# Patient Record
Sex: Male | Born: 1945
Health system: Southern US, Community
[De-identification: ages and names within clinical notes are randomized; demographics above are authoritative.]

## PROBLEM LIST (undated history)

## (undated) DIAGNOSIS — I8500 Esophageal varices without bleeding: Secondary | ICD-10-CM

## (undated) DIAGNOSIS — N3281 Overactive bladder: Secondary | ICD-10-CM

## (undated) DIAGNOSIS — I10 Essential (primary) hypertension: Secondary | ICD-10-CM

## (undated) DIAGNOSIS — I517 Cardiomegaly: Secondary | ICD-10-CM

## (undated) DIAGNOSIS — N62 Hypertrophy of breast: Secondary | ICD-10-CM

## (undated) DIAGNOSIS — R161 Splenomegaly, not elsewhere classified: Secondary | ICD-10-CM

## (undated) DIAGNOSIS — E039 Hypothyroidism, unspecified: Secondary | ICD-10-CM

## (undated) DIAGNOSIS — E785 Hyperlipidemia, unspecified: Secondary | ICD-10-CM

## (undated) DIAGNOSIS — K7460 Unspecified cirrhosis of liver: Secondary | ICD-10-CM

## (undated) DIAGNOSIS — E119 Type 2 diabetes mellitus without complications: Secondary | ICD-10-CM

## (undated) DIAGNOSIS — N529 Male erectile dysfunction, unspecified: Secondary | ICD-10-CM

## (undated) DIAGNOSIS — D696 Thrombocytopenia, unspecified: Secondary | ICD-10-CM

## (undated) DIAGNOSIS — D649 Anemia, unspecified: Secondary | ICD-10-CM

## (undated) DIAGNOSIS — I8290 Acute embolism and thrombosis of unspecified vein: Secondary | ICD-10-CM

## (undated) DIAGNOSIS — Z9289 Personal history of other medical treatment: Secondary | ICD-10-CM

## (undated) DIAGNOSIS — N400 Enlarged prostate without lower urinary tract symptoms: Secondary | ICD-10-CM

## (undated) DIAGNOSIS — K219 Gastro-esophageal reflux disease without esophagitis: Secondary | ICD-10-CM

## (undated) DIAGNOSIS — I2510 Atherosclerotic heart disease of native coronary artery without angina pectoris: Secondary | ICD-10-CM

## (undated) HISTORY — DX: Unspecified cirrhosis of liver: K74.60

## (undated) HISTORY — DX: Thrombocytopenia, unspecified: D69.6

## (undated) HISTORY — DX: Male erectile dysfunction, unspecified: N52.9

## (undated) HISTORY — PX: ROTATOR CUFF REPAIR: SHX139

## (undated) HISTORY — DX: Anemia, unspecified: D64.9

## (undated) HISTORY — DX: Overactive bladder: N32.81

## (undated) HISTORY — DX: Gastro-esophageal reflux disease without esophagitis: K21.9

## (undated) HISTORY — DX: Atherosclerotic heart disease of native coronary artery without angina pectoris: I25.10

## (undated) HISTORY — DX: Personal history of other medical treatment: Z92.89

## (undated) HISTORY — DX: Esophageal varices without bleeding: I85.00

## (undated) HISTORY — DX: Splenomegaly, not elsewhere classified: R16.1

## (undated) HISTORY — DX: Hypothyroidism, unspecified: E03.9

## (undated) HISTORY — DX: Hyperlipidemia, unspecified: E78.5

## (undated) HISTORY — DX: Type 2 diabetes mellitus without complications: E11.9

## (undated) HISTORY — DX: Hypertrophy of breast: N62

## (undated) HISTORY — DX: Acute embolism and thrombosis of unspecified vein: I82.90

---

## 2002-01-16 ENCOUNTER — Encounter: Admission: RE | Admit: 2002-01-16 | Discharge: 2002-04-16 | Payer: Self-pay | Admitting: Family Medicine

## 2002-01-23 ENCOUNTER — Encounter (HOSPITAL_COMMUNITY): Admission: RE | Admit: 2002-01-23 | Discharge: 2002-02-22 | Payer: Self-pay | Admitting: Oncology

## 2002-01-23 ENCOUNTER — Encounter (INDEPENDENT_AMBULATORY_CARE_PROVIDER_SITE_OTHER): Payer: Self-pay | Admitting: Internal Medicine

## 2002-01-23 ENCOUNTER — Encounter: Admission: RE | Admit: 2002-01-23 | Discharge: 2002-01-23 | Payer: Self-pay | Admitting: Oncology

## 2002-01-28 ENCOUNTER — Encounter (HOSPITAL_COMMUNITY): Payer: Self-pay | Admitting: Oncology

## 2002-03-07 DIAGNOSIS — I2510 Atherosclerotic heart disease of native coronary artery without angina pectoris: Secondary | ICD-10-CM

## 2002-03-07 HISTORY — PX: CORONARY ARTERY BYPASS GRAFT: SHX141

## 2002-03-07 HISTORY — DX: Atherosclerotic heart disease of native coronary artery without angina pectoris: I25.10

## 2002-03-18 ENCOUNTER — Encounter (HOSPITAL_COMMUNITY): Admission: RE | Admit: 2002-03-18 | Discharge: 2002-04-17 | Payer: Self-pay | Admitting: Oncology

## 2002-03-18 ENCOUNTER — Encounter: Admission: RE | Admit: 2002-03-18 | Discharge: 2002-03-18 | Payer: Self-pay | Admitting: Oncology

## 2002-04-26 ENCOUNTER — Ambulatory Visit (HOSPITAL_COMMUNITY): Admission: RE | Admit: 2002-04-26 | Discharge: 2002-04-26 | Payer: Self-pay | Admitting: Internal Medicine

## 2002-05-13 ENCOUNTER — Ambulatory Visit (HOSPITAL_COMMUNITY): Admission: RE | Admit: 2002-05-13 | Discharge: 2002-05-13 | Payer: Self-pay | Admitting: Internal Medicine

## 2002-05-13 ENCOUNTER — Encounter: Payer: Self-pay | Admitting: Internal Medicine

## 2002-06-11 ENCOUNTER — Encounter: Admission: RE | Admit: 2002-06-11 | Discharge: 2002-09-09 | Payer: Self-pay | Admitting: Family Medicine

## 2002-10-01 ENCOUNTER — Encounter (INDEPENDENT_AMBULATORY_CARE_PROVIDER_SITE_OTHER): Payer: Self-pay | Admitting: Internal Medicine

## 2002-10-03 ENCOUNTER — Inpatient Hospital Stay (HOSPITAL_COMMUNITY): Admission: AD | Admit: 2002-10-03 | Discharge: 2002-10-05 | Payer: Self-pay | Admitting: *Deleted

## 2002-10-15 ENCOUNTER — Inpatient Hospital Stay (HOSPITAL_COMMUNITY)
Admission: RE | Admit: 2002-10-15 | Discharge: 2002-10-19 | Payer: Self-pay | Admitting: Thoracic Surgery (Cardiothoracic Vascular Surgery)

## 2002-10-15 ENCOUNTER — Encounter: Payer: Self-pay | Admitting: Thoracic Surgery (Cardiothoracic Vascular Surgery)

## 2002-10-16 ENCOUNTER — Encounter: Payer: Self-pay | Admitting: Thoracic Surgery (Cardiothoracic Vascular Surgery)

## 2002-10-17 ENCOUNTER — Encounter: Payer: Self-pay | Admitting: Thoracic Surgery (Cardiothoracic Vascular Surgery)

## 2004-02-16 ENCOUNTER — Ambulatory Visit (HOSPITAL_COMMUNITY): Admission: RE | Admit: 2004-02-16 | Discharge: 2004-02-16 | Payer: Self-pay | Admitting: Family Medicine

## 2005-03-25 ENCOUNTER — Ambulatory Visit: Payer: Self-pay | Admitting: *Deleted

## 2005-05-12 ENCOUNTER — Inpatient Hospital Stay (HOSPITAL_COMMUNITY): Admission: EM | Admit: 2005-05-12 | Discharge: 2005-05-17 | Payer: Self-pay | Admitting: Emergency Medicine

## 2005-05-16 ENCOUNTER — Ambulatory Visit: Payer: Self-pay | Admitting: Internal Medicine

## 2005-05-24 ENCOUNTER — Ambulatory Visit: Payer: Self-pay | Admitting: Internal Medicine

## 2005-05-24 ENCOUNTER — Ambulatory Visit (HOSPITAL_COMMUNITY): Admission: RE | Admit: 2005-05-24 | Discharge: 2005-05-24 | Payer: Self-pay | Admitting: Internal Medicine

## 2005-05-27 ENCOUNTER — Ambulatory Visit: Payer: Self-pay | Admitting: Internal Medicine

## 2005-05-30 ENCOUNTER — Ambulatory Visit: Payer: Self-pay | Admitting: Internal Medicine

## 2005-05-30 ENCOUNTER — Ambulatory Visit: Payer: Self-pay | Admitting: *Deleted

## 2005-05-30 ENCOUNTER — Ambulatory Visit (HOSPITAL_COMMUNITY): Admission: RE | Admit: 2005-05-30 | Discharge: 2005-05-30 | Payer: Self-pay | Admitting: Internal Medicine

## 2005-05-31 ENCOUNTER — Ambulatory Visit: Payer: Self-pay | Admitting: Internal Medicine

## 2005-06-03 ENCOUNTER — Ambulatory Visit: Payer: Self-pay | Admitting: Internal Medicine

## 2005-06-08 ENCOUNTER — Encounter (INDEPENDENT_AMBULATORY_CARE_PROVIDER_SITE_OTHER): Payer: Self-pay | Admitting: Internal Medicine

## 2005-06-08 ENCOUNTER — Ambulatory Visit: Payer: Self-pay | Admitting: *Deleted

## 2005-06-21 ENCOUNTER — Ambulatory Visit (HOSPITAL_COMMUNITY): Admission: RE | Admit: 2005-06-21 | Discharge: 2005-06-21 | Payer: Self-pay | Admitting: *Deleted

## 2005-06-21 ENCOUNTER — Ambulatory Visit: Payer: Self-pay | Admitting: *Deleted

## 2005-06-22 ENCOUNTER — Ambulatory Visit (HOSPITAL_COMMUNITY): Admission: RE | Admit: 2005-06-22 | Discharge: 2005-06-22 | Payer: Self-pay | Admitting: Internal Medicine

## 2005-06-22 ENCOUNTER — Encounter (INDEPENDENT_AMBULATORY_CARE_PROVIDER_SITE_OTHER): Payer: Self-pay | Admitting: Internal Medicine

## 2005-06-23 ENCOUNTER — Ambulatory Visit: Payer: Self-pay | Admitting: *Deleted

## 2005-06-23 ENCOUNTER — Encounter (INDEPENDENT_AMBULATORY_CARE_PROVIDER_SITE_OTHER): Payer: Self-pay | Admitting: Internal Medicine

## 2005-06-24 ENCOUNTER — Ambulatory Visit: Payer: Self-pay | Admitting: Internal Medicine

## 2005-07-14 ENCOUNTER — Ambulatory Visit: Payer: Self-pay | Admitting: Internal Medicine

## 2005-08-11 ENCOUNTER — Ambulatory Visit: Payer: Self-pay | Admitting: Internal Medicine

## 2005-09-15 ENCOUNTER — Ambulatory Visit: Payer: Self-pay | Admitting: Internal Medicine

## 2005-09-15 LAB — CONVERTED CEMR LAB
Microalb Creat Ratio: 20.6 mg/g
Microalb, Ur: 2.42 mg/dL

## 2005-09-28 ENCOUNTER — Ambulatory Visit: Payer: Self-pay | Admitting: *Deleted

## 2005-10-27 ENCOUNTER — Ambulatory Visit: Payer: Self-pay | Admitting: Internal Medicine

## 2005-11-15 ENCOUNTER — Encounter (INDEPENDENT_AMBULATORY_CARE_PROVIDER_SITE_OTHER): Payer: Self-pay | Admitting: Internal Medicine

## 2005-11-23 ENCOUNTER — Ambulatory Visit: Payer: Self-pay | Admitting: Internal Medicine

## 2005-11-24 ENCOUNTER — Ambulatory Visit (HOSPITAL_COMMUNITY): Admission: RE | Admit: 2005-11-24 | Discharge: 2005-11-24 | Payer: Self-pay | Admitting: Internal Medicine

## 2005-11-24 ENCOUNTER — Ambulatory Visit: Payer: Self-pay | Admitting: Internal Medicine

## 2005-12-01 ENCOUNTER — Encounter (INDEPENDENT_AMBULATORY_CARE_PROVIDER_SITE_OTHER): Payer: Self-pay | Admitting: Internal Medicine

## 2005-12-14 ENCOUNTER — Encounter (INDEPENDENT_AMBULATORY_CARE_PROVIDER_SITE_OTHER): Payer: Self-pay | Admitting: Internal Medicine

## 2005-12-14 ENCOUNTER — Ambulatory Visit: Payer: Self-pay | Admitting: Cardiology

## 2005-12-22 ENCOUNTER — Ambulatory Visit: Payer: Self-pay | Admitting: Internal Medicine

## 2005-12-22 ENCOUNTER — Ambulatory Visit (HOSPITAL_COMMUNITY): Admission: RE | Admit: 2005-12-22 | Discharge: 2005-12-22 | Payer: Self-pay | Admitting: Internal Medicine

## 2005-12-22 ENCOUNTER — Encounter (INDEPENDENT_AMBULATORY_CARE_PROVIDER_SITE_OTHER): Payer: Self-pay | Admitting: Internal Medicine

## 2006-01-19 ENCOUNTER — Ambulatory Visit: Payer: Self-pay | Admitting: Internal Medicine

## 2006-01-19 LAB — CONVERTED CEMR LAB: Hgb A1c MFr Bld: 6.9 %

## 2006-02-01 DIAGNOSIS — I8500 Esophageal varices without bleeding: Secondary | ICD-10-CM | POA: Insufficient documentation

## 2006-02-01 DIAGNOSIS — E785 Hyperlipidemia, unspecified: Secondary | ICD-10-CM | POA: Insufficient documentation

## 2006-02-01 DIAGNOSIS — D649 Anemia, unspecified: Secondary | ICD-10-CM

## 2006-02-01 DIAGNOSIS — K7460 Unspecified cirrhosis of liver: Secondary | ICD-10-CM | POA: Insufficient documentation

## 2006-02-01 DIAGNOSIS — R161 Splenomegaly, not elsewhere classified: Secondary | ICD-10-CM

## 2006-02-01 DIAGNOSIS — K7200 Acute and subacute hepatic failure without coma: Secondary | ICD-10-CM

## 2006-02-01 DIAGNOSIS — E039 Hypothyroidism, unspecified: Secondary | ICD-10-CM | POA: Insufficient documentation

## 2006-02-01 DIAGNOSIS — I2510 Atherosclerotic heart disease of native coronary artery without angina pectoris: Secondary | ICD-10-CM | POA: Insufficient documentation

## 2006-03-27 ENCOUNTER — Ambulatory Visit: Payer: Self-pay | Admitting: Internal Medicine

## 2006-03-28 ENCOUNTER — Encounter (INDEPENDENT_AMBULATORY_CARE_PROVIDER_SITE_OTHER): Payer: Self-pay | Admitting: Internal Medicine

## 2006-03-28 LAB — CONVERTED CEMR LAB
AST: 30 units/L (ref 0–37)
Albumin: 3.5 g/dL (ref 3.5–5.2)
Alkaline Phosphatase: 76 units/L (ref 39–117)
BUN: 17 mg/dL (ref 6–23)
Basophils Relative: 0 % (ref 0–1)
CO2: 23 meq/L (ref 19–32)
Calcium: 8.8 mg/dL (ref 8.4–10.5)
Chloride: 103 meq/L (ref 96–112)
Cholesterol: 113 mg/dL (ref 0–200)
Eosinophils Relative: 2 % (ref 0–5)
Hemoglobin: 12.4 g/dL — ABNORMAL LOW (ref 13.0–17.0)
Lymphocytes Relative: 18 % (ref 12–46)
MCHC: 33.4 g/dL (ref 30.0–36.0)
MCV: 92.8 fL (ref 78.0–100.0)
Monocytes Absolute: 0.5 10*3/uL (ref 0.2–0.7)
Monocytes Relative: 9 % (ref 3–11)
RBC: 4 M/uL — ABNORMAL LOW (ref 4.22–5.81)
RDW: 15.3 % — ABNORMAL HIGH (ref 11.5–14.0)
Triglycerides: 55 mg/dL (ref <150)
VLDL: 11 mg/dL (ref 0–40)

## 2006-04-14 ENCOUNTER — Ambulatory Visit (HOSPITAL_COMMUNITY): Admission: RE | Admit: 2006-04-14 | Discharge: 2006-04-14 | Payer: Self-pay | Admitting: Internal Medicine

## 2006-04-14 ENCOUNTER — Ambulatory Visit: Payer: Self-pay | Admitting: Internal Medicine

## 2006-04-24 ENCOUNTER — Ambulatory Visit: Payer: Self-pay | Admitting: Internal Medicine

## 2006-04-24 DIAGNOSIS — F528 Other sexual dysfunction not due to a substance or known physiological condition: Secondary | ICD-10-CM

## 2006-04-24 LAB — CONVERTED CEMR LAB: Hgb A1c MFr Bld: 7.3 %

## 2006-05-11 ENCOUNTER — Encounter (INDEPENDENT_AMBULATORY_CARE_PROVIDER_SITE_OTHER): Payer: Self-pay | Admitting: Internal Medicine

## 2006-05-12 LAB — CONVERTED CEMR LAB
Albumin: 3.5 g/dL (ref 3.5–5.2)
Total Bilirubin: 1.1 mg/dL (ref 0.3–1.2)
Total Protein: 7.2 g/dL (ref 6.0–8.3)

## 2006-05-24 ENCOUNTER — Encounter (INDEPENDENT_AMBULATORY_CARE_PROVIDER_SITE_OTHER): Payer: Self-pay | Admitting: *Deleted

## 2006-07-10 ENCOUNTER — Ambulatory Visit: Payer: Self-pay | Admitting: Internal Medicine

## 2006-07-10 LAB — CONVERTED CEMR LAB: Hgb A1c MFr Bld: 7.3 %

## 2006-07-11 ENCOUNTER — Encounter (INDEPENDENT_AMBULATORY_CARE_PROVIDER_SITE_OTHER): Payer: Self-pay | Admitting: Internal Medicine

## 2006-07-11 LAB — CONVERTED CEMR LAB
AST: 36 units/L (ref 0–37)
Alkaline Phosphatase: 101 units/L (ref 39–117)
Basophils Absolute: 0 10*3/uL (ref 0.0–0.1)
Basophils Relative: 0 % (ref 0–1)
Glucose, Bld: 172 mg/dL — ABNORMAL HIGH (ref 70–99)
HCT: 36.7 % — ABNORMAL LOW (ref 39.0–52.0)
Lymphocytes Relative: 21 % (ref 12–46)
Monocytes Absolute: 0.6 10*3/uL (ref 0.2–0.7)
Neutro Abs: 3.1 10*3/uL (ref 1.7–7.7)
Neutrophils Relative %: 65 % (ref 43–77)
Platelets: 65 10*3/uL — ABNORMAL LOW (ref 150–400)
RBC: 4.06 M/uL — ABNORMAL LOW (ref 4.22–5.81)
RDW: 15.4 % — ABNORMAL HIGH (ref 11.5–14.0)
TSH: 10.221 microintl units/mL — ABNORMAL HIGH (ref 0.350–5.50)
Total Bilirubin: 1.2 mg/dL (ref 0.3–1.2)
Total Protein: 7 g/dL (ref 6.0–8.3)

## 2006-07-14 ENCOUNTER — Ambulatory Visit: Payer: Self-pay | Admitting: Cardiology

## 2006-07-14 ENCOUNTER — Encounter (INDEPENDENT_AMBULATORY_CARE_PROVIDER_SITE_OTHER): Payer: Self-pay | Admitting: Internal Medicine

## 2006-07-17 ENCOUNTER — Encounter (INDEPENDENT_AMBULATORY_CARE_PROVIDER_SITE_OTHER): Payer: Self-pay | Admitting: Internal Medicine

## 2006-07-17 ENCOUNTER — Ambulatory Visit: Payer: Self-pay | Admitting: Internal Medicine

## 2006-07-20 ENCOUNTER — Ambulatory Visit (HOSPITAL_COMMUNITY): Admission: RE | Admit: 2006-07-20 | Discharge: 2006-07-20 | Payer: Self-pay | Admitting: Cardiology

## 2006-07-20 ENCOUNTER — Ambulatory Visit (HOSPITAL_COMMUNITY): Admission: RE | Admit: 2006-07-20 | Discharge: 2006-07-20 | Payer: Self-pay | Admitting: Internal Medicine

## 2006-08-23 ENCOUNTER — Ambulatory Visit (HOSPITAL_COMMUNITY): Admission: RE | Admit: 2006-08-23 | Discharge: 2006-08-23 | Payer: Self-pay | Admitting: Internal Medicine

## 2006-10-09 HISTORY — PX: CARDIAC CATHETERIZATION: SHX172

## 2006-10-10 ENCOUNTER — Encounter (INDEPENDENT_AMBULATORY_CARE_PROVIDER_SITE_OTHER): Payer: Self-pay | Admitting: Internal Medicine

## 2006-10-11 ENCOUNTER — Ambulatory Visit: Payer: Self-pay | Admitting: Internal Medicine

## 2006-10-11 LAB — CONVERTED CEMR LAB: Hgb A1c MFr Bld: 7.4 %

## 2006-10-12 ENCOUNTER — Ambulatory Visit: Payer: Self-pay | Admitting: Internal Medicine

## 2006-11-01 ENCOUNTER — Telehealth (INDEPENDENT_AMBULATORY_CARE_PROVIDER_SITE_OTHER): Payer: Self-pay | Admitting: Internal Medicine

## 2006-11-07 ENCOUNTER — Encounter (INDEPENDENT_AMBULATORY_CARE_PROVIDER_SITE_OTHER): Payer: Self-pay | Admitting: Internal Medicine

## 2006-12-04 ENCOUNTER — Ambulatory Visit: Payer: Self-pay | Admitting: Internal Medicine

## 2006-12-04 DIAGNOSIS — L570 Actinic keratosis: Secondary | ICD-10-CM | POA: Insufficient documentation

## 2006-12-04 LAB — CONVERTED CEMR LAB: Creatinine, Urine: 151.4 mg/dL

## 2007-01-01 ENCOUNTER — Encounter (INDEPENDENT_AMBULATORY_CARE_PROVIDER_SITE_OTHER): Payer: Self-pay | Admitting: Internal Medicine

## 2007-01-08 ENCOUNTER — Ambulatory Visit (HOSPITAL_COMMUNITY): Admission: RE | Admit: 2007-01-08 | Discharge: 2007-01-08 | Payer: Self-pay | Admitting: Internal Medicine

## 2007-01-08 ENCOUNTER — Ambulatory Visit: Payer: Self-pay | Admitting: Internal Medicine

## 2007-01-08 DIAGNOSIS — R17 Unspecified jaundice: Secondary | ICD-10-CM | POA: Insufficient documentation

## 2007-01-08 DIAGNOSIS — R109 Unspecified abdominal pain: Secondary | ICD-10-CM

## 2007-01-08 LAB — CONVERTED CEMR LAB
Albumin: 3.3 g/dL — ABNORMAL LOW (ref 3.5–5.2)
BUN: 15 mg/dL (ref 6–23)
Basophils Absolute: 0 10*3/uL (ref 0.0–0.1)
Basophils Relative: 0 % (ref 0–1)
CO2: 27 meq/L (ref 19–32)
Eosinophils Absolute: 0.2 10*3/uL (ref 0.0–0.7)
Glucose, Bld: 190 mg/dL — ABNORMAL HIGH (ref 70–99)
HCT: 36.3 % — ABNORMAL LOW (ref 39.0–52.0)
INR: 1.3 (ref 0.0–1.5)
MCHC: 35.4 g/dL (ref 30.0–36.0)
MCV: 95 fL (ref 78.0–100.0)
Monocytes Absolute: 0.8 10*3/uL — ABNORMAL HIGH (ref 0.2–0.7)
Neutrophils Relative %: 74 % (ref 43–77)
Platelets: 63 10*3/uL — ABNORMAL LOW (ref 150–400)
Potassium: 4.1 meq/L (ref 3.5–5.3)
Prothrombin Time: 17 s — ABNORMAL HIGH (ref 11.6–15.2)
RBC: 3.82 M/uL — ABNORMAL LOW (ref 4.22–5.81)
RDW: 13.9 % (ref 11.5–14.0)
Total Bilirubin: 2.9 mg/dL — ABNORMAL HIGH (ref 0.3–1.2)
Vitamin B-12: 340 pg/mL (ref 211–911)
WBC: 7.4 10*3/uL (ref 4.0–10.5)

## 2007-01-10 ENCOUNTER — Encounter (INDEPENDENT_AMBULATORY_CARE_PROVIDER_SITE_OTHER): Payer: Self-pay | Admitting: Internal Medicine

## 2007-01-11 ENCOUNTER — Ambulatory Visit: Payer: Self-pay | Admitting: Internal Medicine

## 2007-01-11 LAB — CONVERTED CEMR LAB: Blood Glucose, Fingerstick: 173

## 2007-01-12 ENCOUNTER — Telehealth (INDEPENDENT_AMBULATORY_CARE_PROVIDER_SITE_OTHER): Payer: Self-pay | Admitting: *Deleted

## 2007-01-12 LAB — CONVERTED CEMR LAB
AST: 31 units/L (ref 0–37)
Albumin: 3.5 g/dL (ref 3.5–5.2)
Bilirubin, Direct: 0.4 mg/dL — ABNORMAL HIGH (ref 0.0–0.3)
Indirect Bilirubin: 1 mg/dL — ABNORMAL HIGH (ref 0.0–0.9)
Total Protein: 6.8 g/dL (ref 6.0–8.3)

## 2007-01-15 ENCOUNTER — Encounter (INDEPENDENT_AMBULATORY_CARE_PROVIDER_SITE_OTHER): Payer: Self-pay | Admitting: Internal Medicine

## 2007-03-13 ENCOUNTER — Ambulatory Visit: Payer: Self-pay | Admitting: Internal Medicine

## 2007-03-13 LAB — CONVERTED CEMR LAB
Blood Glucose, Fingerstick: 143
INR: 1.4 (ref 0.0–1.5)
Prothrombin Time: 17.8 s — ABNORMAL HIGH (ref 11.6–15.2)

## 2007-03-14 ENCOUNTER — Telehealth (INDEPENDENT_AMBULATORY_CARE_PROVIDER_SITE_OTHER): Payer: Self-pay | Admitting: *Deleted

## 2007-03-14 LAB — CONVERTED CEMR LAB
Albumin: 3.7 g/dL (ref 3.5–5.2)
Alkaline Phosphatase: 62 units/L (ref 39–117)
BUN: 23 mg/dL (ref 6–23)
Basophils Absolute: 0 10*3/uL (ref 0.0–0.1)
Basophils Relative: 0 % (ref 0–1)
CO2: 20 meq/L (ref 19–32)
Calcium: 9.5 mg/dL (ref 8.4–10.5)
Cholesterol: 130 mg/dL (ref 0–200)
Creatinine, Ser: 0.92 mg/dL (ref 0.40–1.50)
Eosinophils Relative: 2 % (ref 0–5)
HCT: 37.8 % — ABNORMAL LOW (ref 39.0–52.0)
HDL: 34 mg/dL — ABNORMAL LOW (ref >39)
LDL Cholesterol: 76 mg/dL (ref 0–99)
MCHC: 36 g/dL (ref 30.0–36.0)
Monocytes Absolute: 0.4 10*3/uL (ref 0.1–1.0)
Neutrophils Relative %: 63 % (ref 43–77)
Potassium: 4.8 meq/L (ref 3.5–5.3)
RBC: 4.08 M/uL — ABNORMAL LOW (ref 4.22–5.81)
Triglycerides: 102 mg/dL (ref <150)

## 2007-03-15 ENCOUNTER — Encounter (INDEPENDENT_AMBULATORY_CARE_PROVIDER_SITE_OTHER): Payer: Self-pay | Admitting: Internal Medicine

## 2007-05-03 ENCOUNTER — Encounter (INDEPENDENT_AMBULATORY_CARE_PROVIDER_SITE_OTHER): Payer: Self-pay | Admitting: Internal Medicine

## 2007-05-03 ENCOUNTER — Ambulatory Visit: Payer: Self-pay | Admitting: Internal Medicine

## 2007-05-08 ENCOUNTER — Ambulatory Visit (HOSPITAL_COMMUNITY): Admission: RE | Admit: 2007-05-08 | Discharge: 2007-05-08 | Payer: Self-pay | Admitting: Internal Medicine

## 2007-05-11 ENCOUNTER — Ambulatory Visit: Payer: Self-pay | Admitting: Internal Medicine

## 2007-05-11 DIAGNOSIS — M6530 Trigger finger, unspecified finger: Secondary | ICD-10-CM | POA: Insufficient documentation

## 2007-05-11 LAB — CONVERTED CEMR LAB: Hgb A1c MFr Bld: 7.5 %

## 2007-08-09 ENCOUNTER — Ambulatory Visit: Payer: Self-pay | Admitting: Internal Medicine

## 2007-08-09 LAB — CONVERTED CEMR LAB
Hgb A1c MFr Bld: 7.3 %
Prothrombin Time: 18.1 s — ABNORMAL HIGH (ref 11.6–15.2)
aPTT: 38 s — ABNORMAL HIGH (ref 24–37)

## 2007-08-10 LAB — CONVERTED CEMR LAB
ALT: 20 units/L (ref 0–53)
AST: 36 units/L (ref 0–37)
Alkaline Phosphatase: 75 units/L (ref 39–117)
CO2: 18 meq/L — ABNORMAL LOW (ref 19–32)
Chloride: 106 meq/L (ref 96–112)
Creatinine, Ser: 0.89 mg/dL (ref 0.40–1.50)
Hemoglobin: 13.3 g/dL (ref 13.0–17.0)
LDL Cholesterol: 77 mg/dL (ref 0–99)
Lymphocytes Relative: 14 % (ref 12–46)
MCHC: 35.7 g/dL (ref 30.0–36.0)
Neutrophils Relative %: 77 % (ref 43–77)
Potassium: 4.9 meq/L (ref 3.5–5.3)
RDW: 14.9 % (ref 11.5–15.5)
Total Protein: 7.6 g/dL (ref 6.0–8.3)

## 2007-11-08 ENCOUNTER — Ambulatory Visit: Payer: Self-pay | Admitting: Internal Medicine

## 2007-11-08 LAB — CONVERTED CEMR LAB
Blood Glucose, Fingerstick: 155
Hgb A1c MFr Bld: 7.3 %

## 2007-11-16 LAB — CONVERTED CEMR LAB
AFP-Tumor Marker: 2.4 ng/mL (ref 0.0–8.0)
ALT: 20 U/L (ref 0–53)
AST: 34 U/L (ref 0–37)
Albumin: 3.6 g/dL (ref 3.5–5.2)
Alkaline Phosphatase: 89 U/L (ref 39–117)
BUN: 18 mg/dL (ref 6–23)
CO2: 21 meq/L (ref 19–32)
Calcium: 9 mg/dL (ref 8.4–10.5)
Chloride: 111 meq/L (ref 96–112)
Creatinine, Ser: 0.87 mg/dL (ref 0.40–1.50)
Glucose, Bld: 142 mg/dL — ABNORMAL HIGH (ref 70–99)
Potassium: 4.6 meq/L (ref 3.5–5.3)
Sodium: 141 meq/L (ref 135–145)
Total Bilirubin: 1.6 mg/dL — ABNORMAL HIGH (ref 0.3–1.2)
Total Protein: 7.1 g/dL (ref 6.0–8.3)

## 2007-12-26 ENCOUNTER — Ambulatory Visit: Payer: Self-pay | Admitting: Internal Medicine

## 2008-02-07 ENCOUNTER — Ambulatory Visit: Payer: Self-pay | Admitting: Internal Medicine

## 2008-02-07 LAB — CONVERTED CEMR LAB: INR: 1.4 (ref 0.0–1.5)

## 2008-02-21 ENCOUNTER — Encounter (INDEPENDENT_AMBULATORY_CARE_PROVIDER_SITE_OTHER): Payer: Self-pay | Admitting: Internal Medicine

## 2008-05-05 LAB — CONVERTED CEMR LAB
Albumin: 3.8 g/dL (ref 3.5–5.2)
CO2: 24 meq/L (ref 19–32)
Cholesterol: 118 mg/dL (ref 0–200)
Eosinophils Relative: 3 % (ref 0–5)
Glucose, Bld: 191 mg/dL — ABNORMAL HIGH (ref 70–99)
HCT: 37.2 % — ABNORMAL LOW (ref 39.0–52.0)
Lymphocytes Relative: 20 % (ref 12–46)
Lymphs Abs: 1.2 10*3/uL (ref 0.7–4.0)
Microalb Creat Ratio: 11.7 mg/g (ref 0.0–30.0)
Platelets: 81 10*3/uL — ABNORMAL LOW (ref 150–400)
Potassium: 4.5 meq/L (ref 3.5–5.3)
RBC: 3.99 M/uL — ABNORMAL LOW (ref 4.22–5.81)
Sodium: 138 meq/L (ref 135–145)
Total Protein: 7.5 g/dL (ref 6.0–8.3)
Triglycerides: 60 mg/dL (ref <150)
WBC: 5.8 10*3/uL (ref 4.0–10.5)

## 2008-05-08 ENCOUNTER — Ambulatory Visit: Payer: Self-pay | Admitting: Internal Medicine

## 2008-05-08 DIAGNOSIS — L821 Other seborrheic keratosis: Secondary | ICD-10-CM

## 2008-05-08 LAB — CONVERTED CEMR LAB: Blood Glucose, Fingerstick: 171

## 2008-08-06 ENCOUNTER — Ambulatory Visit: Payer: Self-pay | Admitting: Internal Medicine

## 2008-08-06 LAB — CONVERTED CEMR LAB: Blood Glucose, Fingerstick: 176

## 2008-08-07 LAB — CONVERTED CEMR LAB
Alkaline Phosphatase: 86 units/L (ref 39–117)
Basophils Relative: 0 % (ref 0–1)
Creatinine, Ser: 0.81 mg/dL (ref 0.40–1.50)
Glucose, Bld: 171 mg/dL — ABNORMAL HIGH (ref 70–99)
LDL Cholesterol: 47 mg/dL (ref 0–99)
Lymphs Abs: 1.1 10*3/uL (ref 0.7–4.0)
Monocytes Relative: 8 % (ref 3–12)
Neutro Abs: 3.7 10*3/uL (ref 1.7–7.7)
Neutrophils Relative %: 68 % (ref 43–77)
Prothrombin Time: 18.3 s — ABNORMAL HIGH (ref 11.6–15.2)
RBC: 3.89 M/uL — ABNORMAL LOW (ref 4.22–5.81)
Sodium: 140 meq/L (ref 135–145)
Total Bilirubin: 2.2 mg/dL — ABNORMAL HIGH (ref 0.3–1.2)
Total CHOL/HDL Ratio: 2.3
Total Protein: 7.4 g/dL (ref 6.0–8.3)
Triglycerides: 75 mg/dL (ref <150)
VLDL: 15 mg/dL (ref 0–40)
WBC: 5.4 10*3/uL (ref 4.0–10.5)
aPTT: 38 s — ABNORMAL HIGH (ref 24–37)

## 2008-08-29 ENCOUNTER — Encounter (INDEPENDENT_AMBULATORY_CARE_PROVIDER_SITE_OTHER): Payer: Self-pay | Admitting: Internal Medicine

## 2008-11-07 ENCOUNTER — Ambulatory Visit: Payer: Self-pay | Admitting: Internal Medicine

## 2008-11-07 DIAGNOSIS — E079 Disorder of thyroid, unspecified: Secondary | ICD-10-CM | POA: Insufficient documentation

## 2008-11-07 DIAGNOSIS — E119 Type 2 diabetes mellitus without complications: Secondary | ICD-10-CM | POA: Insufficient documentation

## 2008-11-07 DIAGNOSIS — H60339 Swimmer's ear, unspecified ear: Secondary | ICD-10-CM | POA: Insufficient documentation

## 2008-11-07 LAB — CONVERTED CEMR LAB: Hgb A1c MFr Bld: 6.9 %

## 2009-07-23 ENCOUNTER — Ambulatory Visit (HOSPITAL_COMMUNITY): Admission: RE | Admit: 2009-07-23 | Discharge: 2009-07-23 | Payer: Self-pay | Admitting: Ophthalmology

## 2009-12-22 ENCOUNTER — Encounter: Payer: Self-pay | Admitting: Internal Medicine

## 2010-01-04 ENCOUNTER — Ambulatory Visit: Payer: Self-pay | Admitting: Internal Medicine

## 2010-01-05 ENCOUNTER — Ambulatory Visit: Payer: Self-pay | Admitting: Internal Medicine

## 2010-01-05 ENCOUNTER — Encounter: Payer: Self-pay | Admitting: Gastroenterology

## 2010-01-05 HISTORY — PX: ESOPHAGOGASTRODUODENOSCOPY: SHX1529

## 2010-01-05 HISTORY — PX: COLONOSCOPY: SHX174

## 2010-01-06 ENCOUNTER — Encounter: Payer: Self-pay | Admitting: Internal Medicine

## 2010-01-06 LAB — CONVERTED CEMR LAB
AFP-Tumor Marker: 2.4 ng/mL (ref 0.0–8.0)
Eosinophils Relative: 3 % (ref 0–5)
Ferritin: 129 ng/mL (ref 22–322)
HCT: 34.2 % — ABNORMAL LOW (ref 39.0–52.0)
Hemoglobin: 11.9 g/dL — ABNORMAL LOW (ref 13.0–17.0)
Lymphocytes Relative: 26 % (ref 12–46)
Lymphs Abs: 1.1 10*3/uL (ref 0.7–4.0)
Monocytes Absolute: 0.4 10*3/uL (ref 0.1–1.0)
Monocytes Relative: 9 % (ref 3–12)
Neutro Abs: 2.6 10*3/uL (ref 1.7–7.7)
RBC: 3.54 M/uL — ABNORMAL LOW (ref 4.22–5.81)
Saturation Ratios: 40 % (ref 20–55)
WBC: 4.2 10*3/uL (ref 4.0–10.5)

## 2010-01-14 ENCOUNTER — Ambulatory Visit (HOSPITAL_COMMUNITY): Admission: RE | Admit: 2010-01-14 | Discharge: 2010-01-14 | Payer: Self-pay | Admitting: Internal Medicine

## 2010-01-14 ENCOUNTER — Telehealth: Payer: Self-pay | Admitting: Gastroenterology

## 2010-01-25 ENCOUNTER — Ambulatory Visit: Payer: Self-pay | Admitting: Internal Medicine

## 2010-01-25 ENCOUNTER — Ambulatory Visit (HOSPITAL_COMMUNITY): Admission: RE | Admit: 2010-01-25 | Discharge: 2010-01-25 | Payer: Self-pay | Admitting: Internal Medicine

## 2010-04-06 ENCOUNTER — Encounter (INDEPENDENT_AMBULATORY_CARE_PROVIDER_SITE_OTHER): Payer: Self-pay | Admitting: *Deleted

## 2010-04-06 NOTE — Letter (Signed)
Summary: TCS/EGD ORDER  TCS/EGD ORDER   Imported By: Ave Filter 01/06/2010 15:01:34  _____________________________________________________________________  External Attachment:    Type:   Image     Comment:   External Document

## 2010-04-06 NOTE — Letter (Signed)
Summary: ABD U/S ORDER  ABD U/S ORDER   Imported By: Ave Filter 01/06/2010 15:02:13  _____________________________________________________________________  External Attachment:    Type:   Image     Comment:   External Document

## 2010-04-06 NOTE — Progress Notes (Signed)
Summary: abd u/s  ---- Converted from flag ---- ---- 01/14/2010 9:57 AM, R. Roetta Sessions MD, Caleen Essex wrote: if asx, nothing else to be done- he would be a HIGH risk surgical candidate  ---- 01/14/2010 9:26 AM, Leanna Battles. Sumer Moorehouse PA-C wrote: GB wall thick on u/s in setting of cirrhosis. Pt assymptomatic. U/S done for hepatoma surveillance only. Anything else need to be done? ------------------------------

## 2010-04-06 NOTE — Assessment & Plan Note (Signed)
Summary: DROPPED OFF STOOL/SS   Pt returned one iFOBT and it was positive.     Allergies: No Known Drug Allergies  Other Orders: Immuno-chemical Fecal Occult ([address])  Appended Document: DROPPED OFF STOOL/SS see lab addendum

## 2010-04-06 NOTE — Assessment & Plan Note (Signed)
Summary: HEMOGLOBIN LOW/LAW   Visit Type:  consult Referring Provider:  Dwana Melena Primary Care Provider:  Dwana Melena  Chief Complaint:  low hemoglobin/f/u cirrhosis.  History of Present Illness: [name] is a pleasant 65 y/o WM, with h/o NASH cirrhosis, who presents at request of Dr. Timothy Lasso hall for further evaluation of cirrhosis and anemia. He was last seen by our practice in 2/09. He has h/o portal HTN, extensive intra-abdominal and esophageal varices s/p serial EGDs with EBL obliteration. H/O SMV thrombosis, GERD, DM, CAD with CABG. He was seen on several occasions at [facility] for his cirrhosis. According to the patient, he has not been seen in a couple of years and he reports being told he is not transplant candidate due to other medical problems. Our last correspondence from [facility] was in 2008 and impression stated that CAD would be issue with transplant but there was also issue with his insurance not being accepted at that time.  Patient had recent labs with Dr. Margo Aye. His MET-7 was normal. H/H 11.7/33.3, MCV 95.4, Plt 47,000 (stable), Tbili 2.2, AP 73, AST 39, ALT 20, alb 3.5, TSH 0.986, HgbA1C 6.0.  He has regular BMs. No melena, brbpr. No abd pain. Good appetite. No heartburn. No dysphagia, n/v. Weight stable. Some lower extremity swelling. Walks about 2 hours in a row on concrete at work. Swelling better in am. No itching. No nosebleeds.   Current Medications (verified): 1)  Nadolol 20 Mg  Tabs (Nadolol) .Marland Kitchen.. 1 By Mouth Once Daily 2)  Synthroid 125 Mcg  Tabs (Levothyroxine Sodium) .Marland Kitchen.. 1 By Mouth Once Daily 3)  Lisinopril 2.5 Mg Tabs (Lisinopril) .Marland Kitchen.. 1 By Mouth Once Daily 4)  Spironolactone 25 Mg Tabs (Spironolactone) .... 1/2 By Mouth Two Times A Day 5)  Isosorbide Mononitrate Cr 30 Mg Tb24 (Isosorbide Mononitrate) .Marland Kitchen.. 1 By Mouth Once Daily 6)  Januvia 100 Mg Tabs (Sitagliptin Phosphate) .Marland Kitchen.. 1 By Mouth Once Daily 7)  Lipitor 10 Mg Tabs (Atorvastatin Calcium) .... 1/2 By Mouth Once  Daily 8)  Accu-Chek Compact Test Drum   Strp (Glucose Blood) .... Fsbs Four Times Daily 9)  Lantus Solostar 100 Unit/ml  Soln (Insulin Glargine) .Marland Kitchen.. 10 Units Subcutaneously At Bedtime, Titrate Up Per Physician Order To 50 Units If Needed  Allergies (verified): No Known Drug Allergies  Past History:  Past Surgical History: Last updated: 10/11/2006 Coronary artery bypass graft-2004 Rotator cuff repair, left-2004 EDG with variceal banding X 5 Cardiac Cath 10/09/06  Past Medical History: Anemia/thrombocytopenia Blood transfusion, hx Cirrhosis-end stage liver disease, NASH Coronary artery disease s/p CAGB 2004 Hyperlipidemia Hypothyroidism GERD Hypothyroidism NIIDM atherosclerosis overactive bladder erectile dysfunction right gynecomastia Vaccinated for Hep A and Hep B TCS 2/04--> scattered left-sided diverticula, prominent vascular congestion left colon, normal TI Last EGD 2/08, Dr. Isabella Stalling columns of grade 2 EVs, s/p band ligation, portal gastropathy  Family History: father-deceased-79-CVA mother-deceased 85-CHF brother-60-CABG, [place] No FH of liver, CRC.  children-    male-40    male-38    male-37    male-25  Social History: Married lives with second wife Former Smoker 1-2 ppd for 15 years quit age 44 Alcohol use-yes--less than 1 beer per month, never heavy drink Drug use-no Occupation: security work  Review of Systems General:  Denies fever, chills, sweats, anorexia, fatigue, weakness, and weight loss. Eyes:  Denies vision loss. ENT:  Denies nasal congestion, sore throat, hoarseness, and difficulty swallowing. CV:  Complains of peripheral edema; denies chest pains, angina, palpitations, and dyspnea on exertion. Resp:  Denies dyspnea at rest, dyspnea with exercise, cough, sputum, and wheezing. GI:  See HPI. GU:  Denies urinary burning and blood in urine. MS:  Denies joint pain / LOM. Derm:  Denies rash and itching. Neuro:  Denies weakness, frequent  headaches, memory loss, and confusion. Psych:  Denies depression and anxiety. Endo:  Denies unusual weight change. Heme:  Denies bruising and bleeding. Allergy:  Denies hives and rash.  Vital Signs:  Patient profile:   65 year old male Height:      67 inches Weight:      188 pounds BMI:     29.55 Temp:     97.6 degrees F oral Pulse rate:   68 / minute BP sitting:   124 / 78  (left arm) Cuff size:   regular  Vitals Entered By: Hendricks Limes LPN (January 04, 2010 9:35 AM)  Physical Exam  General:  Well developed, well nourished, no acute distress. Head:  Normocephalic and atraumatic. Eyes:  Conjunctivae pink, no scleral icterus.  Mouth:  Oropharyngeal mucosa moist, pink.  No lesions, erythema or exudate.    Neck:  Supple; no masses or thyromegaly. Lungs:  Clear throughout to auscultation. Heart:  Regular rate and rhythm; no murmurs, rubs,  or bruits. Abdomen:  Liver edge prominent. Spleen tip palpable? No masses, abd bruit, ascites. NT. No hernia. Extremities:  No clubbing, cyanosis, edema or deformities noted. Neurologic:  Alert and  oriented x4;  grossly normal neurologically. Skin:  Intact without significant lesions or rashes. Cervical Nodes:  No significant cervical adenopathy. Psych:  Alert and cooperative. Normal mood and affect.  Impression & Recommendations:  Problem # 1:  ANEMIA-NOS (ICD-285.9) Anemia and thrombocytopenia in setting of cirrhosis although H/H below the baseline we had in 2009. No overt GI bleeding. Cannot exclude occult GI bleeding. Check ifobt. Check iron studies. He is due for EGD with possible EV banding but will await ifobt results first. If positive or evidence of IDA, then consider TCS at same time.  Orders: T-CBC w/Diff [phone]) T-Ferritin [phone]) T-Iron [phone]) T-Iron Binding Capacity (TIBC) ([id number]) Consultation Level IV (13244)  Problem # 2:  CIRRHOSIS (ICD-571.5) NASH cirrhosis with h/o SMV thrombosis, esophageal  varices, portal gastropathy. He is overdue for surveillance testing. He states he was told he was not transplant candidate due to comorbidities but we did not receive correspondence stating that per say. I discussed the necessity that he f/u with hepatologist at least every six months, but he doesn't seem to understand the significance. Will check MELD labs, AFP. He will need imaging of liver in near future (await labs, likely due abd u/s).  Orders: T-CBC w/Diff ([id number]) T-PT (Prothrombin Time) (44034) T-AFP Tumor Markers ([id number]) Consultation Level IV (56433) I would like to thank Dr. Dwana Melena for allowing [place] to take part in the care of this nice patient.

## 2010-04-06 NOTE — Letter (Signed)
Summary: LABS FROM DR Margo Aye  LABS FROM DR HALL   Imported By: Rexene Alberts 12/22/2009 11:12:49  _____________________________________________________________________  External Attachment:    Type:   Image     Comment:   External Document

## 2010-04-11 ENCOUNTER — Emergency Department (HOSPITAL_COMMUNITY): Payer: BC Managed Care – PPO

## 2010-04-11 ENCOUNTER — Emergency Department (HOSPITAL_COMMUNITY)
Admission: EM | Admit: 2010-04-11 | Discharge: 2010-04-11 | Disposition: A | Discharge status: Home / Self Care | Payer: BC Managed Care – PPO | Attending: Emergency Medicine | Admitting: Emergency Medicine

## 2010-04-11 DIAGNOSIS — Z79899 Other long term (current) drug therapy: Secondary | ICD-10-CM | POA: Insufficient documentation

## 2010-04-11 DIAGNOSIS — R42 Dizziness and giddiness: Secondary | ICD-10-CM | POA: Insufficient documentation

## 2010-04-11 DIAGNOSIS — R5381 Other malaise: Secondary | ICD-10-CM | POA: Insufficient documentation

## 2010-04-11 DIAGNOSIS — E119 Type 2 diabetes mellitus without complications: Secondary | ICD-10-CM | POA: Insufficient documentation

## 2010-04-11 DIAGNOSIS — K7460 Unspecified cirrhosis of liver: Secondary | ICD-10-CM | POA: Insufficient documentation

## 2010-04-11 DIAGNOSIS — R4182 Altered mental status, unspecified: Secondary | ICD-10-CM | POA: Insufficient documentation

## 2010-04-11 DIAGNOSIS — N63 Unspecified lump in unspecified breast: Secondary | ICD-10-CM

## 2010-04-11 LAB — POCT CARDIAC MARKERS
CKMB, poc: <1 ng/mL — ABNORMAL LOW (ref 1.0–8.0)
Myoglobin, poc: 79.2 ng/mL (ref 12–200)
Troponin i, poc: <0.05 ng/mL (ref 0.00–0.09)

## 2010-04-11 LAB — URINALYSIS, ROUTINE W REFLEX MICROSCOPIC
Bilirubin Urine: NEGATIVE
Ketones, ur: NEGATIVE mg/dL
Nitrite: NEGATIVE
Protein, ur: NEGATIVE mg/dL
Urobilinogen, UA: 1 mg/dL (ref 0.0–1.0)

## 2010-04-11 LAB — BASIC METABOLIC PANEL
BUN: 10 mg/dL (ref 6–23)
CO2: 25 mEq/L (ref 19–32)
Chloride: 111 mEq/L (ref 96–112)
Creatinine, Ser: 0.85 mg/dL (ref 0.4–1.5)
Potassium: 3.6 mEq/L (ref 3.5–5.1)

## 2010-04-11 LAB — CBC
Hemoglobin: 11.9 g/dL — ABNORMAL LOW (ref 13.0–17.0)
MCH: 34.5 pg — ABNORMAL HIGH (ref 26.0–34.0)
MCHC: 38.1 g/dL — ABNORMAL HIGH (ref 30.0–36.0)
Platelets: 47 10*3/uL — ABNORMAL LOW (ref 150–400)

## 2010-04-11 LAB — DIFFERENTIAL
Basophils Absolute: 0 10*3/uL (ref 0.0–0.1)
Basophils Relative: 0 % (ref 0–1)
Eosinophils Absolute: 0.2 10*3/uL (ref 0.0–0.7)
Lymphocytes Relative: 20 % (ref 12–46)
Monocytes Absolute: 0.5 10*3/uL (ref 0.1–1.0)
Neutro Abs: 3.3 10*3/uL (ref 1.7–7.7)
Neutrophils Relative %: 68 % (ref 43–77)

## 2010-04-11 LAB — RAPID URINE DRUG SCREEN, HOSP PERFORMED
Amphetamines: NOT DETECTED
Benzodiazepines: NOT DETECTED
Opiates: NOT DETECTED
Tetrahydrocannabinol: NOT DETECTED

## 2010-04-11 LAB — GLUCOSE, CAPILLARY: Glucose-Capillary: 146 mg/dL — ABNORMAL HIGH (ref 70–99)

## 2010-04-14 NOTE — Letter (Signed)
Summary: Recall Office Visit  [facility]  [address]   [place], [place] [address]   Phone: [phone]  Fax: [phone]      April 06, 2010   URHO RIO [address] [facility] [address] [place], [place]  [address] March 01, 1946   Dear Mr. HOON,   According to our records, it is time for you to schedule a follow-up office visit with [place].   At your convenience, please call [phone] to schedule an office visit. If you have any questions, concerns, or feel that this letter is in error, we would appreciate your call.   Sincerely,    Diana Eves  [facility] Ph: [phone]   Fax: [phone]

## 2010-04-21 ENCOUNTER — Ambulatory Visit (HOSPITAL_COMMUNITY)
Admit: 2010-04-21 | Discharge: 2010-04-21 | Disposition: A | Discharge status: Home / Self Care | Payer: BC Managed Care – PPO | Attending: Internal Medicine | Admitting: Internal Medicine

## 2010-04-21 DIAGNOSIS — N644 Mastodynia: Secondary | ICD-10-CM | POA: Insufficient documentation

## 2010-04-21 DIAGNOSIS — N63 Unspecified lump in unspecified breast: Secondary | ICD-10-CM | POA: Insufficient documentation

## 2010-05-04 ENCOUNTER — Encounter: Payer: Self-pay | Admitting: Internal Medicine

## 2010-05-04 ENCOUNTER — Encounter: Payer: Self-pay | Admitting: Gastroenterology

## 2010-05-04 ENCOUNTER — Ambulatory Visit (INDEPENDENT_AMBULATORY_CARE_PROVIDER_SITE_OTHER): Payer: BC Managed Care – PPO | Admitting: Gastroenterology

## 2010-05-04 DIAGNOSIS — I8500 Esophageal varices without bleeding: Secondary | ICD-10-CM

## 2010-05-04 DIAGNOSIS — K7460 Unspecified cirrhosis of liver: Secondary | ICD-10-CM

## 2010-05-10 ENCOUNTER — Encounter: Payer: Self-pay | Admitting: Gastroenterology

## 2010-05-12 ENCOUNTER — Encounter: Payer: Self-pay | Admitting: Internal Medicine

## 2010-05-13 ENCOUNTER — Encounter: Payer: BC Managed Care – PPO | Admitting: Internal Medicine

## 2010-05-13 ENCOUNTER — Ambulatory Visit (HOSPITAL_COMMUNITY)
Admission: RE | Admit: 2010-05-13 | Discharge: 2010-05-13 | Disposition: A | Discharge status: Home / Self Care | Payer: BC Managed Care – PPO | Source: Ambulatory Visit | Attending: Internal Medicine | Admitting: Internal Medicine

## 2010-05-13 DIAGNOSIS — I8500 Esophageal varices without bleeding: Secondary | ICD-10-CM

## 2010-05-13 DIAGNOSIS — Z79899 Other long term (current) drug therapy: Secondary | ICD-10-CM | POA: Insufficient documentation

## 2010-05-13 DIAGNOSIS — E119 Type 2 diabetes mellitus without complications: Secondary | ICD-10-CM | POA: Insufficient documentation

## 2010-05-13 DIAGNOSIS — K319 Disease of stomach and duodenum, unspecified: Secondary | ICD-10-CM

## 2010-05-13 DIAGNOSIS — K7460 Unspecified cirrhosis of liver: Secondary | ICD-10-CM | POA: Insufficient documentation

## 2010-05-13 DIAGNOSIS — I8510 Secondary esophageal varices without bleeding: Secondary | ICD-10-CM | POA: Insufficient documentation

## 2010-05-13 DIAGNOSIS — Z794 Long term (current) use of insulin: Secondary | ICD-10-CM | POA: Insufficient documentation

## 2010-05-13 DIAGNOSIS — I10 Essential (primary) hypertension: Secondary | ICD-10-CM | POA: Insufficient documentation

## 2010-05-13 LAB — GLUCOSE, CAPILLARY: Glucose-Capillary: 118 mg/dL — ABNORMAL HIGH (ref 70–99)

## 2010-05-13 NOTE — Assessment & Plan Note (Addendum)
Summary: PP FU IN 3 MONTHS/LAW   Vital Signs:  Patient profile:   65 year old male Height:      67 inches Weight:      184.50 pounds BMI:     29.00 Temp:     98.3 degrees F oral Pulse rate:   64 / minute BP sitting:   120 / 58  (left arm)  Vitals Entered By: Nathan Clines LPN (May 04, 2010 9:02 AM)  Visit Type:  Follow-up Visit Primary Care Provider:  Dwana Melena   History of Present Illness: [name] presents today in f/u for NASH cirrhosis, anemia. Had an EGD/colon in Nov 2011 with 4 columns of 2-3 esophageal varices with banding, portal gastropathy, 2 non-bleeding AVMs in sigmoid, portal colopathy, with rec's to reassess UGI via EGD in 3 mos.  No dyspagia/odynophagia. No overt signs of GI bleed. BM daily. No abdominal pain. No jaundice, one episode of confusion 2/5, went to ED. no ammonia level drawn at that time; pt states he f/u with Dr. Margo Aye, who ordered an ammonia level.  Last [place] and AFP done Nov 2011; due for repeat [facility] screening in May 2012.   Labs from 04/11/10: H/H: 11.9/31.2, Plt 47, Na 140, K 3.6, Cr 0.85, BUN 10   Current Medications (verified): 1)  Nadolol 20 Mg  Tabs (Nadolol) .Marland Kitchen.. 1 By Mouth Once Daily 2)  Synthroid 125 Mcg  Tabs (Levothyroxine Sodium) .Marland Kitchen.. 1 By Mouth Once Daily 3)  Lisinopril 2.5 Mg Tabs (Lisinopril) .Marland Kitchen.. 1 By Mouth Once Daily 4)  Spironolactone 25 Mg Tabs (Spironolactone) .... 1/2 By Mouth Two Times A Day 5)  Isosorbide Mononitrate Cr 30 Mg Tb24 (Isosorbide Mononitrate) .Marland Kitchen.. 1 By Mouth Once Daily 6)  Januvia 100 Mg Tabs (Sitagliptin Phosphate) .Marland Kitchen.. 1 By Mouth Once Daily 7)  Lipitor 10 Mg Tabs (Atorvastatin Calcium) .... 1/2 By Mouth Once Daily 8)  Accu-Chek Compact Test Drum   Strp (Glucose Blood) .... Fsbs Four Times Daily 9)  Lantus Solostar 100 Unit/ml  Soln (Insulin Glargine) .Marland Kitchen.. 10 Units Subcutaneously At Bedtime, Titrate Up Per Physician Order To 50 Units If Needed  Allergies (verified): No Known Drug Allergies  Past History:  Past  Medical History: Anemia/thrombocytopenia Blood transfusion, hx Cirrhosis-end stage liver disease, NASH Coronary artery disease s/p CAGB 2004 Hyperlipidemia Hypothyroidism GERD Hypothyroidism NIIDM atherosclerosis overactive bladder erectile dysfunction right gynecomastia Vaccinated for Hep A and Hep B TCS 2/04--> scattered left-sided diverticula, prominent vascular congestion left colon, normal TI EGD 2/08, Dr. Isabella Stalling columns of grade 2 EVs, s/p band ligation, portal gastropathy Nov 2011 EGD/colon: 4 columns 2-3 grade esophageal varices, s/p banding, portal gastropathy, 2 non-bleeding AVMs in sigmoid, portal colopathy  Past Surgical History: Reviewed history from 10/11/2006 and no changes required. Coronary artery bypass graft-2004 Rotator cuff repair, left-2004 EDG with variceal banding X 5 Cardiac Cath 10/09/06  Family History: Reviewed history from 01/04/2010 and no changes required. father-deceased-79-CVA mother-deceased 85-CHF brother-60-CABG, [place] No FH of liver, CRC.  children-    male-40    male-38    male-37    male-25  Social History: Reviewed history from 01/04/2010 and no changes required. Married lives with second wife Former Smoker 1-2 ppd for 15 years quit age 48 Alcohol use-yes--less than 1 beer per month, never heavy drink Drug use-no Occupation: security work  Physical Exam  General:  Well developed, well nourished, no acute distress. Head:  Normocephalic and atraumatic. Eyes:  PERRLA, no icterus. Lungs:  Clear throughout to auscultation. Heart:  Regular rate and rhythm; no murmurs, rubs,  or bruits. Abdomen:  +BS, soft, non-tender, non-distended, no HSM, no rebound or guarding. Msk:  Symmetrical with no gross deformities. Normal posture. Neurologic:  Alert and  oriented x4;  grossly normal neurologically. Skin:  Intact without significant lesions or rashes. Psych:  Alert and cooperative. Normal mood and affect.   Impression &  Recommendations:  Problem # 1:  ESOPHAGEAL VARICES (ICD-77.62)  65 year old Caucasian male with hx of esophageal varices, most recent EGD in Nov 2011 with 4 columns of Grade 2-3 esophageal varices, s/p banding, portal gastropathy. Rec's to reassess via EGD in 3 mos. Denies abdominal pain, dysphagia/odynophagia. Hx of anemia with last H/H 11.9/31.2. Most recent colonoscopy in Nov 2011. No overt signs of GI bleed.  EGD with Dr. Jena Gauss in near future: the R/B/A have been discussed in detail; pt states understanding and desires to proceed Hold Januvia am of procedure; 1/2 dose of Lantus evening prior  Orders: Est. Patient Level II ([address])  Problem # 2:  CIRRHOSIS (ICD-571.5)  hx of NASH cirrhosis. No jaundice, abdominal pain. One episode of MS changes, confusion Apr 11, 2010, presented to [facility] ED. CT head negative. Ammonia level reportedly drawn by Dr. Margo Aye. Due for repeat HCC screen ([place], AFP) in May 2012.  Retrieve labs from Dr. Margo Aye: need CBC, CMP, PT/INR, ammonia level If any of the above not drawn, will need to have done [place] of abdomen, AFP in May 2012  Orders: Est. Patient Level II (60454)   Orders Added: 1)  Est. Patient Level II [09811]  Appended Document: PP FU IN 3 MONTHS/LAW received labs from 04/12/10: LFTs nl except for elevated Tbili at 2.1. PT/INR: 18.8/1.55, to be expected with hx of cirrhosis. ammonia: 55 (nl 11-35).  CBC 2/5: 11.9/31.2 (pretty much stable since Nov 2011). .   Pt is set up for EGD in the future. Please find out if taking lactulose. Let's get an updated ammonia level.   Appended Document: PP FU IN 3 MONTHS/LAW Pt says he is not taking Lactulose, he will go tomorrow for the Ammonia level. Order faxed to  [facility].

## 2010-05-13 NOTE — Letter (Signed)
Summary: EGD ORDER  EGD ORDER   Imported By: Ave Filter 05/04/2010 10:14:20  _____________________________________________________________________  External Attachment:    Type:   Image     Comment:   External Document

## 2010-05-17 NOTE — Op Note (Signed)
  NAME:  [name], PENNINGS NO.:  [id number]  MEDICAL RECORD NO.:  [id number]           PATIENT TYPE:  O  LOCATION:  DAYP                          FACILITY:  [facility]  PHYSICIAN:  R. Roetta Sessions, M.D. DATE OF BIRTH:  1945/06/28  DATE OF PROCEDURE:  05/13/2010 DATE OF DISCHARGE:                              OPERATIVE REPORT   PROCEDURE:  Esophagogastroduodenoscopy with esophageal band ligation and esophageal varices.  INDICATIONS FOR PROCEDURE:  A 65 year old gentleman with atherosis, known esophageal varices status post last band ligation session in November of last year.  He has been nadolol 20 mg orally daily. Clinically he has not had any GI bleeding.  He did have an episode of significant confusion last month in the setting of constipation and seen in the ED, recent serum ammonia 113.  He was prescribed, lactulose but has not started taking it as of yet, his mental status is cleared spontaneously.  EGD is now being done.  Potential risks, benefits, alternatives, and imponderables have been discussed, questions answered. Potential for esophageal band ligation reviewed.  Please see the documentation medical record.  PROCEDURE NOTE:  O2 saturation, blood pressure, pulse, and respirations were monitored throughout the entire procedure.  CONSCIOUS SEDATION:  Versed 4 mg IV and Demerol 50 mg IV in divided doses.  Cetacaine spray for topical pharyngeal anesthesia.  INSTRUMENT:  Pentax video chip system.  FINDINGS:  Examination of the tubular esophagus revealed 3 columns of grade II to III esophageal varices without bleeding stigmata. Otherwise, esophageal mucosa appeared normal.  EG junction was easily traversed.  Stomach:  Gastric cavity was emptied and insufflated well with air.  Thorough examination of the gastric mucosa including retroflex view of the proximal stomach esophagogastric junction demonstrated small hiatal hernia and diffuse snake skinning and  fish- scale appearance of the gastric mucosa consistent with portal gastropathy.  Pylorus was patent, easily traversed.  Examination of the bulb and second portion revealed no abnormalities. Therapeutic/diagnostic maneuvers performed.  The scope was withdrawn.  A 7-shot Microvasive Band ligator device was attached.  The scope was reintroduced into the esophagus and four bands were placed on 3 columns. Good hemostasis maintained with this maneuver.  The patient tolerated the procedure and was reactive in PACU.  IMPRESSION:  Three columns grade II to III esophageal varices status esophageal band ligation, small hiatal hernia, portal gastropathy, patent pylorus, normal D1 and D2.  RECOMMENDATIONS: 1. Continue nadolol. 2. We will set him up for repeat EGD in 3 months. 3. Proceed with obtaining a prescription for lactulose, would take 30     mL orally twice daily and titrated up to four times daily to     achieve 3 or 4 semi-formed bowel movements daily.  Plan is to see this nice gentleman back in the office in 6 weeks to assess his progress.     [name], M.D.     RMR/MEDQ  D:  05/13/2010  T:  05/13/2010  Job:  [id number]  cc:   Catalina Pizza, M.D. Fax: [phone]  Electronically Signed by Lorrin Goodell M.D. on 05/17/2010 10:29:00 AM

## 2010-05-18 LAB — GLUCOSE, CAPILLARY: Glucose-Capillary: 82 mg/dL (ref 70–99)

## 2010-05-18 NOTE — Miscellaneous (Signed)
Summary: Orders Update  Clinical Lists Changes  Orders: Added new Test order of T-Ammonia ([id number]) - Signed 

## 2010-05-25 LAB — BASIC METABOLIC PANEL
BUN: 15 mg/dL (ref 6–23)
CO2: 25 mEq/L (ref 19–32)
Calcium: 9.1 mg/dL (ref 8.4–10.5)
Chloride: 105 mEq/L (ref 96–112)
Creatinine, Ser: 0.82 mg/dL (ref 0.4–1.5)
GFR calc Af Amer: >60 mL/min (ref >60)
Glucose, Bld: 234 mg/dL — ABNORMAL HIGH (ref 70–99)

## 2010-06-01 ENCOUNTER — Encounter: Payer: Self-pay | Admitting: Gastroenterology

## 2010-06-24 ENCOUNTER — Ambulatory Visit (INDEPENDENT_AMBULATORY_CARE_PROVIDER_SITE_OTHER): Payer: BC Managed Care – PPO | Admitting: Gastroenterology

## 2010-06-24 ENCOUNTER — Other Ambulatory Visit: Payer: Self-pay | Admitting: Internal Medicine

## 2010-06-24 ENCOUNTER — Encounter: Payer: Self-pay | Admitting: Gastroenterology

## 2010-06-24 VITALS — BP 126/63 | HR 71 | Temp 98.7°F | Ht 67.0 in | Wt 185.6 lb

## 2010-06-24 DIAGNOSIS — K7460 Unspecified cirrhosis of liver: Secondary | ICD-10-CM

## 2010-06-24 DIAGNOSIS — I8500 Esophageal varices without bleeding: Secondary | ICD-10-CM

## 2010-06-24 DIAGNOSIS — D649 Anemia, unspecified: Secondary | ICD-10-CM

## 2010-06-24 DIAGNOSIS — C220 Liver cell carcinoma: Secondary | ICD-10-CM

## 2010-06-24 DIAGNOSIS — K7581 Nonalcoholic steatohepatitis (NASH): Secondary | ICD-10-CM

## 2010-06-24 NOTE — Assessment & Plan Note (Signed)
Previous mild anemia. Reevaluate at this time.

## 2010-06-24 NOTE — Progress Notes (Signed)
Primary Care Physician: Dwana Melena, MD  Primary Gastroenterologist:  Dr. Roetta Sessions  Chief Complaint  Patient presents with  . Follow-up    cirrhosis    HPI: [name] is a 65 y.o. male here for six-week followup of recent EGD with esophageal variceal banding. This is his second session of variceal banding since November 2011. He has had greater than 5 sessions of esophageal variceal banding in the past. He states he is doing well. He seen no evidence of GI bleeding. No melena or bright red blood per rectum. Walks a lot at work. Appetite good. Not taking lactulose. BM 3 daily with meals. No dysphagia, heartburn. No edema. No abdominal pain.   Current Outpatient Prescriptions  Medication Sig Dispense Refill  . atorvastatin (LIPITOR) 10 MG tablet Take 10 mg by mouth daily. 1/2 by mouth once daily       . FREESTYLE LITE test strip 1 strip daily.      . insulin glargine (LANTUS) 100 UNIT/ML injection Inject 10 Units into the skin at bedtime.        . isosorbide mononitrate (IMDUR) 30 MG 24 hr tablet Take 30 mg by mouth daily.        Marland Kitchen levothyroxine (SYNTHROID, LEVOTHROID) 125 MCG tablet Take 125 mcg by mouth daily.        Marland Kitchen lisinopril (PRINIVIL,ZESTRIL) 2.5 MG tablet Take 2.5 mg by mouth daily.        . nadolol (CORGARD) 20 MG tablet Take 20 mg by mouth daily.        . sitaGLIPtan (JANUVIA) 100 MG tablet Take 100 mg by mouth daily.        Marland Kitchen spironolactone (ALDACTONE) 25 MG tablet Take 25 mg by mouth daily. 1/2 by mouth 2 times daily       . DISCONTD: lactulose (CHRONULAC) 10 GM/15ML solution Take 30 mLs by mouth 3 (three) times daily. To produce 2 to 3 bowel movements daily         Allergies as of 06/24/2010  . (No Known Allergies)    ROS:  General: Negative for anorexia, weight loss, fever, chills, fatigue, weakness. ENT: Negative for hoarseness, difficulty swallowing , nasal congestion. CV: Negative for chest pain, angina, palpitations, dyspnea on exertion, peripheral edema.   Respiratory: Negative for dyspnea at rest, dyspnea on exertion, cough, sputum, wheezing.  GI: See history of present illness. GU:  Negative for dysuria, hematuria, urinary incontinence, urinary frequency, nocturnal urination.  Endo: Negative for unusual weight change.    Physical Examination:   BP 126/63  Pulse 71  Temp 98.7 F (37.1 C)  Ht 5\' 7"  (1.702 m)  Wt 185 lb 9.6 oz (84.188 kg)  BMI 29.07 kg/m2  General: Well-nourished, well-developed in no acute distress.  Eyes: No icterus. Mouth: Oropharyngeal mucosa moist and pink , no lesions erythema or exudate. Lungs: Clear to auscultation bilaterally.  Heart: Regular rate and rhythm, no murmurs rubs or gallops.  Abdomen: Bowel sounds are normal, nontender, nondistended, no hepatosplenomegaly or masses, no abdominal bruits or hernia , no rebound or guarding.   Extremities: Trace edema in lower ext bilaterally. Neuro: Alert and oriented x 4   Skin: Warm and dry, no jaundice.   Psych: Alert and cooperative, normal mood and affect.

## 2010-06-24 NOTE — Assessment & Plan Note (Addendum)
Due for a repeat EGD with esophageal variceal banding in June 2012. Patient states he will be on vacation the last 2 weeks in June and would like to have the EGD done when he gets back. We'll triage him prior to procedure. He will continue nadolol.

## 2010-06-24 NOTE — Assessment & Plan Note (Signed)
Elita Boone cirrhosis with history of SMV thrombosis. Due for labs and abdominal ultrasound. Check CBC, INR, CMET, AFP. Advised that he should maintain bowel movement at least 2-3 times daily, if needed add back lactulose which is not taken at this time.

## 2010-06-24 NOTE — Progress Notes (Signed)
SS please NIC

## 2010-06-25 LAB — COMPREHENSIVE METABOLIC PANEL
ALT: 20 U/L (ref 0–53)
BUN: 13 mg/dL (ref 6–23)
CO2: 23 mEq/L (ref 19–32)
Calcium: 9.1 mg/dL (ref 8.4–10.5)
Chloride: 105 mEq/L (ref 96–112)
Creat: 0.83 mg/dL (ref 0.40–1.50)
Glucose, Bld: 233 mg/dL — ABNORMAL HIGH (ref 70–99)
Total Bilirubin: 1.7 mg/dL — ABNORMAL HIGH (ref 0.3–1.2)

## 2010-06-25 LAB — PROTIME-INR: INR: 1.52 — ABNORMAL HIGH (ref <1.50)

## 2010-06-25 LAB — AFP TUMOR MARKER: AFP-Tumor Marker: 2.1 ng/mL (ref 0.0–8.0)

## 2010-06-26 LAB — CBC WITH DIFFERENTIAL/PLATELET
Basophils Absolute: 0 10*3/uL (ref 0.0–0.1)
Eosinophils Relative: 2 % (ref 0–5)
HCT: 31.7 % — ABNORMAL LOW (ref 39.0–52.0)
Hemoglobin: 11.3 g/dL — ABNORMAL LOW (ref 13.0–17.0)
Lymphocytes Relative: 25 % (ref 12–46)
Lymphs Abs: 1 10*3/uL (ref 0.7–4.0)
MCV: 95.8 fL (ref 78.0–100.0)
Monocytes Absolute: 0.4 10*3/uL (ref 0.1–1.0)
Neutro Abs: 2.4 10*3/uL (ref 1.7–7.7)
RBC: 3.31 MIL/uL — ABNORMAL LOW (ref 4.22–5.81)
RDW: 15 % (ref 11.5–15.5)
WBC: 4 10*3/uL (ref 4.0–10.5)

## 2010-06-29 ENCOUNTER — Other Ambulatory Visit: Payer: Self-pay | Admitting: Gastroenterology

## 2010-06-29 ENCOUNTER — Ambulatory Visit (HOSPITAL_COMMUNITY)
Admission: RE | Admit: 2010-06-29 | Discharge: 2010-06-29 | Disposition: A | Discharge status: Home / Self Care | Payer: BC Managed Care – PPO | Source: Ambulatory Visit | Attending: Gastroenterology | Admitting: Gastroenterology

## 2010-06-29 DIAGNOSIS — K8020 Calculus of gallbladder without cholecystitis without obstruction: Secondary | ICD-10-CM | POA: Insufficient documentation

## 2010-06-29 DIAGNOSIS — R161 Splenomegaly, not elsewhere classified: Secondary | ICD-10-CM | POA: Insufficient documentation

## 2010-06-29 DIAGNOSIS — K7460 Unspecified cirrhosis of liver: Secondary | ICD-10-CM | POA: Insufficient documentation

## 2010-06-29 NOTE — Progress Notes (Signed)
Reminder in epic to triage pt prior to EGD in June/July after pt returns from vacation.

## 2010-07-06 NOTE — Progress Notes (Signed)
Reminder in epic to repeat abdominal ultrasound in 6 months

## 2010-07-20 ENCOUNTER — Telehealth: Payer: Self-pay

## 2010-07-20 NOTE — Telephone Encounter (Signed)
OK to schedule. Day of prep: Lantus 5 units qhs. Januvia 50mg  in am. EGD in June.

## 2010-07-20 NOTE — Telephone Encounter (Signed)
Gastroenterology Pre-Procedure Form  Request Date  07/20/2010      PATIENT INFORMATION:  [name] is a 65 y.o., male (DOB=08/19/1945).  PROCEDURE:  Procedure(s) requested: endoscopyProcedure Reason: EGD with esophageal  Variceal banding for esophageal varices  PATIENT REVIEW QUESTIONS: The patient reports the following:   1. Diabetes Melitis: Yes 2. Joint replacements in the past 12 months: no 3. Major health problems in the past 3 months: no 4. Has an artificial valve or MVP:no 5. Has been advised in past to take antibiotics in advance of a procedure like teeth cleaning: no}    MEDICATIONS & ALLERGIES:    Patient reports the following regarding taking any blood thinners:   Plavix? no Aspirin?no Coumadin?  no  Patient confirms/reports the following medications:  Current Outpatient Prescriptions  Medication Sig Dispense Refill  . atorvastatin (LIPITOR) 10 MG tablet Take 10 mg by mouth daily. 1/2 by mouth once daily       . FREESTYLE LITE test strip 1 strip daily.      . insulin glargine (LANTUS) 100 UNIT/ML injection Inject 10 Units into the skin at bedtime.        . isosorbide mononitrate (IMDUR) 30 MG 24 hr tablet Take 30 mg by mouth daily.        Marland Kitchen levothyroxine (SYNTHROID, LEVOTHROID) 125 MCG tablet Take 125 mcg by mouth daily.        Marland Kitchen lisinopril (PRINIVIL,ZESTRIL) 2.5 MG tablet Take 2.5 mg by mouth daily.        . nadolol (CORGARD) 20 MG tablet Take 20 mg by mouth daily.        . sitaGLIPtan (JANUVIA) 100 MG tablet Take 100 mg by mouth daily. Takes tablet in the AM      . spironolactone (ALDACTONE) 25 MG tablet Take 25 mg by mouth daily. 1/2 by mouth 2 times daily         Patient confirms/reports the following allergies:  No Known Allergies  Patient is appropriate to schedule for requested procedure(s)  AUTHORIZATION INFORMATION Primary Insurance ID #: Group #: Pre-Cert / Auth required Pre-Cert / Auth #:  Secondary Insurance,  ID #: Group #:  Pre-Cert /  Auth required:  Pre-Cert / Auth #  No orders of the defined types were placed in this encounter.    SCHEDULE INFORMATION: Procedure has been scheduled as follows:  Date: 08/10/2010, Time: 7:30 AM  Location: [facility] Short Stay  This Gastroenterology Pre-Precedure Form is being routed to the following provider(s) for review: Tana Coast, PA

## 2010-07-20 NOTE — Letter (Signed)
Jul 14, 2006    Erle Crocker, M.D.  [address]  [address]  [place], [place] [address]   RE:  [name], [name]  MRN:  [id number]  /  DOB:  December 25, 1945   Dear Wynona Canes:   [name] returns to the office for continued assessment and treatment  of coronary disease and cardiovascular risk factors. As you know, he has  a history of cirrhosis and is followed by the transplant service at [facility].  He is now 4 years status post CABG surgery and is doing well  symptomatically. He has gradually increased exercise tolerance with an  improved sense of well being. He has no chest pain, nor dyspnea.   He did have an abnormal stress nuclear study approximately a year ago.  There was some discussion of coronary angiography, but this was  eventually deferred.   Although he is followed by the transplant service at [facility], I do not get  the feeling that he is close to requiring such therapy. He has normal  total protein and albumin, essentially normal bilirubin and normal  coags. The principal issue of concern is chronic thrombocytopenia with  platelets last measured at [address].   CURRENT MEDICATIONS:  1. Imdur 30 mg daily.  2. Spironolactone 12.5 mg b.i.d.  3. Metoprolol 25 mg b.i.d.  4. Lisinopril 2.5 mg daily.  5. Januvia 100 mg daily.  6. Levothyroxine 0.1 mg daily.  7. Atorvastatin 5 mg daily.   PHYSICAL EXAMINATION:  GENERAL:  Pleasant well-appearing gentleman.  VITAL SIGNS:  The weight is 192, 3 pounds more than in 10/07. Blood  pressure 115/65, heart rate 70 and regular, respiration 16.  NECK:  No jugular venous distension; minimal right carotid bruit.  LUNGS:  Clear.  CARDIAC:  Normal first and second heart sounds; prominent fourth heart  sound; normal PMI.  ABDOMEN:  Soft and nontender; no hepatomegaly; no surface varicosities.  EXTREMITIES:  1+ ankle and pretibial edema; normal distal pulses.   Recent laboratory obtained in your office was excellent including a  lipid profile.   IMPRESSION:  [name] is doing well on all fronts. Due to the presence  of a carotid bruit, carotid ultrasound will be obtained. Control of  hyperlipidemia and hypertension are excellent. He has no symptoms to  suggest recurrent myocardial ischemia. We will continue his current  medications and plan a return office visit in 8 months.    Sincerely,      Gerrit Friends. Dietrich Pates, MD, [facility]  Electronically Signed    RMR/MedQ  DD: 07/14/2006  DT: 07/15/2006  Job #: [id number]

## 2010-07-20 NOTE — Telephone Encounter (Signed)
LMOM for pt to call and schedule EGD.

## 2010-07-20 NOTE — Assessment & Plan Note (Signed)
NAME:  [name], OROURKE               CHART#:  [id number]   DATE:  07/17/2006                       DOB:  05/01/1945   Followup NASH/cryptogenic cirrhosis, last seen in February at which time  we preformed a fourth EGD for esophageal band ligations.  Varices were  no more than grade 2 at that time.  Additional bands were applied, this  should pretty much take care of his varices.  He has had no melena or  rectal bleeding, feels good, energy level has been adequate.  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  He has been seen by Dr. Julieta [name] down at [facility] and currently he is not on the active transplant list.  He has been seen by Dr. Dietrich [name] as well and he is felt to have coronary  disease but apparently nothing for which intervention is needed at this  time, he is on low dose statin.  He has had some labs at Dr. Virgilio [name]  office, the results of which are unknown to me.  He is due for screening  hepatic ultrasound now.   CURRENT MEDICATIONS:  See updated list.  His medications include:  1. Spironolactone 25 mg orally b.i.d.  2. Lipitor 5 mg daily.   ALLERGIES:  ASA.   EXAMINATION:  Today he looks well.  He is alert, conversant, in no acute  distress.  There is no asterixis.  Weight 196, height 5 foot 7,  temperature 97.9, blood pressure 108/70, pulse 60.  SKIN:  Warm and dry, does have scattered spiders.  There is no jaundice.  CHEST:  Lungs are clear to auscultation.  COR:  Has a regular rate and rhythm without murmur, gallop, or rub.  ABDOMEN:  Flat, no shifting dullness or fluid wave, positive bowel  sounds, soft.  Spleen tip is palpable, no hepatomegaly.   ASSESSMENT:  Cirrhosis, secondary to non-alcoholic steatohepatitis,  overall stable.  I suspect his varices have been pretty well obliterated  at this time.  We need to set him up for a screening hepatic ultrasound  and repeat alpha fetoprotein.  We  will see what labs Dr. Jen [name] has done  recently and we will complement them as needed.  Unless something comes  up, plan to see this nice man back in 6 months.       [name], M.D.  Electronically Signed    RMR/MEDQ  D:  07/17/2006  T:  07/17/2006  Job:  [id number]   cc:   [name], M.D.

## 2010-07-20 NOTE — Assessment & Plan Note (Signed)
NAME:  STEPFON, [name]               CHART#:  [id number]   DATE:  05/03/2007                       DOB:  1946/02/11   FOLLOWUP:  NASH cirrhosis secondary to portal hypertension, extensive  intra-abdominal esophageal varices status post serial EGDs with EBL  obliteration.  History of SMV thrombosis, gastroesophageal reflux  disease, diabetes, hyperlipidemia, hypertension, coronary artery disease  with patent grafts on catheterization at [facility] last year,   [name] returns after last begin seen 10/12/2006 here.  He is doing  well clinically.  He has not had any bleeding, abdominal pain and  swelling, or any episodes of confusion, etc.   Most recent issue was SMV thrombosis.  We did a hypercoagulate workup as  outlined in my 10/12/2006 note.  He has been followed closely by Dr.  Jason Coop down at [facility].  After some contemplation, it was mutually  agreed that he would not be a good anticoagulation candidate.  He has  not had any followup imaging since back in June of 2008.  He sees a  cardiologist down at [facility] and up here with [facility].  He had a  catheterization down at [facility] last year, and he has a patent graft, but  apparently he does have an air myocardium at risk, which is not amenable  to any revascularization procedure, which makes him a marginal liver  transplant candidate.  He has been vaccinated against hepatitis A and B,  and he was switched to a nonselective beta-blocker by the folks down at  [facility] last year.   He is followed closely by Dr. Jen Mow.  He is due to see Dr. Julieta Gutting next  month.   CURRENT MEDICATIONS:  1. Spironolactone 25 mg b.i.d.  2. Lisinopril 2.5 mg daily.  3. Januvia 100 mg daily.  4. Levoxyl 100 mg daily.  5. ASA 81 mg daily  6. Isosorbide 30 mg daily.  7. Metalol 20 mg daily.   ALLERGIES:  Aspirin.   EXAMINATION:  He is alert, conversant, well groomed.  There is no  asterixis.  Weight 190.  That is down 7 pounds.  Height 5 feet 8 inches.  Temperature  97.8, blood pressure 110/70, pulse 60.  SKIN:  Warm and dry.  There is no jaundice.  HEENT:  No scleral icterus.  Conjunctivae are pink.  CHEST:  Lungs are clear to auscultation.  CARDIAC:  Regular rate and rhythm without murmur, gallop, rub.  ABDOMEN:  Nondistended, positive bowel sounds.  Liver edge just  palpable.  I do blot a spleen tip.  There is no peripheral edema.   ASSESSMENT:  NASH cirrhosis with portal hypertension.  Prior history of  variceal bleeding status post obliteration of variceal on serial EGDs.  SMV thrombosis needs to be followup up upon .  He needs a screening  imaging of his liver anyway at this time.   RECOMMENDATIONS:  We will go ahead and get some blood work on him today  including an alpha fetoprotein, set him up for a CT of the abdomen with  IV contrast to follow up on SMV thrombosis, and screen for hepatoma.  We  will go ahead and get these studies out of the way now as he is  anticipated to go ahead and see Dr. Julieta Gutting back in March.  Further  recommendations to follow.  [name], M.D.  Electronically Signed     RMR/MEDQ  D:  05/03/2007  T:  05/03/2007  Job:  [id number]   cc:   Beverly Sessions. Julieta Gutting, Dr.  Jen Mow, Dr.

## 2010-07-20 NOTE — Assessment & Plan Note (Signed)
NAMEMarland [name]  OMARRI, EICH               CHART#:  [id number]   DATE:  10/12/2006                       DOB:  Feb 03, 1946   Followup with NASH/cryptogenic cirrhosis.  Followup with Dr. Julieta Gutting  down at [facility].  He had a cardiac cath down there recently and had,  apparently, some right coronary artery disease felt to be best treated  medically.  He is back on aspirin daily.  Recent CT demonstrated SMA  thrombosis.  He is to see Dr. Julieta Gutting for an MRI, etc., on 11/11/2006.  He has done well, has not had any melena or rectal bleeding.  Varices  pretty well have been obliterated with some serial band ligation  sessions.  Hypercoagulable workup revealed a low Antithrombin III level  of 71, negative Cardiolipin Antibody, a low Protein S level, and a low  Protein C level.  These labs have been sent to Dr. Julieta Gutting.  Dr. Julieta Gutting  strongly recommended not anti coagulating the patient.  He has, in the  meantime, done very well.  Apparently, his current insurance carrier is  not contracting with [facility] for liver transplantation at this time.  On  08/17/2006, alpha-beta protein came back at 2.6.  He is taking  metoprolol and 1/2 of a 25 mg spironolactone twice daily.  He does have  cholelithiasis on ultrasound.  He just saw Dr. Jen Mow who felt that all in  all, the patient was doing well.   CURRENT MEDICATIONS:  See updated list.   ALLERGIES:  ASPIRIN (higher doses cause bleeding).   PHYSICAL EXAMINATION:  He appears alert, converses, no acute distress.  Weight 197, height 5 feet 8 inches, temp 98.1, blood pressure 110/62,  pulse 60.  SKIN:  Warm and dry, there is no jaundice.  Chest and lungs are clear to auscultation.  CARDIAC EXAM:  Regular rate and rhythm without murmurs, gallops, or  rubs.  ABDOMEN:  Nondistended, no obvious ascites, positive bowel sounds, soft,  spleen is palpable.  Liver edge right costal margin.   ASSESSMENT:  NASH/cryptogenic cirrhosis.  Well compensated esophageal  varices have  been pretty well obliterated.  He has a history of SMA clot  on baby aspirin daily per cardiology.   RECOMMENDATIONS:  Warned about the signs and symptoms of bleeding.  Unless something comes up, plan to see this nice gentleman in 6 months.  I strongly urged him to keep his followup appointments down at [facility].  He  is to call if he has any interim problems.       [name], M.D.  Electronically Signed     RMR/MEDQ  D:  10/12/2006  T:  10/12/2006  Job:  [id number]   cc:   Ladona Horns. Mariel Sleet, MD

## 2010-07-23 NOTE — H&P (Signed)
NAME:  [name], [name] NO.:  [id number]   MEDICAL RECORD NO.:  [id number]          PATIENT TYPE:  INP   LOCATION:  IC09                          FACILITY:  [facility]   PHYSICIAN:  Patrica Duel, M.D.    DATE OF BIRTH:  12-10-1945   DATE OF ADMISSION:  05/12/2005  DATE OF DISCHARGE:  LH                                HISTORY & PHYSICAL   CHIEF COMPLAINT:  Vomiting blood.   HISTORY OF PRESENT ILLNESS:  This is a 65 year old male with known  cirrhosis, esophageal varices and associated splenomegaly. He also has a  history of diabetes mellitus and atherosclerotic cardiovascular disease. The  patient underwent multiple-vessel coronary aortobypass grafting in 2004. He  is treated chronically for hyperlipidemia as well as hypothyroidism.   The patient has been doing quite well without significant problems until the  sudden onset of nausea and gross hematemesis, onset approximately 5 p.m. the  evening of admission. He experienced at least five large episodes of emesis.  He came to the emergency room for evaluation where he was witnessed to pass  at least a unit of blood. He also developed bright red blood per rectum. Dr.  Jena Gauss immediately came to the emergency department to evaluate the patient.  His hemodynamics were continuous, but he responded well to resuscitation and  transfusion.   The patient was admitted with severe gastrointestinal bleeding, most likely  from esophageal varices.   In the emergency department, his laboratory results were quite deranged. His  admitted hemoglobin was 6.8 with hematocrit of 19.6, platelet count 77,000.  INR 1.7. Chemistries were otherwise unremarkable except for glucose 214 and  BUN of 36.   The patient denies any chest pain, shortness of breath or syncope. He does  have some vague abdominal pain. There is no neurological deficits or  genitourinary symptoms.   MEDICATIONS:  1.  Synthroid 75 mcg daily.  2.  Altace 2.5 mg b.i.d.  3.  Zetia 10 mg b.i.d.  4.  Torsemide 20 mg daily.  5.  Aspirin 325 daily.  6.  Avandia 8 mg daily.  7.  Metformin 1 g b.i.d.  8.  Niacin 500 mg nightly.  9.  Metoprolol 50 mg 1/2 tablet b.i.d.   ALLERGIES:  None known.   PAST HISTORY:  As noted above.   REVIEW OF SYSTEMS:  Negative except as mentioned.   FAMILY HISTORY:  Noncontributory.   SOCIAL HISTORY:  Nonsmoker, nondrinker.   PHYSICAL EXAMINATION:  GENERAL:  Currently, this is a fully alert male who  is no acute distress. He has had 3 units of packed cells as well as fresh  frozen plasma and vitamin K.  VITAL SIGNS:  Currently, temperature 99.6, BP 100 systolic, heart rate is  approximately 90 and regular.  HEENT:  Normocephalic, atraumatic. Pupils are equal. Ears, nose and throat  are benign.  NECK:  Supple. No JVD.  LUNGS:  Clear.  HEART:  Sounds are normal without murmurs, rubs, or gallops.  ABDOMEN:  Reveals mild epigastric tenderness and mild distention, but bowel  sounds are intact.  EXTREMITIES:  No clubbing, cyanosis,  or edema.  NEUROLOGICAL:  Nonfocal.   ASSESSMENT:  Severe gastrointestinal bleed secondary to esophageal varices  (EGD pending). Known cirrhosis with hypoprothrombinemia and  thrombocytopenia. Cardiovascular status is stable at this time.   PLAN:  Will continue monitoring hemodynamic status. He will be transfused  with more packed cells as well as fresh frozen plasma and platelets this  morning. Will follow and treat expectantly.      Patrica Duel, M.D.  Electronically Signed     MC/MEDQ  D:  05/13/2005  T:  05/13/2005  Job:  [id number]

## 2010-07-23 NOTE — Op Note (Signed)
NAME:  [name], [name] NO.:  [id number]   MEDICAL RECORD NO.:  [id number]          PATIENT TYPE:  AMB   LOCATION:  DAY                           FACILITY:  [facility]   PHYSICIAN:  R. Roetta Sessions, M.D. DATE OF BIRTH:  01/02/1946   DATE OF PROCEDURE:  04/14/2006  DATE OF DISCHARGE:                               OPERATIVE REPORT   PROCEDURE:  EGD and esophageal band ligation.   INDICATIONS FOR PROCEDURE:  The patient is a 65 year old Caucasian male  with cirrhosis and known portal hypertension and esophageal varices.  Last banding session was in October 2007.  He has done well.  He comes  for follow-up exam.  This approach has been discussed with [name].  The potential risks, benefits and alternatives have been  reviewed and questions answered.  He understands and is agreeable.  Please see documentation in the medical record.   PROCEDURE NOTE:  O2 saturation, blood pressure, pulse, and respirations  were monitored throughout the entire procedure.   CONSCIOUS SEDATION:  1. Versed 4 mg IV, Demerol 75 mg IV in divided doses.  2. Cetacaine spray topical pharyngeal anesthesia.   INSTRUMENT:  Pentax PDO system.   FINDINGS:  Examination of the tubular esophagus revealed four columns of  grade 2 esophageal varices without bleeding stigmata.  EG junction  easily traversed.  On entering the stomach, the gastric cavity was  emptied and insufflated well with air.  Thorough examination of the  gastric mucosa and on retroflexion of the proximal stomach and  esophagogastric junction demonstrated only snakeskinning of the gastric  mucosa consistent with portal gastropathy.  Pylorus patent, easily  traversed.  Examination of the bulb and second portion revealed no  abnormalities.   THERAPY/DIAGNOSTIC MANEUVERS PERFORMED:  The scope was withdrawn.  The  Microvasive seven-shot Gabriel Cirri was loaded up.  The esophagus was  reintubated.  There was great resistance upon  rotating the control knob,  and I could not get the bands to deploy. We removed the scope, and a  second scope and a new the banding kit was attached. The esophagus was  reintubated, and a net of four bands were placed, one on each of four  varices.  Good hemostasis was maintained.  The patient tolerated the  procedure well and was reactivated.   ENDOSCOPY IMPRESSION:  1. Four columns of grade 2 esophageal varices, status post band      ligation as described above.  2. Snakeskinning or fish gill-like appeared of the gastric mucosa,      consistent with pole gastropathy.  Otherwise normal stomach, D1,      and D2.   RECOMMENDATIONS:  Full liquid diet for remainder of today.  Advance diet  as tolerated beginning tomorrow.  Will plan to see him back in the  office in 3 months for recheck.      [name], M.D.  Electronically Signed     RMR/MEDQ  D:  04/14/2006  T:  04/14/2006  Job:  [id number]   cc:   Ronda Fairly. Enrigue Catena, M.D.  Hepatologist  [facility]  [facility]  [place]

## 2010-07-23 NOTE — Discharge Summary (Signed)
NAME:  [name], [name] NO.:  [id number]   MEDICAL RECORD NO.:  [id number]          PATIENT TYPE:  INP   LOCATION:  A212                          FACILITY:  [facility]   PHYSICIAN:  Patrica Duel, M.D.    DATE OF BIRTH:  [birth year]   DATE OF ADMISSION:  05/12/2005  DATE OF DISCHARGE:  03/13/2007LH                                 DISCHARGE SUMMARY   DISCHARGE DIAGNOSES:  1.  Severe gastrointestinal bleed secondary to esophageal varices.  2.  Documented cirrhosis followed by gastroenterology.  3.  Coronary artery disease, status post bypass grafting in 2004.  4.  Diabetes mellitus type 2.  5.  Gastroesophageal reflux disease.  6.  Hypothyroidism with recent institution of supplementation.   HISTORY OF PRESENT ILLNESS:  For details regarding admission, please refer  to the admitting note.  Briefly, this very pleasant, 65 year old male with  complicated history as noted above presented to the emergency department  with acute onset of hematemesis.  He was hemodynamically unstable, but  responded well to fluid resuscitation and transfusion as well as other  measures instituted upon admission.  Dr. Jena Gauss immediately saw the patient  and he was transferred to the ICU for ongoing care.   HOSPITAL COURSE:  The patient was transfused with a total of 4 units of  packed red blood cells.  Dr. Jena Gauss performed an upper endoscopy the day  after admission which revealed large esophageal varices.  These were banded.  He remained stable and transferred to the floor for ongoing management.  He  had no recurrent bleeding and his hemoglobin has remained stable at  approximately 9 and 9.5.  He underwent another endoscopy on March 12, where  six more bands were placed on his esophageal varices.   Currently, the patient is stable for discharge.  He is eating well with no  recurrent bleeding.  GI will evaluate the patient prior to him being  released.   DISCHARGE MEDICATIONS:  1.  Synthroid  75 mcg daily.  2.  Altace 2.5 mg b.i.d.  3.  Zetia 10 mg b.i.d.  4.  Torsemide 20 mg daily.  5.  Avandia 8 mg daily.  6.  Metformin one b.i.d.  7.  Niacin 500 mg nightly.  8.  Metoprolol 25 mg b.i.d.  9.  He will be taken off of his aspirin therapy.   SPECIAL INSTRUCTIONS:  Of note, Dr. Jena Gauss will contact the transplant team  at [facility] as he is an excellent candidate for liver transplant.   FOLLOW UP:  He will be followed and treated expectantly as an outpatient.      Patrica Duel, M.D.  Electronically Signed     MC/MEDQ  D:  05/17/2005  T:  05/17/2005  Job:  [id number]

## 2010-07-23 NOTE — Cardiovascular Report (Signed)
NAME:  [name], [name] NO.:  [id number]   MEDICAL RECORD NO.:  [id number]                   PATIENT TYPE:  INP   LOCATION:  2020                                 FACILITY:  [facility]   PHYSICIAN:  Salvadore Farber, M.D.             DATE OF BIRTH:  [birth year]   DATE OF PROCEDURE:  10/04/2002  DATE OF DISCHARGE:                              CARDIAC CATHETERIZATION   PROCEDURE:  Left heart catheterization, left ventriculogram, coronary  angiography.   INDICATION:  [name] is a 65 year old gentleman who presents with chest  pain with minimal exertion.  Due to his classic symptoms and previously  known disease, he was referred directly for coronary angiography.   PROCEDURAL TECHNIQUE:  Informed consent was obtained.  Under 1% lidocaine  local anesthesia, a 6 French sheath was placed in the right femoral artery  using the modified Seldinger technique.  Diagnostic angiography and  ventriculography were performed using JL-3.5, J4-4, and pigtail catheters.  The patient tolerated the procedure well and was transferred to the holding  room in stable condition.  Sheaths are to be removed there.   COMPLICATIONS:  None.   FINDINGS:  1. LV:  106/13/22. EF 50-60%  without regional wall motion abnormality.  2. No mitral regurgitation or aortic stenosis.  3. Left main:  Angiographically normal.  4. LAD:  The LAD is a large vessel which wraps around the apex of the heart     and gives rise to one moderate size diagonal branch.  There is a 90%     ostial stenosis of the LAD.  The entirety of the vessel was heavily     calcified.  There is a 50% stenosis of the mid vessel and a 60% stenosis     at the apex.  5. Ramus intermedius:  Moderate size vessel with an 80% stenosis proximally.  6. Circumflex:  Large vessel giving rise to a single large obtuse marginal.     There is a 70% ostial stenosis.  7. RCA:  The RCA is occluded proximally.  The distal RCA fills via  modest     collaterals from the left.   IMPRESSION/RECOMMENDATIONS:  Severe multivessel disease in the setting of  diabetes mellitus. Touchdown vessels are reasonable targets.  Will ask CVTS  to consider coronary artery bypass grafting.  Will need input from  gastroenterology and hematology regarding risks of anticoagulation for  cardiopulmonary bypass weighed against risks of long term antiplatelet  therapy should a percutaneous approach be advised.  Based on his anatomy, I  think coronary artery bypass grafting is the preferable approach.                                                Salvadore Farber, M.D.    WED/MEDQ  D:  10/04/2002  T:  10/05/2002  Job:  [id number]   cc:   Patrica Duel, M.D.  [address]  [place]  [place] [address]  Fax: [phone]   Vida Roller, M.D.  Fax: [phone]

## 2010-07-23 NOTE — Procedures (Signed)
NAME:  SANI, MADARIAGA NO.:  [id number]   MEDICAL RECORD NO.:  [id number]          PATIENT TYPE:  OUT   LOCATION:  RAD                           FACILITY:  [facility]   PHYSICIAN:  Vida Roller, M.D.   DATE OF BIRTH:  06-30-1945   DATE OF PROCEDURE:  DATE OF DISCHARGE:                                    STRESS TEST   HISTORY:  Mr. Festa is a 65 year old male with coronary artery disease  status post CABG in 2004 with the following grafts; LIMA to LAD, SVG to D,  SVG to OM, with normal EF at that time.  Recent echocardiogram during  hospitalization revealed a depressed EF of 45-50%.   BASELINE DATA:  Electrocardiogram reveals a sinus rhythm at 71 beats per  minute.  Blood pressure is 132/72.   The patient exercised for a total of 7 minutes and 57 seconds Bruce protocol  stage III and 10.1 METS.  Maximal heart rate was 133 which is 83% of  predicted. Maximum blood pressure is 170/68. EKG revealed ST depression in  inferolateral leads with exercise that resolved approximately 18 minutes in  recovery.  He did have a ventricular couplet prior to exercise.  He did  describe some shortness of breath and chest tightness with exercise that  resolved in recovery.   Final images and results are pending MD review.  The patient was given a  prescription for nitroglycerin to take as needed and told to decrease his  activity level until seen by Dr. Dorethea Clan in follow up.      Jae Dire, P.A. [facility]      Vida Roller, M.D.  Electronically Signed    AB/MEDQ  D:  06/21/2005  T:  06/21/2005  Job:  [id number]

## 2010-07-23 NOTE — Op Note (Signed)
NAME:  [name], SENA NO.:  [id number]   MEDICAL RECORD NO.:  [id number]          PATIENT TYPE:  INP   LOCATION:  A212                          FACILITY:  [facility]   PHYSICIAN:  R. Roetta Sessions, M.D. DATE OF BIRTH:  [birth year]   DATE OF PROCEDURE:  05/16/2005  DATE OF DISCHARGE:                                 OPERATIVE REPORT   PROCEDURE:  Esophagogastroduodenoscopy with esophageal band ligation.   INDICATIONS FOR PROCEDURE:  Mr. Hoare is a 64 year old gentleman who  presented with a variceal bleed. He had what amounted to a 5-unit upper GI  bleed. He underwent esophageal variceal band ligation on May 12, 2005. He  was found to have grade 4 esophageal varices. He underwent band ligation,  and he has been maintained on Sandostatin now subcutaneously. He has done  well. Because the fundus was full of blood, I was unable to complete  examination on March 8. He comes for followup exam now. This approach has  been discussed with he and his wife. Potential risks, benefits, and  alternatives have been reviewed and questions answered. He is agreeable.  Please see documentation in the medical record.   PROCEDURE NOTE:  O2 saturation, blood pressure, pulse, and respirations were  monitored throughout the entire procedure. Conscious sedation with Versed 3  mg IV and Demerol 50 mg IV in divided doses.   INSTRUMENT:  Olympus video chip system.   FINDINGS:  Examination of the tubular esophagus revealed again four columns  of varices. Previously placed bands still in place. Overall variceal size  diminished, probably grade 2 to grade 3. There was no bleeding. Please see  photos. EG junction easily traversed.   Stomach:  Gastric cavity was empty today. Thorough examination of gastric  mucosa including retroflexed view of the proximal stomach and  esophagogastric junction demonstrated snake-skinning, fish-scale appearance  consistent with diffuse portal gastropathy. There  was no infiltrating  process. Pylorus patent and easily traversed. Examination of bulb and second  portion revealed no abnormalities.   THERAPEUTIC/DIAGNOSTIC MANEUVERS:  The scope was removed from the patient,  and a seven-shot Microvasive banding unit was placed. I reintubated the  esophagus and applied six more bands on four columns of remaining varices.  This was done without complication or apparent bleeding.   The patient tolerated the procedure well and was reactive to endoscopy.   IMPRESSION:  1.  Esophageal varices status post repeat band ligation today.  2.  Portal gastropathy. No peptic ulcer or infiltrating process. Normal D1      and D2.   RECOMMENDATIONS:  1.  Soft diet.  2.  He is already on metoprolol. Will hold off on adding propranolol for the      time being.  3.  Will follow him up in the office in a short interval and get the ball      rolling for an outpatient liver transplant evaluation. We will taper off      Sandostatin today. Hopefully, Mr. Yamashiro will be able to go home in the      next 24 hours.  [name], M.D.  Electronically Signed     RMR/MEDQ  D:  05/16/2005  T:  05/17/2005  Job:  [id number]

## 2010-07-23 NOTE — Op Note (Signed)
NAME:  [name], [name]                        ACCOUNT NO.:  [id number]   MEDICAL RECORD NO.:  [id number]                   PATIENT TYPE:  INP   LOCATION:  2313                                 FACILITY:  [facility]   PHYSICIAN:  Salvatore Decent. Dorris Fetch, M.D.         DATE OF BIRTH:  1945/08/11   DATE OF PROCEDURE:  10/15/2002  DATE OF DISCHARGE:                                 OPERATIVE REPORT   PREOPERATIVE DIAGNOSIS:  Severe three-vessel disease with class II angina.   POSTOPERATIVE DIAGNOSIS:  Severe three-vessel disease with class II angina.   PROCEDURES:  1. Median sternotomy.  2. Extracorporeal circulation.  3. Coronary artery bypass grafting x3 (left internal mammary artery to left     anterior descending artery, saphenous vein graft to first diagonal,     saphenous vein graft to obtuse marginal 1).  4. Endoscopic vein harvest, right thigh.  5. Open vein harvest, right ankle.   SURGEON:  Salvatore Decent. Dorris Fetch, M.D.   ASSISTANT:  Toribio Harbour, N.P.   ANESTHESIA:  General.   FINDINGS:  Diffusely diseased coronaries.  Good-quality conduits.  Ramus  intermedius, posterior descending, and posterolateral all 1 mm vessels,  heavily calcified and not graftable.   Anastomosis to OM-1 done off pump.  Near completion of the anastomosis, the  patient became hemodynamically unstable with bradycardia and hypotension.  This resolved dramatically when the heart was repositioned in its normal  location; however, because of persistent right ventricular distention and  elevated pulmonary artery pressures, a decision was made to proceed with  cardiopulmonary bypass for the final two anastomoses.   CLINICAL NOTE:  [name] is a 65 year old gentleman with diabetes and  thrombocytopenia secondary to cirrhosis, who presents with class II anginal  symptoms.  At catheterization he has severe three-vessel disease including  significant proximal LAD and circumflex disease.  The patient was  advised to  undergo coronary artery bypass grafting for survival benefit and relief of  symptoms.  The indications, risks, benefits, and alternatives were discussed  in detail with the patient.  He understood his increased risk for bleeding  secondary to thrombocytopenia as well as his increased risk for death or  other complications secondary to his cirrhosis.  The patient understood and  accepted the risks of surgery and agreed to proceed.   OPERATIVE NOTE:  [name] was brought to the preop holding area on October 15, 2002.  Lines were placed to monitor arterial, central venous, and  pulmonary arterial pressure by the anesthesia service.  Intravenous  antibiotics were administered.  Mr. Vanatta was taken to the operating room,  anesthetized, and intubated.  A Foley catheter as placed, and the chest,  abdomen, and legs were prepped and draped in the usual fashion.   A median sternotomy was performed.  The left internal mammary artery was  harvested in standard fashion.  It was a good-quality vessel.  The patient  did bleed very  easily.  He was fully heparinized prior to dividing the  distal end of the mammary artery and given the dose adjusted for off-pump  grafting.  There was excellent flow through the cut end of the mammary  artery.   Simultaneously an incision was made in the medial aspect of the right leg  and the greater saphenous vein was harvested from the right thigh  endoscopically.  Just below the knee it became very small, and an additional  segment was required from the right ankle for the diagonal graft.   The pericardium was opened.  The pericardial cradle was created.  Deep  pericardial stay sutures were placed running from the inferior vena cava to  the left superior pulmonary vein.  These were covered with Rumel tourniquets  to prevent epicardial laceration.  Traction was placed on the deep  pericardial sutures, prolapsing the apex of the heart anteriorly and out  of  the chest.  The patient tolerated this well hemodynamically and was placed  in Trendelenburg position and rotated slightly to the right side.  T he  coronary arteries were inspected and the anastomotic sites were chosen.  The  patient had marked left ventricular hypertrophy and cardiomegaly.  The  posterior descending and posterolateral branch of the right coronary artery  were all 1 mm or less vessels.  All were diffusely calcified, as was the  ramus intermedius.  None of these vessels were graftable by on- or off-pump  means.  All coronaries were diffusely diseased.  Sites were selected for  grafting of the LAD, the first diagonal, as well as the large obtuse  marginal 1.  The conduits were inspected and to length.   Using the [employer] system, a suction device was placed on the apex of the  heart by retracting to the surgeon's side.  The obtuse marginal 1 was  identified.  Silastic tapes were placed proximal and distal to the site  selected for anastomosis, and the plastic stabilizing device was placed over  the vessel.  The patient's blood pressure at this point was 80 systolic with  no EKG changes, and he appeared to be tolerating this well hemodynamically.  An arteriotomy was performed.  It was probed proximally and distally with a  1.5 mm probe.  This was a diffusely-diseased vessel, but the probe passed  easily.  The vein graft was spatulated.  It was of good quality.  The  anastomosis was performed end-to-side with a running 7-0 Prolene suture.  Near the completion of the anastomosis, the patient developed bradycardia  and hypotension and became hemodynamically unstable.  The remaining sutures  were placed.  The anastomosis was probed proximally and distally and the  suture was tied.  The stabilizer was removed and the heart was placed back  into the normal location in the pericardium as rapidly as possible.  This was within two to three minutes of the patient becoming unstable.   The  patient's systolic blood pressure improved dramatically and immediately, but  he had elevated pulmonary arterial pressures and right ventricular  distention.  The remainder of the full heparin dose was administered.  The  aorta was cannulated via concentric 2-0 Ethibond pledgeted pursestring  sutures.  A dual-stage venous cannula was placed via a pursestring suture in  the right atrial appendage.  As soon as adequate anticoagulation was  confirmed with ACT measurement, the patient was placed on cardiopulmonary  bypass, the heart was emptied.  A foam pad was placed in the  pericardium to  protect the phrenic nerve and insulate the heart.  A temperature probe was  placed in the myocardial septum and a cardioplegia cannula was placed in the  ascending aorta.   The aorta was crossclamped.  The left ventricle was emptied via the aortic  root vent.  Cardiac arrest then was achieved with a combination of cold  antegrade blood cardioplegia and topical ice saline.  After achieving a  complete diastolic arrest and adequate myocardial septal cooling with a  temperature of 12 degrees Celsius, the following distal anastomoses were  performed:   First a reversed saphenous vein graft was placed end-to-side to the first  diagonal branch of the LAD.  The first diagonal was a 1.5 mm vessel that was  diffusely diseased.  The vein graft was of good quality.  The anastomosis  was performed with a running 7-0 Prolene suture.  There was excellent flow  through the anastomosis.  Cardioplegia was administered, and there was good  hemostasis.   Next the left internal mammary artery was prepared for grafting.  The distal  end was spatulated.  It was a 2 mm good-quality conduit.  It was anastomosed  end-to-side to the distal LAD, which was a 2 mm vessel but was diffusely  diseased.  The anastomosis was performed with a running 8-0 Prolene suture.  At the completion of the mammary to LAD anastomosis, the  bulldog clamp was  removed.  Septal rewarming was noted.  The bulldog clamp was replaced.  The  mammary pedicle was secured to the epicardial surface of the heart with 6-0  Prolene suture.   Additional cardioplegia then was administered and the vein grafts were cut  to length.  The cardioplegia cannula was removed from the ascending aorta  and the proximal vein graft anastomoses were performed to 4.4 mm punch  aortotomies with running 6-0 Prolene sutures.  After completion of the final  proximal anastomosis, the bulldog clamp was once again removed from the  mammary artery.  Septal rewarming was again noted.  Lidocaine was  administered.  The aortic root was de-aired.  After completely de-airing the  aortic root the patient was placed in Trendelenburg position and the aortic  crossclamp was removed.  The vein grafts were aspirated and the bulldog  clamps were removed from them.  All proximal and distal anastomoses were inspected for hemostasis.  There was a leak from the heel of the anastomosis  of the saphenous vein to obtuse marginal 1, which was repaired with a single  7-0 Prolene suture.  Epicardial pacing wires then were placed on the right  ventricle and right atrium.  The patient was at this point in junctional  rhythm and then was DDD paced for rate.  A dopamine drip was initiated.  When the patient had been warmed to a core temperature of 37 degrees  Celsius, he was weaned from cardiopulmonary bypass without difficulty.  He  was on a low-dose dopamine drip.  The initial cardiac index was 1.7 L/min  per sq. m.  The patient soon thereafter went into sinus rhythm and then had  cardiac indices greater than 2.5 L/min. per sq. m.  He then remained  hemodynamically stable thereafter.   A test dose of protamine was administered and was well-tolerated.  The  atrial and aortic cannulae were removed.  The remainder of the protamine was  administered without incident.  The chest was  irrigated with warm normal  saline containing 1 g of vancomycin.  Hemostasis was achieved.  A left  pleural and two mediastinal chest tubes were placed through separate  subcostal incisions and secured with #1 silk sutures.  The sternum was  closed with heavy-gauge interrupted stainless steel wires.  The remainder of  the incisions were closed in standard fashion.  All sponge, needle, and  instrument counts were correct at the end of the procedure.  Mr. Minella was  taken from the operating room to the surgical intensive care unit intubated  and in critical but stable condition.                                               Salvatore Decent Dorris Fetch, M.D.    SCH/MEDQ  D:  10/15/2002  T:  10/15/2002  Job:  [id number]   cc:   Vida Roller, M.D.  Fax: [phone]   Lina Sar, M.D. [facility]   Patrica Duel, M.D.  [address]  [place]  [place] [address]  Fax: [phone]

## 2010-07-23 NOTE — Letter (Signed)
December 14, 2005    Erle Crocker, MD  [phone], [address]  [place], [place]  [address]   RE:  [name], [name]  MRN:  [id number]  /  DOB:  03/14/1945   Dear Wynona Canes,   [name] was seen in the office today for continued assessment and  treatment of coronary disease.  As you know, he was previously followed by  Dr. Dorethea Clan, but has now been transferred to my practice.  He is now three  years status post coronary artery bypass grafting surgery and is being seen  at [facility] for possible liver transplantation.  Coronary angiography was  recommended as part of his assessment.   [name] is generally asymptomatic, but does experience angina with a high  level of exertion.  He recently underwent a stress nuclear study that was  positive both electrocardiographically and in terms of inferior wall  perfusion.  Nonetheless, this was a low risk study that can be managed  medically, especially in light of [name] other problems.   CURRENT MEDICATIONS:  1. Imdur 30 mg daily  2. Avandia 2 mg q.d.  3. Spironolactone 12.5 mg twice daily  4. Metoprolol 25 mg twice daily.  5. Levothyroxine 0.5 mg q.d.  6. Lisinopril 2.5 mg daily.   PHYSICAL EXAMINATION:  GENERAL:  A very pleasant, healthy-appearing  gentleman in no acute distress.  VITAL SIGNS:  The weight is 189 stable, blood pressure 100/60, heart rate 65  and regular, respirations 16.  NECK:  No jugular venous distention, normal carotid upstrokes without  bruits.  SKIN:  No spider angiomata.  LUNGS:  Clear.  CARDIAC:  Normal first and second heart sounds, fourth heart sound present.  ABDOMEN:  Soft and nontender, liver edge a few centimeters below the right  costal margin, but liver span appears normal.  EXTREMITIES:  1/2+ ankle edema, distal pulses intact.   The only recent laboratories available to me are from last month at which  time hemoglobin was 10.5, sodium 139, potassium 4.1 and glucose 122.   IMPRESSION:  [name] is doing well from a cardiac standpoint.  It appears  to me that his likelihood for liver transplantation, especially at the  present time, is remote.  While coronary angiography is not excessively  risky, it will entail anticoagulation for a period of time.  There is almost  no likelihood that percutaneous intervention would be appropriate, and  repeat bypass surgery is out of the question.  Accordingly, I do not see  much value in cardiac catheterization at this time.  Even if we document  patent grafts with no progression in native disease, there will need to be  consideration of a repeat procedure should transplantation become more of a  possibility in the future.  I have placed a call to Dr. Julieta Gutting and will  discuss this with him as soon as he can be contacted.   With respect to [name] medical regime, Avandia is not the most  desirable drug in light of recent data.  You may wish to change him to a  different diabetic drug.  All of his lipid lowering therapy has been  stopped.  He tells me that you plan to repeat a lipid profile. Whatever the  result, I would like to see him receiving a statin drug.  Even though he has  liver disease, I don't think the likelihood of significantly exacerbating  this with statins is of particular concern.  Otherwise, edema  appears  controlled on a surprisingly low dose of spironolactone.  I also recommended  that [name] receive pneumococcal vaccine and influenza vaccine when he  sees you in November.  I will plan to reassess this nice gentleman in six  months.    Sincerely,     Gerrit Friends. Dietrich Pates, MD, [facility]    RMR/MedQ  /  Job #:  [id number]  DD:  12/14/2005 / DT:  12/16/2005   CC:   R. Roetta Sessions, M.D.  Jason Coop, M.D.

## 2010-07-23 NOTE — Consult Note (Signed)
NAME:  TESHAWN, [name] NO.:  [id number]   MEDICAL RECORD NO.:  [id number]          PATIENT TYPE:  INP   LOCATION:  IC09                          FACILITY:  [facility]   PHYSICIAN:  R. Roetta Sessions, M.D. DATE OF BIRTH:  [birth year]   DATE OF CONSULTATION:  05/12/2005  DATE OF DISCHARGE:                                   CONSULTATION   REASON FOR CONSULTATION:  Upper gastrointestinal bleeding.   HISTORY OF PRESENT ILLNESS:  Mr. [name]. [name] is a pleasant 65 year old  Caucasian male with a history of known cirrhosis and esophageal varices.  Was in his usual state of fair health until this afternoon when he started  having nausea and developed gross hematemesis and melena.  He presented to  the ED where he was evaluated by Dr. Neale Burly.  He was found to be  hypotensive with a systolic pressure of 90.  Gross hematemesis of about half  a unit of blood was witnessed by the ED staff.  Initial labs have included  an H&H of 6.8 and 19.6, MCV 95.6, platelet count 70,000.  INR 1.7.  His  albumin is 2.5.  GOT and GPT are 37 and 19.  Total bilirubin 1.  BUN 36,  creatinine 0.9, glucose 214.   Mr. [name] denies ever having any GI bleeding previously.   He has been evaluated previously for thrombocytopenia and splenomegaly.  He  underwent a bone marrow aspirate which demonstrated negative iron stores.  I  saw him on April 26, 2002 and performed an EGD and colonoscopy.  He was  found to have  __________ grade 2 to 3 esophageal varices and scattered left-  sided diverticula and some vascular congestion throughout the left colon.  Workup was initiated for his liver.  He tells me he came back to see me one  time, one year later, but has not been seen by me in two to three years.  He  tells me he does not know the etiology of his cirrhosis and the bulk of the  records pertaining to that evaluation are unavailable to me at this time.   PAST MEDICAL HISTORY:  1.  Significant for  coronary artery disease status post CABG in 2004.  2.  History of thrombocytopenia secondary to cirrhosis.  3.  History of cirrhosis.  4.  History of type 2 diabetes mellitus.  5.  Gastroesophageal reflux disease.  6.  History of hypothyroidism, recently started on supplementation.   MEDICATIONS ON ADMISSION:  1.  Niacin.  2.  Metformin.  3.  Avandia.  4.  Furosemide.  5.  Zetia.  6.  Altace.  7.  Levothyroxine.  8.  Aspirin 325 mg.   He denies any other nonsteroidal agents.   ALLERGIES:  No known drug allergies.   FAMILY HISTORY:  Negative for chronic GI or liver illness.   SOCIAL HISTORY:  Patient is a retired Health and safety inspector.  He does not smoke.  He rarely consumes alcohol, never been a significant consumer of alcohol.  He has three children and 4 step-children.  He is married and he lives in  [place].   REVIEW OF SYSTEMS:  CHEST:  Has not had recent chest pain or dyspnea.  CONSTITUTIONAL:  No fever or chills.  GASTROINTESTINAL:  He has not had any  hematochezia, although he does describe melena very well from earlier today.   PHYSICAL EXAMINATION:  GENERAL:  Reveals a pale gentleman seen in ED room 8.  He is accompanied by his wife.  He is alert and conversant.  No acute  distress.  VITAL SIGNS:  BP 92/54, pulse 85, respiratory rate 18.  SKIN:  Pale.  There is no jaundice.  HEENT:  No scleral icterus.  Conjunctivae are pale.  Oral cavity:  No  lesions.  CHEST:  Lungs are clear to auscultation.  CARDIAC:  Regular rate and rhythm without murmur, gallop or rub.  ABDOMEN:  Full.  Positive bowel sounds.  Soft.  He has an easily ballottable  spleen.  Liver edge is two finger breadths below the right costal margin.  No obvious ascites.  Abdomen is nontender.  EXTREMITIES:  Trace lower extremity edema.   LABORATORY DATA:  As described above.   IMPRESSION:  Mr. [name]. [name] is a very pleasant 65 year old gentleman  with known cirrhosis and varices who has  presented with a hemodynamically-  significant upper gastrointestinal bleed.  Most likely, he is bleeding from  varices but that needs to be proven.  His hemodynamic status is tenuous at  this time.   RECOMMENDATIONS:  1.  Agree with ICU admission.  2.  I will establish two large-bore IVs.  3.  Four units have been typed and crossed.  I have gone ahead and ordered      three units be transfused, over two hours each.  4.  Begin Sandostatin 50-mcg IV bolus now, followed by 25 mcg IV per hour      thereafter.  5.  Protonix 40 mg IV now, followed by 40 mg IV q.24h.  6.  I will give him two units of FFP, as well, also give him a single dose      of vitamin K 10 mg subcu.  7.  Would anticipate bedside ICU EGD early tomorrow morning, once he has      been additionally resuscitated.   I have discussed my plan with Mr. [name] and his wife.  Potential risks,  benefits and alternatives have been reviewed.  The potential for esophageal  band ligation or therapeutic intervention have been reviewed with the  patient and patient's wife.   I would like to thank Dr. Patrica Duel for letting me see this nice  gentleman this evening.      [name], M.D.  Electronically Signed     RMR/MEDQ  D:  05/12/2005  T:  05/13/2005  Job:  [id number]

## 2010-07-23 NOTE — Group Therapy Note (Signed)
NAME:  Nathan, [name] NO.:  [id number]   MEDICAL RECORD NO.:  [id number]          PATIENT TYPE:  INP   LOCATION:  A212                          FACILITY:  [facility]   PHYSICIAN:  R. Roetta Sessions, M.D. DATE OF BIRTH:  11/04/1945   DATE OF PROCEDURE:  05/15/2005  DATE OF DISCHARGE:                                   PROGRESS NOTE   SUBJECTIVE:  Follow up variceal bleed.  [name] is out on the second  floor.  He is doing very well with hemoglobin 9.3.  He is not having any  bowel movements.  He is tolerating a full liquid diet.  Discussed with he  and his wife at length the need for outpatient liver transplant evaluation.   ASSESSMENT:  Because I did not seen his proximal stomach, he is slated to  have an EGD tomorrow.  May actually apply additional bands to the remaining  varices.  Discussed, risks, benefits and alternatives to that approach.  All  parties are agreeable.   PLAN:  Plan to perform followup EGD tomorrow morning.      [name], M.D.  Electronically Signed     RMR/MEDQ  D:  05/15/2005  T:  05/16/2005  Job:  [id number]

## 2010-07-23 NOTE — Op Note (Signed)
NAME:  [name], [name] NO.:  [id number]   MEDICAL RECORD NO.:  [id number]          PATIENT TYPE:  AMB   LOCATION:  DAY                           FACILITY:  [facility]   PHYSICIAN:  R. Roetta Sessions, M.D. DATE OF BIRTH:  [birth year]   DATE OF PROCEDURE:  11/24/2005  DATE OF DISCHARGE:                                 OPERATIVE REPORT   PROCEDURE:  EGD with esophageal band ligation.   INDICATIONS FOR PROCEDURE:  The patient is a 65 year old gentleman with NASH  cirrhosis complicated by a variceal hemorrhage. Back in March of this year,  he underwent two back to back banding sessions at that time. We referred him  to [facility], [facility] put off their hepatology transplant evaluation appointment until  just recently. He was seen down there on the 6th of this month by Dr. Tora Perches. He was doing well until the first of this week when he passed  several black tarry stools, no hematemesis, no hematochezia. Finger prick  hemoglobin yesterday was done, results unknown to me. Dr. Nicole Cella called Dr.  Karilyn Cota who called me to expedite repeat EGD. [name] presents today  hemodynamic, awake, quite stable, has not had any block stools in the past  24 hours. An i-STAT hemoglobin is 10.5 however. EGD is now being done with  plans for esophageal band ligation as appropriate. This approach has been  discussed with the patient at length. The potential risks, benefits, and  alternatives have been reviewed, questions answered, he is agreeable. Please  see documentation in the medical record.   MONITORING:  O2 saturation, blood pressure, pulse, and respirations were  monitored throughout the entire procedure.   CONSCIOUS SEDATION:  Versed 3 mg IV, Demerol 75 mg IV in divided doses.  Cetacaine spray for topical oropharyngeal anesthesia.   FINDINGS:  Examination of the tubular esophagus revealed 4 columns of grade  3-4 esophageal varices. One of the more proximal varices had an ulcer with a  white  exudate overlying it, otherwise the mucosa appeared normal. The EG  junction was easily traversed.   STOMACH:  The gastric cavity was empty and insufflated well with air. A  thorough examination of the gastric mucosa and retroflexion of the proximal  stomach and esophagogastric junction demonstrated a small hiatal hernia and  snake-skinning of the gastric mucosa consistent with portal gastropathy.  There was no ulcer or other abnormality. The pylorus was patent and easily  traversed. Examination of the bulb and second portion revealed no  abnormalities.   THERAPEUTIC/DIAGNOSTIC MANEUVERS:  The scope was withdrawn, a 7 shot  Microvasive banding unit was attached. The esophagus was reintubated and a  total of 6 bands were placed. The area of ulceration in the proximal  __________  was banded firstly and subsequently 5 more bands were placed on  the remaining varices. Good hemostasis was maintained as this maneuver was  done. The patient tolerated the procedure very well and was reacted in  endoscopy.   IMPRESSION:  Four columns grade 3-4 esophageal varices status post  esophageal band ligation. As described above small hiatal hernia,  portal  gastropathy otherwise normal stomach. Normal D1 and D2.   RECOMMENDATIONS:  Will arrange for [name] to return in 4 weeks to repeat  EGD and perform additional band ligation as appropriate.      [name], M.D.  Electronically Signed     RMR/MEDQ  D:  11/24/2005  T:  11/26/2005  Job:  [id number]   cc:   Dr. Claudean [facility] GI Division

## 2010-07-23 NOTE — Op Note (Signed)
NAME:  [name], [name] NO.:  [id number]   MEDICAL RECORD NO.:  [id number]          PATIENT TYPE:  INP   LOCATION:  IC09                          FACILITY:  [facility]   PHYSICIAN:  R. Roetta Sessions, M.D. DATE OF BIRTH:  [birth year]   DATE OF PROCEDURE:  05/13/2005  DATE OF DISCHARGE:                                 OPERATIVE REPORT   PROCEDURE:  Beside esophagogastroduodenoscopy with therapeutic intervention.   INDICATIONS FOR PROCEDURE:  The patient is a 65 year old Caucasian male with  known cirrhosis and esophageal varices admitted last evening with a  hemodynamically significant upper GI bleed. His hemoglobin is up to 7.7 from  6.8 after 3 units of packed red blood cells. He has been started on IV  Sandostatin and IV Protonix. He is hemodynamically stable at this point in  time. EGD is now being done at the bedside. This approach has been discussed  with the patient at length. Potential risks, benefits, and alternatives have  been reviewed. All parties agreeable.   PROCEDURE NOTE:  The patient placed in the left lateral decubitus position.  O2 saturation, blood pressure, pulse, and respirations were monitored  throughout the entire procedure. Conscious sedation with Versed 2 mg IV and  Demerol 25 mg IV in divided doses. Cetacaine spray for topical oropharyngeal  anesthesia.   INSTRUMENT:  Olympus video chip system.   FINDINGS:  Examination of the tubular esophagus revealed bulging grade 4  esophageal varices, 4 columns. There is not active bleeding. However, there  was stigmata of bleeding with cherry-red spots and  well marks in a number  of locations. Please see photos. EG junction was easily traversed. There was  quite a bit of blood and clot and possibly some food debris in the fundus  precluding complete examination of the stomach. There did not appear to be  any active bleeding, however, the antrum was seen. This segment of the GI  tract appeared normal.  Pylorus patent and easily traversed. Examination of  bulb and second portion revealed no abnormalities.   THERAPEUTIC/DIAGNOSTIC MANEUVERS:  The scope was withdrawn, and the  Microvasive seven-shot bander was loaded up. We intubated the esophagus once  again, and I placed seven bands on six sites. Good hemostasis was  maintained. The patient tolerated the procedure well.   IMPRESSION:  1.  Bulging grade 4 esophageal varices, 4 columns, with bleeding stigmata,      treated with esophageal band ligated as described above.  2.  Blood clots and food debris in the proximal stomach precluded complete      examination. Antrum appeared normal as did the D1 and D2.   RECOMMENDATIONS:  1.  His platelets have dropped to 50,000. We will go ahead and give him a      packet of platelets. He needs 2 more units of packed red blood cells. We      will try to get his hemoglobin and hematocrit up in the 10 and 30 range.      Will continue IV protein pump inhibitor therapy and Sandostatin for the      time  being.  2.  Will allow clear liquid diet later today.  3.  He will need an early followup EGD. As a separate issues, we need to      delve into the etiology of the cirrhosis further and will contemplate      referral to a transplant center electively.      [name], M.D.  Electronically Signed     RMR/MEDQ  D:  05/13/2005  T:  05/14/2005  Job:  [id number]

## 2010-07-23 NOTE — Discharge Summary (Signed)
NAME:  [name], [name] NO.:  [id number]   MEDICAL RECORD NO.:  [id number]                   PATIENT TYPE:  INP   LOCATION:  2020                                 FACILITY:  [facility]   PHYSICIAN:  Vida Roller, M.D.                DATE OF BIRTH:  December 28, 1945   DATE OF ADMISSION:  10/03/2002  DATE OF DISCHARGE:  10/05/2002                           DISCHARGE SUMMARY - REFERRING   PROCEDURES:  1. Cardiac catheterization.  2. Coronary arteriogram.  3. Left ventriculogram.   HOSPITAL COURSE:  [name] is a 65 year old male with no known history of  coronary artery disease.  He was evaluated by Dr. Vida Roller in the  [place] office for chest pain.  His past medical history included  cirrhosis with resultant thrombocytopenia and iron-deficiency anemia, as  well as evidence of esophageal varices and splenomegaly.  His bone marrow  biopsy showed decreased iron stores.  He also has history of diabetes.  Dr.  Dorethea Clan evaluated [name] and felt that cardiac catheterization was  indicated with input from GI, as far as the use of aspirin.   [name] was admitted on October 03, 2002 and seen by GI.  Dr. Lina Sar  saw [name] and felt that Protonix should be added if aspirin was to be  used.  She also felt that the liver function evaluation shows good function  and that he should do alright with cardiac catheterization.  The cirrhosis  was compensated and the thrombocytopenia is chronic and unchanged since  evaluation began.  His platelets generally run greater than 50,000.  He was  considered stable for catheterization on October 04, 2002.   The cardiac catheterization showed a normal left main, a proximal LAD 90%,  ramus intermedius 80%, circumflex 70% and the RCA totaled with left-to-right  collaterals.  His EF was 56% with no regional wall motion abnormalities and  no MR or AS.  The films were evaluated by Dr. Samule Ohm and it was felt that  [facility] was  the best option.  They were to evaluate the patient on Monday.   On October 05, 2002, [name] was ambulating without chest pain or shortness  of breath.  He stated that he was having no resting symptoms and that his  symptoms were strictly exertional.  He requested discharge with followup as  an outpatient but was advised that the safest option would be continued  hospitalization.  The risks of MI and death were explained to him but he  insisted on leaving.  Dr. Dorris Fetch of [facility] was contacted and it was  advised that the patient could call on Monday for an appointment.  He did  have aspirin, as well as Protonix, added to his medication regimen.  He is  to sign out AMA today and will have outpatient followup arranged.  He also  needs to have lipids checked with medical therapy depending on  the results.   LABORATORY VALUES:  Hemoglobin 12.5, hematocrit 35.5, WBC 3.3, platelets 63.  Sodium 140, potassium 3.9, chloride 106, CO2 28, BUN 11, creatinine 0.8,  glucose 135.   Chest x-ray:  Very mild peribronchial thickening.   Additional laboratory values:  INR 1.3, ammonia 46, AST 1.4, magnesium 2,  serum IgG 1200, serum IgM 133.  Serial CK-MB and troponin-I negative for MI.   DISCHARGE CONDITION:  AMA.   DISCHARGE INSTRUCTIONS:  1. He is to do no strenuous or sexual activity until cleared by M.D.  2. He is to stick to a low-fat diabetic diet.  3. He is to follow up with our office and with [facility], as well as his family     physician.   DISCHARGE MEDICATIONS:  1. Aspirin 81 mg daily.  2. Nitroglycerin p.r.n.  3. Avandia 8 mg daily.  4. Lopressor 50 mg b.i.d.  5. Glucophage 500 mg b.i.d.  Restart on Monday.  6. Protonix 40 mg daily.      Lavella Hammock, P.A. [facility]                  Vida Roller, M.D.    RG/MEDQ  D:  10/05/2002  T:  10/05/2002  Job:  [id number]   cc:   Patrica Duel, M.D.  [address]  [place]  [place] [address]  Fax: [phone]   Salvatore Decent.  Dorris Fetch, M.D.  [address]  [place]  [place] [address]  Fax: [phone]

## 2010-07-23 NOTE — Op Note (Signed)
NAME:  [name], [name] NO.:  [id number]   MEDICAL RECORD NO.:  [id number]          PATIENT TYPE:  AMB   LOCATION:  DAY                           FACILITY:  [facility]   PHYSICIAN:  R. Roetta Sessions, M.D. DATE OF BIRTH:  1945/04/21   DATE OF PROCEDURE:  12/22/2005  DATE OF DISCHARGE:                                 OPERATIVE REPORT   PROCEDURE:  EGD, with esophageal band ligation of esophageal varices.   INDICATIONS FOR PROCEDURE:  The patient is a 65 year old gentleman with NASH  cirrhosis complicated by variceal hemorrhage.  Last EGD was November 24, 2005, at which time six bands were on multiple columns of varices.  He had  four columns of grade 3-4 varices.  He has done well since that time.  He is  followed by Dr. Alvia Grove down at the transplant clinic at [facility].  He  is here for a follow-up band session.  This approach has been discussed with  the patient at length.  The potential risks, benefits, and alternatives have  been reviewed.  Questions answered.  He is agreeable.  Please see  documentation in the medical record.   PROCEDURE NOTE:  O2 saturation, blood pressure, pulse, and respirations  monitored throughout the entire procedure.   CONSCIOUS SEDATION:  Versed 3 mg IV, Demerol 75 mg IV in divided dose.   INSTRUMENT:  Olympus video chip system.   Examination of the tubular esophagus revealed a couple of areas of healing  esophageal mucosal ulceration, likely site of prior band placement.  He had  four persisting columns of varices.  One was grade 3, the other were right  about grade 2.  No bleeding stigmata.  The overlying mucosa otherwise  appeared normal.  EG junction was easily traversed, entering the stomach.  Gastric cavity was empty.  It insufflated well with air.  Thorough  examination of the gastric mucosa, including retroflexed view of the  proximal stomach and esophagogastric junction was undertaken.  The patient  has snakeskinning of  the gastric mucosa, consistent with portal gastropathy.  There were no varices and no ulcers.  Pylorus patent and easily traversed.  Examination of the bulb and second portion revealed no abnormalities.  The  scope was removed.  The seven-shot Microvasive banding unit was loaded.  The  scope was reintroduced into the esophagus, and a total of one bands were  deployed, one on each of the four columns.  Good hemostasis was maintained.  The patient tolerated the procedure well and was reactivated.   ENDOSCOPIC IMPRESSION:  1. Persisting esophageal varies, although much improved than previously      seen, status post esophageal band ligation today.  2. Portal gastropathy.  3. Remainder of upper gastrointestinal tract appeared normal.   RECOMMENDATIONS:  1. Soft diet for the next couple of days, then advance as tolerated.  2. We will plan for [name] to return in three months for one more EGD      to make sure that the varices have been obliterated.      [name], M.D.  Electronically  Signed     RMR/MEDQ  D:  12/22/2005  T:  12/23/2005  Job:  [id number]   cc:   Erle Crocker, M.D.  [facility]  [facility]   L[facility] Trans[facility]  Gastroenterology Department  [place] [facility]

## 2010-07-23 NOTE — Procedures (Signed)
NAME:  Nathan, HALLING NO.:  [id number]   MEDICAL RECORD NO.:  [id number]          PATIENT TYPE:  OUT   LOCATION:  RAD                           FACILITY:  [facility]   PHYSICIAN:  Vida Roller, M.D.   DATE OF BIRTH:  03-Nov-1945   DATE OF PROCEDURE:  05/30/2005  DATE OF DISCHARGE:                                  ECHOCARDIOGRAM   PRIMARY:  Dr. Jen Mow.   TODAY'S DATE:  May 30, 2005.   TAPE NUMBER:  [id number].   TAPE COUNT:  [id number].   HISTORY:  This is a 65 year old man for LV systolic function status post  bypass surgery in 2003.   TECHNICAL QUALITY:  Technical quality of the study is good.   M-MODE TRACINGS:  The aorta is 31 mm.   Left atrium is 55 mm.   Septum is 11 mm.   Posterior wall is 10 mm.   Left ventricular diastolic dimension 59 mm.   Left ventricular systolic dimension 47 mm.   2-D AND DOPPLER IMAGING:  The left ventricle is mildly dilated.  There is  depressed LV systolic function.  Estimated ejection fraction 45-50%.  There  is mild septal hypokinesis seen.   The right ventricle is dilated.  The right ventricular systolic function is  slightly impaired.  Assessment of the right ventricular systolic pressure is  beyond the limits of the study.   Both atria are dilated.   The aortic valve is sclerotic.  There is some frond-like density on the  ventricular side of the valve which is not well seen in multiple views, it  does not look like a vegetation or a thrombus but is difficult to assess.  There is no aortic stenosis.  There is trivial aortic insufficiency.   The mitral valve has mild annular calcification.  There is mild to moderate  regurgitation.  No stenosis is seen.   The tricuspid valve has mild to moderate regurgitation.  The velocity of the  regurgitation is about 3 m/sec.   There is no pericardial effusion.      Vida Roller, M.D.  Electronically Signed     JH/MEDQ  D:  05/30/2005  T:  05/31/2005  Job:   [id number]   cc:   Erle Crocker, MD

## 2010-07-23 NOTE — Discharge Summary (Signed)
NAME:  [name], [name] NO.:  [id number]   MEDICAL RECORD NO.:  [id number]                   PATIENT TYPE:  INP   LOCATION:  2036                                 FACILITY:  [facility]   PHYSICIAN:  Salvatore Decent. Dorris Fetch, M.D.         DATE OF BIRTH:  10/06/1945   DATE OF ADMISSION:  10/15/2002  DATE OF DISCHARGE:  10/19/2002                                 DISCHARGE SUMMARY   PRIMARY ADMITTING DIAGNOSIS:  Chest pain.   ADDITIONAL/DISCHARGE DIAGNOSES:  1. Severe three-vessel coronary artery disease.  2. Class II angina.  3. History of thrombocytopenia.  4. History of cirrhosis.  5. Type 2 non-insulin-dependent diabetes mellitus.  6. Gastroesophageal reflux disease.   PROCEDURES PERFORMED:  1. Coronary artery bypass grafting x3 (left internal mammary artery to the     left anterior descending, saphenous vein graft to the first diagonal,     saphenous vein graft to the first obtuse marginal).  2. Endoscopic vein harvest, right thigh, with open vein harvest from right     lower leg.   HISTORY:  The patient is a 65 year old male who recently has developed  symptoms of a severe burning, pressure-type substernal chest pressure with  exertion.  It is associated with shortness of breath.  He was seen in  consultation by Dr. Vida Roller and underwent cardiac catheterization  which showed severe three-vessel coronary artery disease with total  occlusion of his right coronary.  He was felt to be a good candidate for  surgical revascularization.  He was seen in the office by Dr. Viviann Spare C.  Dorris Fetch and it was agreed that he should proceed with coronary bypass  surgery at this time.   HOSPITAL COURSE:  He was admitted on October 15, 2002 and was taken to the  operating room by Dr. Dorris Fetch.  An attempt was made to perform an off-  pump coronary artery bypass surgery, however, near the completion of the  first distal anastomosis, the patient became  hypotensive and bradycardic.  He was hemodynamically unstable and therefore he was placed on  cardiopulmonary bypass for the remainder of the procedure.  He tolerated  this well and was weaned off bypass without difficulty.  At the completion  of the procedure, he was stable and he was transferred to the SICU.  He was  extubated shortly after surgery and was hemodynamically stable and doing  well on postop day 1.  He was later able to be transferred to the floor.  Postoperatively, he has done well.  He has been ambulating in the halls  without difficulty.  He has been weaned off supplemental oxygen and is  maintaining O2 saturations of greater than 90% on room air.  His surgical  incision sites are healing well.  He is back down to his preoperative  weight.  His postoperative labs have been stable with hemoglobin of 10,  hematocrit 30, white count 8.9, platelets 64,000, which  are consistent with  his chronic thrombocytopenia.  Sodium 136, potassium 4.5, BUN 25, creatinine  0.8.  Sugars have been stable on his home medication regimen.  He is  tolerating a regular diet and is having normal bowel and bladder function.  It is felt that if he continues to remain stable, he will be ready for  discharge home on October 19, 2002.   DISCHARGE MEDICATIONS:  1. Enteric-coated aspirin 325 mg daily.  2. Lopressor 25 mg b.i.d.  3. Altace 2.5 mg daily.  4. Protonix 40 mg daily.  5. Avandia 8 mg daily.  6. Glucophage 500 mg b.i.d.  7. Tylox one to two q.4h. p.r.n. for pain.   DISCHARGE INSTRUCTIONS:  He is to refrain from driving, heavy lifting or  strenuous activity.  He is asked to continue daily walking and using his  incentive spirometer.  He will continue a low-fat, low-sodium diet.  He is  asked to shower daily and clean his incisions with soap and water.   DISCHARGE FOLLOWUP:  An appointment will be scheduled for him to see Dr.  Dorethea Clan in two weeks and have a chest x-ray at that visit.  He  will then  follow up with Dr. Dorris Fetch on November 26, 2002 at 12:45 p.m.  He is  asked to bring his chest x-ray to this appointment for Dr. Dorris Fetch to  review.  He will call our office in the interim if he experiences any chest  pain, shortness of breath, redness, swelling or drainage from the incision  sites or fever greater than 101.       Coral Ceo, P.A.                        Salvatore Decent Dorris Fetch, M.D.    GC/MEDQ  D:  10/18/2002  T:  10/19/2002  Job:  [id number]   cc:   Patrica Duel, M.D.  [address], [address]  [place]  [place] [address]  Fax: [phone]   Vida Roller, M.D.  Fax: [phone]

## 2010-08-03 LAB — COMPLETE METABOLIC PANEL WITH GFR
BUN: 21 mg/dL (ref 4–21)
Creatine, Serum: 0.77
Potassium: 5 mmol/L
Sodium: 139 mmol/L (ref 137–147)

## 2010-08-03 LAB — HEMOGLOBIN A1C: Hgb A1c MFr Bld: 6.4 % — AB (ref 4.0–6.0)

## 2010-08-04 ENCOUNTER — Other Ambulatory Visit: Payer: Self-pay | Admitting: Gastroenterology

## 2010-08-04 DIAGNOSIS — K7460 Unspecified cirrhosis of liver: Secondary | ICD-10-CM

## 2010-08-06 HISTORY — PX: ESOPHAGOGASTRODUODENOSCOPY: SHX1529

## 2010-08-10 ENCOUNTER — Encounter: Payer: Medicare Other | Admitting: Internal Medicine

## 2010-08-10 ENCOUNTER — Ambulatory Visit (HOSPITAL_COMMUNITY)
Admission: RE | Admit: 2010-08-10 | Discharge: 2010-08-10 | Disposition: A | Discharge status: Home / Self Care | Payer: Medicare Other | Source: Ambulatory Visit | Attending: Internal Medicine | Admitting: Internal Medicine

## 2010-08-10 DIAGNOSIS — K319 Disease of stomach and duodenum, unspecified: Secondary | ICD-10-CM | POA: Insufficient documentation

## 2010-08-10 DIAGNOSIS — K766 Portal hypertension: Secondary | ICD-10-CM | POA: Insufficient documentation

## 2010-08-10 DIAGNOSIS — I8510 Secondary esophageal varices without bleeding: Secondary | ICD-10-CM | POA: Insufficient documentation

## 2010-08-10 DIAGNOSIS — K7460 Unspecified cirrhosis of liver: Secondary | ICD-10-CM | POA: Insufficient documentation

## 2010-08-10 DIAGNOSIS — I8500 Esophageal varices without bleeding: Secondary | ICD-10-CM

## 2010-08-10 DIAGNOSIS — Z951 Presence of aortocoronary bypass graft: Secondary | ICD-10-CM | POA: Insufficient documentation

## 2010-08-10 LAB — GLUCOSE, CAPILLARY: Glucose-Capillary: 97 mg/dL (ref 70–99)

## 2010-09-20 NOTE — Op Note (Signed)
  NAME:  [name], CALABRETTA NO.:  [id number]  MEDICAL RECORD NO.:  [id number]  LOCATION:  DAYP                          FACILITY:  [facility]  PHYSICIAN:  R. Roetta Sessions, MD FACP FACGDATE OF BIRTH:  04/08/1945  DATE OF PROCEDURE:  08/10/2010 DATE OF DISCHARGE:                              OPERATIVE REPORT   INDICATIONS FOR PROCEDURE:  A 65 year old with a history of NASH cirrhosis, esophageal varices, status post banding multiple sessions. Previously, he has done well.  He has delayed follow up until now.  EGD is now being done with therapeutic intent as appropriate.  Risks, benefits, limitations, alternatives and imponderables have been reviewed, questions answered.  Please see the documentation in the medical record.  PROCEDURE NOTE:  O2 saturation, blood pressure, pulse and respirations were monitored throughout the entire procedure.  CONSCIOUS SEDATION: 1. Versed 3 mg IV. 2. Demerol 50 mg IV in divided doses. 3. Cetacaine spray for topical pharyngeal anesthesia.  INSTRUMENT:  Pentax video chip system.  FINDINGS:  Examination of the tubular esophagus revealed scarring from previous band placement.  Esophageal varices look much better today. There are no more than 3 columns of grade 1 varices.  Overlying mucosa otherwise appeared normal.  EGD junction easily traversed.  Stomach: Gastric cavity was emptied and insufflated well with air.  Thorough examination of the gastric mucosa including retroflexed view of the proximal stomach, esophagogastric junction demonstrated small hiatal hernia and diffuse snake skinning, fish scale-appearance of gastric mucosa consistent with portal gastropathy.  Pylorus was patent, easily traversed.  Examination of the bulb and second portion revealed no abnormalities.  THERAPEUTIC/DIAGNOSTIC MANEUVERS PERFORMED:  None.  The patient tolerated the procedure well, was reactive to endoscopy.  IMPRESSION: 1. Three columns of grade 1  esophageal varices with overlying scar     from prior band placement.  No intervention performed today. 2. Portal gastropathy and hiatal hernia, normal D1 and D2.  RECOMMENDATIONS: 1. Continue now the long. 2. Planned to see this gentleman back in the office in 6 months. 3. Contemplate a repeat EGD in 18 months from now.     [name], MD Caleen Essex     RMR/MEDQ  D:  08/10/2010  T:  08/10/2010  Job:  [id number]  cc:   Catalina Pizza, M.D. Fax: [phone]  Electronically Signed by Lorrin Goodell M.D. on 09/20/2010 08:37:55 AM

## 2010-11-30 ENCOUNTER — Encounter: Payer: Self-pay | Admitting: Internal Medicine

## 2010-12-16 ENCOUNTER — Other Ambulatory Visit: Payer: Self-pay | Admitting: Gastroenterology

## 2010-12-16 LAB — PROTIME-INR: INR: 1.48 (ref <1.50)

## 2010-12-17 LAB — COMPREHENSIVE METABOLIC PANEL
AST: 35 U/L (ref 0–37)
Albumin: 3.5 g/dL (ref 3.5–5.2)
Alkaline Phosphatase: 90 U/L (ref 39–117)
BUN: 16 mg/dL (ref 6–23)
Potassium: 4.1 mEq/L (ref 3.5–5.3)

## 2010-12-17 LAB — CBC WITH DIFFERENTIAL/PLATELET
Basophils Absolute: 0 10*3/uL (ref 0.0–0.1)
Basophils Relative: 0 % (ref 0–1)
HCT: 33.5 % — ABNORMAL LOW (ref 39.0–52.0)
MCHC: 34 g/dL (ref 30.0–36.0)
Monocytes Absolute: 0.4 10*3/uL (ref 0.1–1.0)
Neutro Abs: 2.6 10*3/uL (ref 1.7–7.7)
Neutrophils Relative %: 64 % (ref 43–77)
RDW: 15.4 % (ref 11.5–15.5)

## 2010-12-21 NOTE — Progress Notes (Signed)
Quick Note:  Labs all stable.  Repeat CBC, CMET, AFP in six months.  Should be having abd u/s coming up soon. ______

## 2010-12-22 ENCOUNTER — Telehealth: Payer: Self-pay | Admitting: Gastroenterology

## 2010-12-23 ENCOUNTER — Other Ambulatory Visit: Payer: Self-pay | Admitting: Gastroenterology

## 2010-12-23 DIAGNOSIS — K7460 Unspecified cirrhosis of liver: Secondary | ICD-10-CM

## 2010-12-27 ENCOUNTER — Telehealth: Payer: Self-pay | Admitting: Gastroenterology

## 2010-12-27 ENCOUNTER — Other Ambulatory Visit: Payer: Self-pay | Admitting: Gastroenterology

## 2010-12-27 DIAGNOSIS — K7460 Unspecified cirrhosis of liver: Secondary | ICD-10-CM

## 2010-12-27 NOTE — Telephone Encounter (Signed)
Pts wife is aware of appt on 10/26 for AB [place]

## 2010-12-27 NOTE — Telephone Encounter (Signed)
Ab [place] scheduled for 10/26 @ 8- LMOVM w/ instructions

## 2010-12-27 NOTE — Progress Notes (Signed)
Pt is scheduled for [place] on 12/31/10 @ 8:00- LMOVM with instructions

## 2010-12-30 ENCOUNTER — Other Ambulatory Visit: Payer: Self-pay | Admitting: Internal Medicine

## 2010-12-30 DIAGNOSIS — K7460 Unspecified cirrhosis of liver: Secondary | ICD-10-CM

## 2010-12-30 NOTE — Progress Notes (Signed)
Quick Note:  inr stable.await abd u/s. ______

## 2010-12-31 ENCOUNTER — Ambulatory Visit (HOSPITAL_COMMUNITY)
Admission: RE | Admit: 2010-12-31 | Discharge: 2010-12-31 | Disposition: A | Discharge status: Home / Self Care | Payer: Medicare Other | Source: Ambulatory Visit | Attending: Internal Medicine | Admitting: Internal Medicine

## 2010-12-31 DIAGNOSIS — R161 Splenomegaly, not elsewhere classified: Secondary | ICD-10-CM | POA: Insufficient documentation

## 2010-12-31 DIAGNOSIS — K7460 Unspecified cirrhosis of liver: Secondary | ICD-10-CM

## 2011-01-04 NOTE — Progress Notes (Signed)
Quick Note:  Tried to call pt- NA ______ 

## 2011-05-31 ENCOUNTER — Other Ambulatory Visit: Payer: Self-pay | Admitting: Gastroenterology

## 2011-05-31 ENCOUNTER — Ambulatory Visit (HOSPITAL_COMMUNITY)
Admission: RE | Admit: 2011-05-31 | Discharge: 2011-05-31 | Disposition: A | Discharge status: Home / Self Care | Payer: Medicare Other | Source: Ambulatory Visit | Attending: Gastroenterology | Admitting: Gastroenterology

## 2011-05-31 ENCOUNTER — Encounter: Payer: Self-pay | Admitting: Gastroenterology

## 2011-05-31 ENCOUNTER — Ambulatory Visit (INDEPENDENT_AMBULATORY_CARE_PROVIDER_SITE_OTHER): Payer: Medicare Other | Admitting: Gastroenterology

## 2011-05-31 VITALS — BP 106/54 | HR 72 | Temp 98.3°F | Ht 67.0 in | Wt 195.2 lb

## 2011-05-31 DIAGNOSIS — R197 Diarrhea, unspecified: Secondary | ICD-10-CM

## 2011-05-31 DIAGNOSIS — R143 Flatulence: Secondary | ICD-10-CM

## 2011-05-31 DIAGNOSIS — R161 Splenomegaly, not elsewhere classified: Secondary | ICD-10-CM | POA: Insufficient documentation

## 2011-05-31 DIAGNOSIS — R140 Abdominal distension (gaseous): Secondary | ICD-10-CM

## 2011-05-31 DIAGNOSIS — K7460 Unspecified cirrhosis of liver: Secondary | ICD-10-CM

## 2011-05-31 DIAGNOSIS — R141 Gas pain: Secondary | ICD-10-CM

## 2011-05-31 DIAGNOSIS — R17 Unspecified jaundice: Secondary | ICD-10-CM | POA: Insufficient documentation

## 2011-05-31 DIAGNOSIS — R1110 Vomiting, unspecified: Secondary | ICD-10-CM

## 2011-05-31 DIAGNOSIS — R188 Other ascites: Secondary | ICD-10-CM | POA: Insufficient documentation

## 2011-05-31 DIAGNOSIS — R109 Unspecified abdominal pain: Secondary | ICD-10-CM | POA: Insufficient documentation

## 2011-05-31 DIAGNOSIS — K8020 Calculus of gallbladder without cholecystitis without obstruction: Secondary | ICD-10-CM | POA: Insufficient documentation

## 2011-05-31 LAB — COMPREHENSIVE METABOLIC PANEL
ALT: 18 U/L (ref 0–53)
Albumin: 3 g/dL — ABNORMAL LOW (ref 3.5–5.2)
CO2: 24 mEq/L (ref 19–32)
Calcium: 8.3 mg/dL — ABNORMAL LOW (ref 8.4–10.5)
Chloride: 108 mEq/L (ref 96–112)
Creat: 1.13 mg/dL (ref 0.50–1.35)
Potassium: 3.8 mEq/L (ref 3.5–5.3)
Sodium: 138 mEq/L (ref 135–145)
Total Protein: 6.3 g/dL (ref 6.0–8.3)

## 2011-05-31 MED ORDER — IOHEXOL 300 MG/ML  SOLN
100.0000 mL | Freq: Once | INTRAMUSCULAR | Status: AC | PRN
  Administered 2011-05-31: 100 mL via INTRAVENOUS

## 2011-05-31 NOTE — Patient Instructions (Signed)
Please get your blood work done at the hospital while you are there for CT abd/pelvis. Please drink at least 1 tbsp of fluid every 15 minutes to stay hydration. If persistent vomiting, please let me know.

## 2011-05-31 NOTE — Progress Notes (Signed)
Primary Care Physician: Dwana Melena, MD, MD  Primary Gastroenterologist:  Roetta Sessions, MD   Chief Complaint  Patient presents with  . Abdominal Pain  . Emesis  . Diarrhea    HPI: [name]. is a 66 y.o. male here for further evaluation of abdominal pain/distention, vomiting, diarrhea.  For several weeks flu-like symptoms, wife and himself. Fever, severe coughing (dry hacking). Went to Dr. Dwana Melena last week because of increased abdominal swelling, feeling SOB with bending over. Blood work done but has not heard anything yet. Last Thursday uniform fit but yesterday could not button pants. Abdomen continues to swell. Last night somteime during night, woke up thirsty, drank lot of water. Vomiting several times afterwards. This morning some diarrhea, four episodes. Stomach not as tight this morning. No melena, brbpr. No significant liquids or food since Sunday due to nausea and abdominal pain. Increased pressure in abdominal from swelling. Urine dark yellow/brown but chronically like this per patient.   Patient last seen in 06/2010. Last EGD in June of 2012. 3 columns of grade 1 esophageal varices with overlying scar from prior band placement. No intervention performed during EGD, June 2012. Portal gastropathy and hiatal hernia. Consider repeat EGD in December 2013. Last abdominal ultrasound 12/31/2010. Cirrhosis with splenomegaly and perihepatic/perisplenic ascites seen. No hepatoma noted.  Small gallstone is noted. Lab work due April 2013 for CBC, CMET, AFP, INR.   Current Outpatient Prescriptions  Medication Sig Dispense Refill  . atorvastatin (LIPITOR) 10 MG tablet Take 10 mg by mouth daily. 1/2 by mouth once daily       . FREESTYLE LITE test strip 1 strip daily.      . insulin glargine (LANTUS) 100 UNIT/ML injection Inject 10 Units into the skin at bedtime.        . isosorbide mononitrate (IMDUR) 30 MG 24 hr tablet Take 30 mg by mouth daily.        Marland Kitchen levothyroxine (SYNTHROID,  LEVOTHROID) 125 MCG tablet Take 125 mcg by mouth daily.        Marland Kitchen lisinopril (PRINIVIL,ZESTRIL) 2.5 MG tablet Take 2.5 mg by mouth daily.        . nadolol (CORGARD) 20 MG tablet Take 20 mg by mouth daily.        . sitaGLIPtan (JANUVIA) 100 MG tablet Take 100 mg by mouth daily. Takes tablet in the AM      . spironolactone (ALDACTONE) 25 MG tablet Take 25 mg by mouth daily. 1/2 by mouth 2 times daily         Allergies as of 05/31/2011  . (No Known Allergies)    ROS:  General: Positive for anorexia, chills, fatigue, weakness. Weight up 10 pounds since last OV one year ago. ENT: Negative for hoarseness, difficulty swallowing , nasal congestion. CV: Negative for chest pain, angina, palpitations, dyspnea on exertion, peripheral edema.  Respiratory: Negative for dyspnea at rest, dyspnea on exertion, sputum, wheezing. Nonproductive cough for several weeks. GI: See history of present illness. GU:  Negative for dysuria, hematuria, urinary incontinence, urinary frequency, nocturnal urination.  Endo: Negative for unusual weight change.    Physical Examination:   BP 106/54  Pulse 72  Temp(Src) 98.3 F (36.8 C) (Temporal)  Ht 5\' 7"  (1.702 m)  Wt 195 lb 3.2 oz (88.542 kg)  BMI 30.57 kg/m2  General: Well-nourished, well-developed in no acute distress. Multiple tattoos.  Eyes: ?icterus. Conjunctiva pink. Mouth: Oropharyngeal mucosa moist and pink , no lesions erythema or exudate. Lungs: Clear to auscultation  bilaterally.  Heart: Regular rate and rhythm, no murmurs rubs or gallops.  Abdomen: Bowel sounds are normal, abdomen distended especially in flanks but soft. Some shifting dullness to percussion. No fluid wave noted. Mild lower abdominal tenderness.  No abdominal bruits or hernia , no rebound or guarding.   Extremities: No lower extremity edema. No clubbing or deformities. Neuro: Alert and oriented x 4   Skin: Warm and dry, no jaundice.   Psych: Alert and cooperative, normal mood and  affect.  Labs:  05/26/2011. Sodium 139, potassium 4.4, glucose 133, BUN 16, creatinine 0.88, total bilirubin 1.9, alkaline phosphatase 99, AST 36, ALT 17, albumin 3.1, calcium 8.1, cholesterol 102, LDL 54, hemoglobin A1c 6.5, free T4-1.07, TSH 5.574.  Imaging Studies: No results found.

## 2011-05-31 NOTE — Progress Notes (Signed)
Faxed to PCP

## 2011-05-31 NOTE — Assessment & Plan Note (Signed)
Complaints of recent onset abdominal distention associated with abdominal pain. Developed vomiting and diarrhea overnight. Lab work from last week showed slightly elevated bilirubin 1.9. Today subjectively on exam he has mild scleral icterus. He has known gallstones which previously has been asymptomatic. Really do not get a history of biliary symptoms however. Would favor ileus versus partial small bowel obstruction versus ascites vs gastroenteritis. Today on exam his abdomen is distended especially in the flanks but no tense ascites at least.  Evaluate with CT of the abdomen and pelvis with contrast. Recheck LFTs, INR, AFP tumor marker, CBC.  He was encouraged to consume tablespoon of liquid every 15 minutes to maintain hydration. Currently no further vomiting but he has not attempted to eat. He'll let me know if he is unable to keep anything down. If symptoms worsen he should go to the emergency department. Further recommendations to follow pending studies.

## 2011-06-01 ENCOUNTER — Telehealth: Payer: Self-pay | Admitting: Gastroenterology

## 2011-06-01 LAB — CBC WITH DIFFERENTIAL/PLATELET
Basophils Absolute: 0 10*3/uL (ref 0.0–0.1)
Eosinophils Absolute: 0.1 10*3/uL (ref 0.0–0.7)
Eosinophils Relative: 2 % (ref 0–5)
MCH: 33.4 pg (ref 26.0–34.0)
MCHC: 33.9 g/dL (ref 30.0–36.0)
MCV: 98.6 fL (ref 78.0–100.0)
Monocytes Absolute: 0.4 10*3/uL (ref 0.1–1.0)
Platelets: 51 10*3/uL — ABNORMAL LOW (ref 150–400)
RDW: 16.3 % — ABNORMAL HIGH (ref 11.5–15.5)

## 2011-06-01 LAB — PROTIME-INR: Prothrombin Time: 21.4 seconds — ABNORMAL HIGH (ref 11.6–15.2)

## 2011-06-01 NOTE — Telephone Encounter (Signed)
See result note.  

## 2011-06-01 NOTE — Progress Notes (Signed)
Quick Note:  Please add direct bilirubin to labs drawn yesterday. ______

## 2011-06-01 NOTE — Telephone Encounter (Signed)
Patient is calling to get CT scan results please advise??

## 2011-06-01 NOTE — Progress Notes (Signed)
Quick Note:  Talked to Margot at [facility] lab - direct bilirubin added. ______

## 2011-06-01 NOTE — Progress Notes (Signed)
Quick Note:  Some thrombus in splenic vein, main portal vein, superior mesenteric vein. Frequently seen with cirrhosis. Also some fluid in the abdomen related to cirrhosis. No blockages or tumors.  Discussed with Dr. Jena Gauss. He recommends consultation with Dr. Mariel Sleet for thrombus (ASAP). They may not recommend blood thinners but we need at least their input.  How is vomiting/diarrhea??? ______

## 2011-06-01 NOTE — Progress Notes (Signed)
Quick Note:  Bilirubin up a little. Awaiting direct bilirubin.  Further recommendations to follow. ______

## 2011-06-02 ENCOUNTER — Other Ambulatory Visit: Payer: Self-pay | Admitting: Gastroenterology

## 2011-06-02 DIAGNOSIS — K7460 Unspecified cirrhosis of liver: Secondary | ICD-10-CM

## 2011-06-02 NOTE — Progress Notes (Signed)
Quick Note:  Pt aware to increase aldactone and that lab order is being faxed to lab. ______

## 2011-06-02 NOTE — Telephone Encounter (Signed)
LSL spoke with pt.

## 2011-06-02 NOTE — Progress Notes (Signed)
Quick Note:  Discussed with patient. He feels like swelling is better. No further vomiting.   Please make asap referral to Dr. Mariel Sleet regarding thrombus in splenic, main portal, superior mesenteric veins.  Repeat LFTs, inr. Met 7 in 10 days. Patient can increase aldactone to 50mg  daily for abdominal swelling.  2 g sodium diet. ______

## 2011-06-15 ENCOUNTER — Encounter (HOSPITAL_COMMUNITY): Payer: Medicare Other | Attending: Oncology | Admitting: Oncology

## 2011-06-15 ENCOUNTER — Encounter (HOSPITAL_COMMUNITY): Payer: Self-pay | Admitting: Oncology

## 2011-06-15 VITALS — BP 113/67 | HR 65 | Temp 97.4°F | Ht 67.0 in | Wt 185.0 lb

## 2011-06-15 DIAGNOSIS — K55059 Acute (reversible) ischemia of intestine, part and extent unspecified: Secondary | ICD-10-CM

## 2011-06-15 DIAGNOSIS — I81 Portal vein thrombosis: Secondary | ICD-10-CM

## 2011-06-15 DIAGNOSIS — I748 Embolism and thrombosis of other arteries: Secondary | ICD-10-CM

## 2011-06-15 DIAGNOSIS — K7460 Unspecified cirrhosis of liver: Secondary | ICD-10-CM

## 2011-06-15 NOTE — Progress Notes (Signed)
This office note has been dictated.

## 2011-06-15 NOTE — Patient Instructions (Signed)
[name].  [id number] [birth year]   [facility]  Discharge Instructions  RECOMMENDATIONS MADE BY THE CONSULTANT AND ANY TEST RESULTS WILL BE SENT TO YOUR REFERRING DOCTOR.   EXAM FINDINGS BY MD TODAY AND SIGNS AND SYMPTOMS TO REPORT TO CLINIC OR PRIMARY MD: Exam findings as discussed by Dr. Mariel Sleet.  INSTRUCTIONS GIVEN AND DISCUSSED:  1.  You can have your blood work done at the outpatient lab to save you a needle stick as requested.  [name]. wants you to have a CBC with differential, PT, PTT - you were given prescriptions for these tests to take with you.  2.  We will see you again in our office on April 22nd at 2pm.  Please feel free to call us for any questions.  I acknowledge that I have been informed and understand all the instructions given to me and received a copy. I do not have any more questions at this time, but understand that I may call the Specialty Clinic at [facility] at [phone] during business hours should I have any further questions or need assistance in obtaining follow-up care.    __________________________________________  _____________  __________ Signature of Patient or Authorized Representative            Date                   Time    __________________________________________ Nurse's Signature

## 2011-06-16 ENCOUNTER — Other Ambulatory Visit: Payer: Self-pay | Admitting: Gastroenterology

## 2011-06-16 LAB — HEPATIC FUNCTION PANEL
ALT: 15 U/L (ref 0–53)
Albumin: 2.9 g/dL — ABNORMAL LOW (ref 3.5–5.2)
Bilirubin, Direct: 0.5 mg/dL — ABNORMAL HIGH (ref 0.0–0.3)
Total Bilirubin: 1.7 mg/dL — ABNORMAL HIGH (ref 0.3–1.2)

## 2011-06-16 LAB — BASIC METABOLIC PANEL
BUN: 16 mg/dL (ref 6–23)
Chloride: 108 mEq/L (ref 96–112)
Creat: 0.85 mg/dL (ref 0.50–1.35)
Glucose, Bld: 133 mg/dL — ABNORMAL HIGH (ref 70–99)

## 2011-06-16 NOTE — Progress Notes (Signed)
CC:   Catalina Pizza, M.D. [name], MD FACP [facility]  DIAGNOSES: 1. 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 2. Splenomegaly secondary to cirrhosis complicated by of course     varices of the esophagus and upper abdominal area. 3. Ascites. 4. Viral-like illness lasting for approximately 3-4 weeks prior to the     development of the abdominal pain, distention of the abdomen,     diarrhea leading up to the CT scan on 05/31/2011. 5. Cholelithiasis. 6. Coronary artery disease status post coronary artery bypass graft. 7. Anemia of chronic disease. 8. Diabetes mellitus. 9. Hypothyroidism.  This is a pleasant gentleman who I saw many years ago for thrombocytopenia and found at that time that he had cirrhosis of the liver with secondary splenomegaly.  He presents here again after not seeing him for approximately 9 years with not only thrombocytopenia again, splenomegaly and cirrhosis of the liver which has probably gotten worse, but the above-mentioned nonocclusive thrombi in the splenic vein, portal vein and mesenteric vein.  He has had at least 2 variceal bleeds, 1 of which while he was on he says an anti-platelet agent consisting of aspirin and he thought 1 other drug.  He is not sure whether he was on an anti-platelet agent with his other variceal bleed.  He states it has been several years since his last variceal bleed.  He had banding he states of his varices by Dr. Jena Gauss with the most recent.  I do not have those records however.  He stated that he gained weight about 3 weeks ago.  He developed abdominal discomfort in the upper quadrants.  It was not radiating to the back or to the sides.  It was not associated with fever or chills at that immediate time, but his abdomen became very distended to the point that he could not button his slacks.  For the 3-4 weeks prior  to that, he had some chills, aching, weakness, fatigue, lethargy and habits of being up and about all the time changed to being close to the bed or the chair and taking it easy.  Since he has seen [facility]. [facility] and had the CAT scan, he is actually starting to feel better.  His abdomen distention is going down significantly.  He get his clothes back on now easily.  He is eating well.  His appetite is coming back, and he is not hurting anywhere near what he was.  SOCIAL HISTORY:  He still lives at home with his wife.  He is still active, cuts down trees, processes the wood, etc.  Overall his weight has been stable.  It varies somewhat between winter and summer, but overall it has been stable the last several years.  He has not seen any black tarry stools in the last several months, etc.  PHYSICAL EXAMINATION:  Very pleasant gentleman in no acute distress.  He is alert.  He is oriented.  He has multiple tattoos on his arms.  He has 1 on his left upper chest.  His tattoo on his left upper chest reads "dream as if you will live forever, live is if you will die today".  The others are of animals, boats, fish, etc.  He has some abdominal distention.  He has fullness in the left upper quadrant, but I cannot feel a distinct spleen tip.  I cannot  feel his liver edge.  I cannot feel distinct fluid, but I think there is a suggestion of fluid consistent with ascites.  His lungs though are clear.  His heart shows a regular rhythm and rate.  He does not have palmar erythema.  He does not have obvious petechiae.  He does not have spider angiomata that I can appreciate.  He has no adenopathy.  His heart shows no murmur or gallop at this time.  He has 1-2+ pitting edema below the knees.  Pulses are hard to feel in his feet.  He is alert and oriented.  Pupils equally round, reactive to light.  Throat is clear.  He has an upper dental plate and partial lower plate.  Mr. Merlos may have  chronic thrombi in these veins versus an acute episode, but I think the key is that these are nonocclusive and they certainly could be chronic.  He has varices.  He has some ascites.  He has splenomegaly.  He has cirrhosis of the liver and he has significant thrombocytopenia.  His blood counts are actually very reasonable as far as his hemoglobin is concerned.  His albumin is still decent at 3 g.  It was 3.5 g last October.  So what I have proposed to him is that we watch him only.  I think to put him on Lovenox or Coumadin or aspirin is realistically asking for trouble unless he is extremely symptomatic and right now he is getting better.  So I would like to see him back in a couple of weeks, sooner if need be, and I think if he gets worse he needs a repeat CT scan to see what these thrombi are doing.  I suspect these are truly related to his chronic liver disease more than anything else at this juncture.    ______________________________ Ladona Horns. Mariel Sleet, MD ESN/MEDQ  D:  06/15/2011  T:  06/16/2011  Job:  [id number]

## 2011-06-24 NOTE — Progress Notes (Signed)
Quick Note:  MELD 18, three weeks ago. Did not get inr this time, but Total bili better (up mostly from indirect), creatinine much better. Dr. Thornton Papas note reviewed.  Would recheck CMET/INR in four weeks, with ov with RMR to follow. ______

## 2011-06-27 ENCOUNTER — Encounter (HOSPITAL_BASED_OUTPATIENT_CLINIC_OR_DEPARTMENT_OTHER): Payer: Medicare Other | Admitting: Oncology

## 2011-06-27 VITALS — BP 120/68 | HR 55 | Temp 97.4°F | Wt 176.4 lb

## 2011-06-27 DIAGNOSIS — K7460 Unspecified cirrhosis of liver: Secondary | ICD-10-CM

## 2011-06-27 DIAGNOSIS — R161 Splenomegaly, not elsewhere classified: Secondary | ICD-10-CM

## 2011-06-27 DIAGNOSIS — D732 Chronic congestive splenomegaly: Secondary | ICD-10-CM

## 2011-06-27 DIAGNOSIS — D638 Anemia in other chronic diseases classified elsewhere: Secondary | ICD-10-CM

## 2011-06-27 NOTE — Progress Notes (Signed)
This office note has been dictated.

## 2011-06-27 NOTE — Patient Instructions (Signed)
[name].  [id number] 1946-02-07 Dr. Glenford Peers  [facility]  Discharge Instructions  RECOMMENDATIONS MADE BY THE CONSULTANT AND ANY TEST RESULTS WILL BE SENT TO YOUR REFERRING DOCTOR.   Return to clinic in 6-7 months. Call prior to that if any issues or concerns.     I acknowledge that I have been informed and understand all the instructions given to me and received a copy. I do not have any more questions at this time, but understand that I may call the Specialty Clinic at [facility] at [phone] during business hours should I have any further questions or need assistance in obtaining follow-up care.    __________________________________________  _____________  __________ Signature of Patient or Authorized Representative            Date                   Time    __________________________________________ Nurse's Signature

## 2011-06-28 NOTE — Progress Notes (Signed)
CC:   Catalina Pizza, M.D.  DIAGNOSES: 1. Cirrhosis of the liver, complicated by varices, ascites,     splenomegaly, and nonocclusive thrombi of the proximal splenic     vein, main portal vein, and superior mesenteric vein due to blood     sludging. 2. Probable viral-like illness lasting 3 to 4 weeks prior to the     development of this abdominal pain, distention of the abdomen, and     diarrhea leading up to the CT scan on 05/31/2011 and I suspect this     is the reason he had the worsening of his symptomatology and his     abnormalities partly on this most recent CT scan, especially the     sludging of the blood. 3. Splenomegaly secondary to cirrhosis, complicated by the varices of     the esophagus and upper abdominal area. 4. Anemia of chronic disease. 5. Coronary artery disease, status post coronary artery bypass graft. 6. Diabetes mellitus. [name] is absolutely doing better.  He feels better, looks better.  He has lost some more weight.  He is probably down to a much better weight today.  I think this is a much better weight for him and I have told him so.  He is going to try to maintain this.  He was as much as 195 and I think that is too much for his height and his liver disease.  He is changing his diet, he states, because his taste buds have changed and he does not want some of the junk food he used to have.  So we are going to just see him back in 6 or 7 months, sooner if need be.  I would not again treat these thromboses unless they are extremely symptomatic and much worse realistically.  He understands that, willing to proceed with this plan, and he will call us if need be.    ______________________________ Ladona Horns. Mariel Sleet, MD ESN/MEDQ  D:  06/27/2011  T:  06/27/2011  Job:  [id number]

## 2011-06-30 ENCOUNTER — Other Ambulatory Visit: Payer: Self-pay | Admitting: Gastroenterology

## 2011-06-30 ENCOUNTER — Other Ambulatory Visit: Payer: Self-pay

## 2011-06-30 DIAGNOSIS — K7460 Unspecified cirrhosis of liver: Secondary | ICD-10-CM

## 2011-07-18 ENCOUNTER — Encounter: Payer: Self-pay | Admitting: Oncology

## 2011-07-26 ENCOUNTER — Ambulatory Visit (INDEPENDENT_AMBULATORY_CARE_PROVIDER_SITE_OTHER): Payer: Medicare Other | Admitting: Internal Medicine

## 2011-07-26 ENCOUNTER — Other Ambulatory Visit: Payer: Self-pay

## 2011-07-26 ENCOUNTER — Encounter: Payer: Self-pay | Admitting: Internal Medicine

## 2011-07-26 VITALS — BP 111/61 | HR 57 | Temp 97.4°F | Ht 67.0 in | Wt 181.4 lb

## 2011-07-26 DIAGNOSIS — K7460 Unspecified cirrhosis of liver: Secondary | ICD-10-CM

## 2011-07-26 NOTE — Patient Instructions (Addendum)
Resume spironolactone 25 mg twice daily  BMET, lfts in 1 month  OV to set up EGD December 2013  afp and hepatic ultrsound in September 2013

## 2011-07-26 NOTE — Progress Notes (Signed)
Primary Care Physician:  Dwana Melena, MD, MD Primary Gastroenterologist:  Dr. Jena Gauss  Pre-Procedure History & Physical: HPI:  Nathan Brosh. is a 66 y.o. male here for followup of Nash/cirrhosis. Was sick for about a month back in March. Felt to have a viral syndrome. Did gain some weight fairly acutely. CT demonstrated new splenic and partial portal vein thrombosis. Saw Dr. Mariel Sleet. We increased his Aldactone for lower extremity edema he ran out of that prescription the pharmacy would not refill. He states he did not call in. He feels he is back to baseline at this time. He weighs 181 pounds today; weight 195 pounds when he was here in March. He tells me he dropped to 175 pounds transiently since his last office visit but is now back up to 181.4; he weighed 190 pounds in 2009 in this office. He is up-to-date on hepatoma screening. Alpha-fetoprotein was normal 3 months ago. He'll be due for a surveillance EGD in December of this year. Last colonoscopy 2011. Recent LFTs demonstrate a stable elevated bilirubin of 1.7. Renal function normal. Sodium and potassium normal. Blood sugar 133. He tells me he stopped taking his insulin on his  own is really watching his diet. He has been immunized against hepatitis A and B.  He is taking Naldolol. Past Medical History  Diagnosis Date  . Anemia     thrombocytopenia  . History of transfusion of whole blood   . Cirrhosis of liver     NASH, vaccinated for HepA/B  . Coronary artery disease 2004    s/p CAGB  . Hyperlipidemia   . Hypothyroidism   . GERD (gastroesophageal reflux disease)   . Atherosclerosis   . Overactive bladder   . Erectile dysfunction   . Gynecomastia, male     RIGHT  . Thrombocytopenia   . DM (diabetes mellitus)   . Esophageal varices     Last EGD 05/13/10, three columns grade II to III EV s/p banding, second banding since 01/2010.>Portal gastropathy.. >5 banding sessions in the past.  . Spleen enlarged   . Thrombus     splenic,  portal and smv    Past Surgical History  Procedure Date  . Esophagogastroduodenoscopy 01/2010    4 COLUMNS 2-3 GRADE ESOPHAGEAL VARICES,S/P BANDING,PORTAL GASTROPATHY  . Coronary artery bypass graft 2004  . Cardiac catheterization 10/09/06  . Colonoscopy 01/2010    portal colopathy, sigmoid AVM (nonbleeding)  . Esophagogastroduodenoscopy June 2012    Dr. Jonique Kulig-->3 columns of grade 1 esophageal varices with overlying scar, no banding treatment required, portal gastropathy. Next EGD in December 2013    Prior to Admission medications   Medication Sig Start Date End Date Taking? Authorizing Provider  atorvastatin (LIPITOR) 10 MG tablet Take 10 mg by mouth daily. 1/2 by mouth once daily    Yes Historical Provider, MD  isosorbide mononitrate (IMDUR) 30 MG 24 hr tablet Take 30 mg by mouth daily.     Yes Historical Provider, MD  levothyroxine (SYNTHROID, LEVOTHROID) 137 MCG tablet Take 137 mcg by mouth daily.  07/01/11  Yes Historical Provider, MD  lisinopril (PRINIVIL,ZESTRIL) 2.5 MG tablet Take 2.5 mg by mouth daily.     Yes Historical Provider, MD  nadolol (CORGARD) 20 MG tablet Take 20 mg by mouth daily.     Yes Historical Provider, MD  sitaGLIPtan (JANUVIA) 100 MG tablet Take 100 mg by mouth daily. Takes tablet in the AM   Yes Historical Provider, MD  spironolactone (ALDACTONE) 25 MG tablet Take 50  mg by mouth daily.    Yes Historical Provider, MD  FREESTYLE LITE test strip 1 strip daily. 05/20/10   Historical Provider, MD  insulin glargine (LANTUS) 100 UNIT/ML injection Inject 10 Units into the skin at bedtime. Has not taken any insulin for a couple of weeks    Historical Provider, MD    Allergies as of 07/26/2011  . (No Known Allergies)    Family History  Problem Relation Age of Onset  . Stroke Father 15  . Heart disease Mother 64    CHF  . Heart attack Brother   . Liver disease Neg Hx   . Colon cancer Neg Hx     History   Social History  . Marital Status: Married    Spouse  Name: N/A    Number of Children: 4  . Years of Education: N/A   Occupational History  .     Social History Main Topics  . Smoking status: Never Smoker   . Smokeless tobacco: Never Used  . Alcohol Use: 0.0 oz/week     occasionally  . Drug Use: No  . Sexually Active: Not Currently   Other Topics Concern  . Not on file   Social History Narrative  . No narrative on file    Review of Systems: See HPI, otherwise negative ROS  Physical Exam: BP 111/61  Pulse 57  Temp(Src) 97.4 F (36.3 C) (Temporal)  Ht 5\' 7"  (1.702 m)  Wt 181 lb 6.4 oz (82.283 kg)  BMI 28.41 kg/m2 General:   Alert,  Well-developed, well-nourished, pleasant and cooperative in NAD. Well oriented Skin:  Intact without significant lesions or rashes. Multiple tattoos Eyes:  Sclera clear, no icterus.   Conjunctiva pink. Ears:  Normal auditory acuity. Nose:  No deformity, discharge,  or lesions. Mouth:  No deformity or lesions. Neck:  Supple; no masses or thyromegaly. No significant cervical adenopathy. Lungs:  Clear throughout to auscultation.   No wheezes, crackles, or rhonchi. No acute distress. Heart:  Regular rate and rhythm; no murmurs, clicks, rubs,  or gallops. Abdomen: Non-distended, normal bowel sounds.   Pulses:  Normal pulses noted. Extremities:  1+ ankle and pretibial edema

## 2011-07-26 NOTE — Assessment & Plan Note (Signed)
Very pleasant 66 year old gentleman with Nash/cirrhosis. Possible viral syndrome back in March of this year but with a new splenic vein, etc thrombosis which he did not have previously. I wonder if this accounted for some of his abdominal bloating and third spacing acutely. Ran out of his diuretic medication couple of weeks ago.  To me, he appears back to his recent baseline aside from some lower extremity edema.  Recommendations: Resume Aldactone at a dose of 25 mg orally twice daily.  We'll obtain lab work one month from now. We'll have him back to the office in December of this year to set up a surveillance EGD  We'll draw an alpha-fetoprotein and check an ultrasound of his liver in September of this year.

## 2011-08-08 ENCOUNTER — Ambulatory Visit (INDEPENDENT_AMBULATORY_CARE_PROVIDER_SITE_OTHER): Payer: Medicare Other | Admitting: Urgent Care

## 2011-08-08 ENCOUNTER — Other Ambulatory Visit: Payer: Self-pay

## 2011-08-08 ENCOUNTER — Encounter: Payer: Self-pay | Admitting: Urgent Care

## 2011-08-08 VITALS — BP 127/61 | HR 62 | Temp 98.4°F | Ht 67.0 in | Wt 187.2 lb

## 2011-08-08 DIAGNOSIS — K7460 Unspecified cirrhosis of liver: Secondary | ICD-10-CM

## 2011-08-08 DIAGNOSIS — R609 Edema, unspecified: Secondary | ICD-10-CM

## 2011-08-08 LAB — HEPATIC FUNCTION PANEL
ALT: 16 U/L (ref 0–53)
AST: 35 U/L (ref 0–37)
Albumin: 2.8 g/dL — ABNORMAL LOW (ref 3.5–5.2)
Alkaline Phosphatase: 88 U/L (ref 39–117)
Bilirubin, Direct: 0.5 mg/dL — ABNORMAL HIGH (ref 0.0–0.3)
Indirect Bilirubin: 1.3 mg/dL — ABNORMAL HIGH (ref 0.0–0.9)
Total Bilirubin: 1.8 mg/dL — ABNORMAL HIGH (ref 0.3–1.2)
Total Protein: 6.4 g/dL (ref 6.0–8.3)

## 2011-08-08 LAB — BASIC METABOLIC PANEL
Calcium: 8.1 mg/dL — ABNORMAL LOW (ref 8.4–10.5)
Glucose, Bld: 151 mg/dL — ABNORMAL HIGH (ref 70–99)
Potassium: 4.1 mEq/L (ref 3.5–5.3)
Sodium: 139 mEq/L (ref 135–145)

## 2011-08-08 MED ORDER — FUROSEMIDE 20 MG PO TABS: 20.0000 mg | ORAL_TABLET | Freq: Every day | ORAL | Status: DC

## 2011-08-08 NOTE — Progress Notes (Signed)
Faxed to PCP

## 2011-08-08 NOTE — Progress Notes (Signed)
Primary Care Physician:  Dwana Melena, MD, MD Primary Gastroenterologist:  Dr. Jena Gauss  HPI:  [name]. is a 66 y.o. male here as a work in for lower extremity edema. He has noticed his feet & ankles starting swelling since last OV 5/21 with Dr. Jena Gauss.  C/o feeling tight abdomen. He tells me he is taking his Aldactone as ordered 25 mg twice a day.  If he wakes in the morning, he has very little edema, however his ankles and feet become worse during the day.  Denies SOB.  Feels he may have wheezing.  Denies any chest pain. Urinating fine.  Urine somewhat darker than normal.  Denies jaundice or pruritis.  Denies new medications.  Denies fever or chills.  Denies abdominal pain, nausea or vomiting.  BM QD or BID.  Denies rectal bleeding or melena.  Wt is up almost 6 pounds since last office visit.  He has history of splenic and partial portal vein thrombosis for which she saw Dr. Jerelyn Scott. He was felt not to be a candidate for anticoagulation due to the risks.  He had labs drawn this morning including Met 7 & LFTs.  Due for a surveillance EGD in December of this year. Due for alpha-fetoprotein and abdominal ultrasound September 2013. He has been immunized against hepatitis A and B.  He is taking Naldolol. 24 hour Dietary recall: B-egg & english muffin L-no lunch S-fruit popsicle, cereal D-potatoes, peppers, italian sausage Beverages-Bottled water 2 per day Ham sandwich the other day for lunch  Vitals - 1 value per visit 08/08/2011 07/26/2011 06/27/2011 06/15/2011 05/31/2011  Weight (lb) 187.2 181.4 176.4 185 195.2   Past Medical History  Diagnosis Date  . Anemia     thrombocytopenia  . History of transfusion of whole blood   . Cirrhosis of liver     NASH, vaccinated for HepA/B  . Coronary artery disease 2004    s/p CAGB  . Hyperlipidemia   . Hypothyroidism   . GERD (gastroesophageal reflux disease)   . Atherosclerosis   . Overactive bladder   . Erectile dysfunction   . Gynecomastia, male       RIGHT  . Thrombocytopenia   . DM (diabetes mellitus)   . Esophageal varices     Last EGD 05/13/10, three columns grade II to III EV s/p banding, second banding since 01/2010.>Portal gastropathy.. >5 banding sessions in the past.  . Spleen enlarged   . Thrombus     Chronic splenic, portal and smv    Past Surgical History  Procedure Date  . Esophagogastroduodenoscopy 01/2010    4 COLUMNS 2-3 GRADE ESOPHAGEAL VARICES,S/P BANDING,PORTAL GASTROPATHY  . Coronary artery bypass graft 2004  . Cardiac catheterization 10/09/06  . Colonoscopy 01/2010    portal colopathy, sigmoid AVM (nonbleeding)  . Esophagogastroduodenoscopy June 2012    Dr. Rourk-->3 columns of grade 1 esophageal varices with overlying scar, no banding treatment required, portal gastropathy. Next EGD in December 2013   Current Outpatient Prescriptions  Medication Sig Dispense Refill  . atorvastatin (LIPITOR) 10 MG tablet Take 10 mg by mouth daily. 1/2 by mouth once daily       . FREESTYLE LITE test strip 1 strip daily.      . isosorbide mononitrate (IMDUR) 30 MG 24 hr tablet Take 30 mg by mouth daily.        Marland Kitchen levothyroxine (SYNTHROID, LEVOTHROID) 137 MCG tablet Take 137 mcg by mouth daily.       Marland Kitchen lisinopril (PRINIVIL,ZESTRIL) 2.5 MG tablet  Take 2.5 mg by mouth daily.        . nadolol (CORGARD) 20 MG tablet Take 20 mg by mouth daily.        . sitaGLIPtan (JANUVIA) 100 MG tablet Take 100 mg by mouth daily. Takes tablet in the AM      . spironolactone (ALDACTONE) 25 MG tablet Take 50 mg by mouth daily.       . furosemide (LASIX) 20 MG tablet Take 1 tablet (20 mg total) by mouth daily.  30 tablet  0   Allergies as of 08/08/2011  . (No Known Allergies)   Review of Systems: Gen: Denies any fever, chills, sweats, anorexia, fatigue, weakness, malaise, weight loss, and sleep disorder CV: Denies chest pain, angina, palpitations, syncope, orthopnea, PND, peripheral edema, and claudication. Resp: Denies dyspnea at rest, dyspnea  with exercise, cough, sputum, wheezing, coughing up blood, and pleurisy. GI: Denies vomiting blood, jaundice, and fecal incontinence.   Denies dysphagia or odynophagia. Derm: Complains of recent tick bite to left wrist however no rash, exudates or erythema. Denies rash, itching, dry skin, hives, moles, warts, or unhealing ulcers.  Psych: Denies depression, anxiety, memory loss, suicidal ideation, hallucinations, paranoia, and confusion. Heme: Denies bruising, bleeding, and enlarged lymph nodes.  Physical Exam: BP 127/61  Pulse 62  Temp(Src) 98.4 F (36.9 C) (Temporal)  Ht 5\' 7"  (1.702 m)  Wt 187 lb 3.2 oz (84.913 kg)  BMI 29.32 kg/m2 General:   Alert,  Well-developed, well-nourished, pleasant and cooperative in NAD. Skin:  Intact without significant lesions or rashes. Multiple tattoos. Eyes:  Sclera clear, no icterus.   Conjunctiva pink. Ears:  Normal auditory acuity. Nose:  No deformity, discharge,  or lesions. Mouth:  No deformity or lesions. Neck:  Supple; no masses or thyromegaly. No significant cervical adenopathy. Lungs:  Clear throughout to auscultation.   No wheezes, crackles, or rhonchi. No acute distress. Heart:  Regular rate and rhythm; no murmurs, clicks, rubs,  or gallops. Abdomen: Positive bowel sounds x4, no bruits auscultated. Soft nontender nondistended. No tense ascites or significant fluid wave. He does have splenomegaly. No rebound tenderness or guarding.   Extremities:  1+ ankle and pretibial edema bilaterally.

## 2011-08-08 NOTE — Assessment & Plan Note (Addendum)
[name]. is a pleasant 66 y.o. male here for urgent work in for weight gain and lower extremity edema. Weight is up 6 pounds. He has history of Nash cirrhosis. He does have history of esophageal varices and splenic vein/SMV thrombosis. He has been taking Aldactone 25 mg twice a day. Significant dietary indiscretion regarding sodium content. We discussed this at length today. Will add Lasix.  Await his met 7 and LFTs.  Begin Lasix 20 mg daily  Low sodium diet given  abdominal ultrasound and AFP September 2013 EGD for history of esophageal varices December 2013 Call if you develop shortness of breath, weakness or worsening edema.

## 2011-08-08 NOTE — Progress Notes (Signed)
Pt had these done today.

## 2011-08-08 NOTE — Patient Instructions (Signed)
Begin Lasix 20 mg daily  You should watch your salt intake and avoid meats like sausage and ham as they have too much sodium  We will call as soon as labs are back Call me with your medication dosages & how you are taking them when you get home-Aldactone is the one I am most concerned with You need abdominal ultrasound and AFP September 2013 2 Gram Low Sodium Diet A 2 gram sodium diet restricts the amount of sodium in the diet to no more than 2 g or 2000 mg daily. Limiting the amount of sodium is often used to help lower blood pressure. It is important if you have heart, liver, or kidney problems. Many foods contain sodium for flavor and sometimes as a preservative. When the amount of sodium in a diet needs to be low, it is important to know what to look for when choosing foods and drinks. The following includes some information and guidelines to help make it easier for you to adapt to a low sodium diet. QUICK TIPS  Do not add salt to food.   Avoid convenience items and fast food.   Choose unsalted snack foods.   Buy lower sodium products, often labeled as "lower sodium" or "no salt added."   Check food labels to learn how much sodium is in 1 serving.   When eating at a restaurant, ask that your food be prepared with less salt or none, if possible.  READING FOOD LABELS FOR SODIUM INFORMATION The nutrition facts label is a good place to find how much sodium is in foods. Look for products with no more than 500 to 600 mg of sodium per meal and no more than 150 mg per serving. Remember that 2 g = 2000 mg. The food label may also list foods as:  Sodium-free: Less than 5 mg in a serving.   Very low sodium: 35 mg or less in a serving.   Low-sodium: 140 mg or less in a serving.   Light in sodium: 50% less sodium in a serving. For example, if a food that usually has 300 mg of sodium is changed to become light in sodium, it will have 150 mg of sodium.   Reduced sodium: 25% less sodium in a  serving. For example, if a food that usually has 400 mg of sodium is changed to reduced sodium, it will have 300 mg of sodium.  CHOOSING FOODS Grains  Avoid: Salted crackers and snack items. Some cereals, including instant hot cereals. Bread stuffing and biscuit mixes. Seasoned rice or pasta mixes.   Choose: Unsalted snack items. Low-sodium cereals, oats, puffed wheat and rice, shredded wheat. English muffins and bread. Pasta.  Meats  Avoid: Salted, canned, smoked, spiced, pickled meats, including fish and poultry. Bacon, ham, sausage, cold cuts, hot dogs, anchovies.   Choose: Low-sodium canned tuna and salmon. Fresh or frozen meat, poultry, and fish.  Dairy  Avoid: Processed cheese and spreads. Cottage cheese. Buttermilk and condensed milk. Regular cheese.   Choose: Milk. Low-sodium cottage cheese. Yogurt. Sour cream. Low-sodium cheese.  Fruits and Vegetables  Avoid: Regular canned vegetables. Regular canned tomato sauce and paste. Frozen vegetables in sauces. Olives. Rosita Fire. Relishes. Sauerkraut.   Choose: Low-sodium canned vegetables. Low-sodium tomato sauce and paste. Frozen or fresh vegetables. Fresh and frozen fruit.  Condiments  Avoid: Canned and packaged gravies. Worcestershire sauce. Tartar sauce. Barbecue sauce. Soy sauce. Steak sauce. Ketchup. Onion, garlic, and table salt. Meat flavorings and tenderizers.   Choose: Fresh and  dried herbs and spices. Low-sodium varieties of mustard and ketchup. Lemon juice. Tabasco sauce. Horseradish.  SAMPLE 2 GRAM SODIUM MEAL PLAN Breakfast / Sodium (mg)  1 cup low-fat milk / 143 mg   2 slices whole-wheat toast / 270 mg   1 tbs heart-healthy margarine / 153 mg   1 hard-boiled egg / 139 mg   1 small orange / 0 mg  Lunch / Sodium (mg)  1 cup raw carrots / 76 mg    cup hummus / 298 mg   1 cup low-fat milk / 143 mg    cup red grapes / 2 mg   1 whole-wheat pita bread / 356 mg  Dinner / Sodium (mg)  1 cup whole-wheat pasta  / 2 mg   1 cup low-sodium tomato sauce / 73 mg   3 oz lean ground beef / 57 mg   1 small side salad (1 cup raw spinach leaves,  cup cucumber,  cup yellow bell pepper) with 1 tsp olive oil and 1 tsp red wine vinegar / 25 mg  Snack / Sodium (mg)  1 container low-fat vanilla yogurt / 107 mg   3 graham cracker squares / 127 mg  Nutrient Analysis  Calories: 2033   Protein: 77 g   Carbohydrate: 282 g   Fat: 72 g   Sodium: 1971 mg  Document Released: 02/21/2005 Document Revised: 02/10/2011 Document Reviewed: 05/25/2009 [facility] Patient Information 2012 [place], [place].

## 2011-08-09 NOTE — Progress Notes (Signed)
Quick Note:  Please let patient know his lab work is stable. He can continue Lasix and Aldactone as discussed yesterday. He should have a recheck met 7 in one week. Weight daily. Call if he continues to gain significant weight or has shortness of breath or tight belly. Did INR. get done? CC: HALL,ZACK, MD  ______

## 2011-08-10 ENCOUNTER — Other Ambulatory Visit: Payer: Self-pay

## 2011-08-10 DIAGNOSIS — K7460 Unspecified cirrhosis of liver: Secondary | ICD-10-CM

## 2011-08-11 ENCOUNTER — Other Ambulatory Visit: Payer: Self-pay

## 2011-08-11 DIAGNOSIS — K7460 Unspecified cirrhosis of liver: Secondary | ICD-10-CM

## 2011-08-17 ENCOUNTER — Telehealth: Payer: Self-pay | Admitting: Urgent Care

## 2011-08-17 NOTE — Telephone Encounter (Signed)
Pt non-compliant. Did not get Met 7 & LFTs. Please send do not neglect your health letter. Thanks

## 2011-08-17 NOTE — Telephone Encounter (Signed)
Called Pt he has been work and forgot about it but his wife is going to make him go in the morning.

## 2011-08-18 LAB — BASIC METABOLIC PANEL
BUN: 21 mg/dL (ref 6–23)
CO2: 25 mEq/L (ref 19–32)
Chloride: 109 mEq/L (ref 96–112)
Glucose, Bld: 98 mg/dL (ref 70–99)
Potassium: 3.9 mEq/L (ref 3.5–5.3)
Sodium: 140 mEq/L (ref 135–145)

## 2011-08-19 NOTE — Progress Notes (Signed)
Quick Note:  Please call to let pt know blood count stable. Keep FU as planned. Call if any problems. Thanks ZO:XWRU,EAVW, MD  ______

## 2011-08-22 NOTE — Progress Notes (Signed)
Quick Note:  Tried to call pt- LM with his wife. ______

## 2011-09-06 LAB — COMPREHENSIVE METABOLIC PANEL
Alkaline Phosphatase: 98 U/L
BUN: 21 mg/dL (ref 4–21)
CO2: 27 mmol/L
Cholesterol: 1 mg/dL (ref 0–200)
Creat: 0.84
Free T4: 1.02
Glucose: 121
Hgb A1c MFr Bld: 5.8 % (ref 4.0–6.0)
Sodium: 143 mmol/L (ref 137–147)
TSH: 5.131
Total Bilirubin: 2 mg/dL
Total Protein: 6.7 g/dL

## 2011-09-19 NOTE — Progress Notes (Signed)
Quick Note:  Reviewed. ______ 

## 2011-09-22 NOTE — Progress Notes (Signed)
REVIEWED.  

## 2011-10-03 ENCOUNTER — Telehealth: Payer: Self-pay | Admitting: Internal Medicine

## 2011-10-03 NOTE — Telephone Encounter (Signed)
Spoke with pt- he is having a lot of fluid in his abd, feet and legs, his lower abd is hurting and he is having increased SOB. He is taking lasix 20 mg bid and aldactone 25mg  bid. He stated he was very uncomfortable. His weight on Friday was 190 lbs. Pt has ov with RMR on 10/11/11. SS offered pt appt today with AS and pt refused, he only wants to see RMR. Please advise.

## 2011-10-03 NOTE — Telephone Encounter (Signed)
Pt called this morning. He has OV next Tuesday with RMR and said he was feeling miserable due to swelling in feet,legs and abdomen and wanted to know if he could be seen sooner than next week. Does he need to see RMR ONLY or can one of the extenders see him if I have a cancellation during the week? [phone]

## 2011-10-03 NOTE — Telephone Encounter (Signed)
Pt does not want to see extender. He wants RMR only. I told patient that I had some scheduling changes to RMR's schedule and if he could come at 2 instead of 1030. Patient said he would if that was the only way to see RMR.

## 2011-10-03 NOTE — Telephone Encounter (Signed)
Pt is aware that RMR is out of town until 8/6

## 2011-10-03 NOTE — Telephone Encounter (Signed)
Pt should be seen sooner, however if he refuses extenders that is his choice. Is he aware that RMR is out of town? May need to address w/ Durward Mallard.

## 2011-10-11 ENCOUNTER — Ambulatory Visit: Payer: Medicare Other | Admitting: Internal Medicine

## 2011-10-11 ENCOUNTER — Ambulatory Visit (INDEPENDENT_AMBULATORY_CARE_PROVIDER_SITE_OTHER): Payer: Medicare Other | Admitting: Internal Medicine

## 2011-10-11 ENCOUNTER — Encounter: Payer: Self-pay | Admitting: Internal Medicine

## 2011-10-11 VITALS — BP 120/61 | HR 58 | Temp 98.3°F | Ht 67.0 in | Wt 199.2 lb

## 2011-10-11 DIAGNOSIS — R188 Other ascites: Secondary | ICD-10-CM

## 2011-10-11 DIAGNOSIS — R601 Generalized edema: Secondary | ICD-10-CM

## 2011-10-11 DIAGNOSIS — R609 Edema, unspecified: Secondary | ICD-10-CM

## 2011-10-11 LAB — CBC WITH DIFFERENTIAL/PLATELET
Basophils Absolute: 0 10*3/uL (ref 0.0–0.1)
Eosinophils Relative: 3 % (ref 0–5)
Lymphocytes Relative: 23 % (ref 12–46)
Lymphs Abs: 1.2 10*3/uL (ref 0.7–4.0)
MCV: 95.4 fL (ref 78.0–100.0)
Neutrophils Relative %: 59 % (ref 43–77)
Platelets: 65 10*3/uL — ABNORMAL LOW (ref 150–400)
RBC: 3.26 MIL/uL — ABNORMAL LOW (ref 4.22–5.81)
RDW: 15.3 % (ref 11.5–15.5)
WBC: 5.1 10*3/uL (ref 4.0–10.5)

## 2011-10-11 NOTE — Patient Instructions (Signed)
Proceed with a contrast CT of abdomen and pelvis tomorrow to reevaluate ascites and splenic vein thrombosis; patient is to have a therapeutic paracentesis following the CT scan.  No change in medication regimen yet.

## 2011-10-11 NOTE — Progress Notes (Signed)
Primary Care Physician:  Dwana Melena, MD Primary Gastroenterologist:  Dr. Jena Gauss  Pre-Procedure History & Physical: HPI:  [name]. is a 66 y.o. male here for here for progressive anasarca/Nash/cirrhosis. Dr. Margo Aye increased Lasix to 20 g orally twice a day one month ago. Patient has been adherent to salt restricted diet. He continues on Aldactone 25 mg orally twice daily. Weight up 12 pounds in the past couple of weeks. History splenic vein thrombosis. No reimaging since March of this year.  Comprehensive metabolic profile lipid from one month ago.  Past Medical History  Diagnosis Date  . Anemia     thrombocytopenia  . History of transfusion of whole blood   . Cirrhosis of liver     NASH, vaccinated for HepA/B on 05/31/11 afp 1.6, thrombus in splenic vein per ct  . Coronary artery disease 2004    s/p CAGB  . Hyperlipidemia   . Hypothyroidism   . GERD (gastroesophageal reflux disease)   . Atherosclerosis   . Overactive bladder   . Erectile dysfunction   . Gynecomastia, male     RIGHT  . Thrombocytopenia   . DM (diabetes mellitus)   . Esophageal varices      EGD 05/13/10, three columns grade II to III EV s/p banding, second banding since 01/2010.>Portal gastropathy.. >5 banding sessions in the past.  . Spleen enlarged   . Thrombus     Chronic splenic, portal and smv    Past Surgical History  Procedure Date  . Esophagogastroduodenoscopy 01/2010    4 COLUMNS 2-3 GRADE ESOPHAGEAL VARICES,S/P BANDING,PORTAL GASTROPATHY  . Coronary artery bypass graft 2004  . Cardiac catheterization 10/09/06  . Colonoscopy 01/2010    portal colopathy, sigmoid AVM (nonbleeding)  . Esophagogastroduodenoscopy June 2012    Dr. Jaiyah Beining-->3 columns of grade 1 esophageal varices with overlying scar, no banding treatment required, portal gastropathy. Next EGD in December 2013    Prior to Admission medications   Medication Sig Start Date End Date Taking? Authorizing Provider  atorvastatin (LIPITOR)  10 MG tablet Take 10 mg by mouth daily. 1/2 by mouth once daily    Yes Historical Provider, MD  FREESTYLE LITE test strip 1 strip daily. 05/20/10  Yes Historical Provider, MD  furosemide (LASIX) 20 MG tablet Take 20 mg by mouth 2 (two) times daily.  08/08/11 08/07/12 Yes Joselyn Arrow, NP  isosorbide mononitrate (IMDUR) 30 MG 24 hr tablet Take 30 mg by mouth daily.     Yes Historical Provider, MD  levothyroxine (SYNTHROID, LEVOTHROID) 150 MCG tablet Take 150 mcg by mouth daily.  09/09/11  Yes Historical Provider, MD  lisinopril (PRINIVIL,ZESTRIL) 2.5 MG tablet Take 2.5 mg by mouth daily.     Yes Historical Provider, MD  nadolol (CORGARD) 20 MG tablet Take 20 mg by mouth daily.     Yes Historical Provider, MD  sitaGLIPtan (JANUVIA) 100 MG tablet Take 100 mg by mouth daily. Takes tablet in the AM   Yes Historical Provider, MD  spironolactone (ALDACTONE) 25 MG tablet Take 50 mg by mouth 2 (two) times daily.    Yes Historical Provider, MD    Allergies as of 10/11/2011  . (No Known Allergies)    Family History  Problem Relation Age of Onset  . Stroke Father 45  . Heart disease Mother 7    CHF  . Heart attack Brother   . Liver disease Neg Hx   . Colon cancer Neg Hx     History   Social History  .  Marital Status: Married    Spouse Name: N/A    Number of Children: 4  . Years of Education: N/A   Occupational History  .     Social History Main Topics  . Smoking status: Never Smoker   . Smokeless tobacco: Never Used  . Alcohol Use: 0.0 oz/week     occasionally  . Drug Use: No  . Sexually Active: Not Currently   Other Topics Concern  . Not on file   Social History Narrative  . No narrative on file    Review of Systems: See HPI, otherwise negative ROS  Physical Exam: BP 120/61  Pulse 58  Temp 98.3 F (36.8 C) (Temporal)  Ht 5\' 7"  (1.702 m)  Wt 199 lb 3.2 oz (90.357 kg)  BMI 31.20 kg/m2 General:   Alert,  Well-developed, well-nourished, pleasant and cooperative in NAD.  Accompanied by wife  Skin:  Intact without significant lesions or rashes. Eyes:  Sclera clear, no icterus.   Conjunctiva pink. Ears:  Normal auditory acuity. Nose:  No deformity, discharge,  or lesions. Mouth:  No deformity or lesions. Neck:  Supple; no masses or thyromegaly. No significant cervical adenopathy. Lungs:  Clear throughout to auscultation.   No wheezes, crackles, or rhonchi. No acute distress. Heart:  Regular rate and rhythm; no murmurs, clicks, rubs,  or gallops. Abdomen: Distended. Mild diffuse tenderness. Positive fluid wave and shifting dullness. Spleen palpable. He has 1-2+ lower extremity edema down to the ankles. Pulses:  Normal pulses noted. Extremities:  Without clubbing or edema.  Impression/Plan:   Pleasant 66 year old gentleman decompensated Nash/cirrhosis now with anasarca refractory to escalating doses of diuretic therapy. Renal function normal. History splenic vein thrombosis. He needs another CT scan to evaluate for progression of clot, ascites, etc. I discussed with Dr. Vinnie Level.   He agrees with CT. Prior platelet count in the 50,000 range also, INR elevated somewhat at 1.70. We'll alert the radiologist on duty tomorrow (repeat INR and CBC for tomorrow).  No change in diuretic regimen yet. Further recommendations to follow.

## 2011-10-12 ENCOUNTER — Other Ambulatory Visit: Payer: Self-pay

## 2011-10-12 ENCOUNTER — Ambulatory Visit (HOSPITAL_COMMUNITY)
Admission: RE | Admit: 2011-10-12 | Discharge: 2011-10-12 | Disposition: A | Discharge status: Home / Self Care | Payer: Medicare Other | Source: Ambulatory Visit | Attending: Internal Medicine | Admitting: Internal Medicine

## 2011-10-12 ENCOUNTER — Other Ambulatory Visit: Payer: Self-pay | Admitting: Internal Medicine

## 2011-10-12 ENCOUNTER — Inpatient Hospital Stay (HOSPITAL_COMMUNITY): Admission: RE | Admit: 2011-10-12 | Discharge: 2011-10-12 | Payer: Medicare Other | Source: Ambulatory Visit

## 2011-10-12 ENCOUNTER — Encounter: Payer: Self-pay | Admitting: Internal Medicine

## 2011-10-12 DIAGNOSIS — K7460 Unspecified cirrhosis of liver: Secondary | ICD-10-CM

## 2011-10-12 DIAGNOSIS — I82890 Acute embolism and thrombosis of other specified veins: Secondary | ICD-10-CM

## 2011-10-12 DIAGNOSIS — D7389 Other diseases of spleen: Secondary | ICD-10-CM | POA: Insufficient documentation

## 2011-10-12 DIAGNOSIS — R188 Other ascites: Secondary | ICD-10-CM | POA: Insufficient documentation

## 2011-10-12 LAB — BODY FLUID CELL COUNT WITH DIFFERENTIAL: Eos, Fluid: 0 %

## 2011-10-12 LAB — PROTIME-INR: Prothrombin Time: 21.3 seconds — ABNORMAL HIGH (ref 11.6–15.2)

## 2011-10-12 MED ORDER — IOHEXOL 300 MG/ML  SOLN
100.0000 mL | Freq: Once | INTRAMUSCULAR | Status: AC | PRN
  Administered 2011-10-12: 100 mL via INTRAVENOUS

## 2011-10-12 NOTE — Progress Notes (Signed)
Opened in error

## 2011-10-12 NOTE — Progress Notes (Signed)
Tried to call pt- LMOM 

## 2011-10-12 NOTE — Progress Notes (Signed)
Patient ID: [name]., male   DOB: 11/12/1945, 66 y.o.   MRN: [id number]  CT reviewed. Recommend patient continue on Aldactone 25 mg orally twice a day. I also recommend Lasix dosing be changed to 40 mg each morning and 20 mg in the evening. Patient should see the dietitian, all along with his wife, to be versed in a 2 g sodium diet.  Office visit with me in about 2 weeks metabolic 7 just prior to office visit. Please let him know.

## 2011-10-12 NOTE — Progress Notes (Signed)
Pt aware, lab order in mail to pt.  [name]- please refer to dietitian Darl Pikes- please schedule ov with RMR- 2 weeks.

## 2011-10-12 NOTE — Progress Notes (Signed)
Lidocaine 1%              3mL injected                                                  abdominal fluid removed

## 2011-10-12 NOTE — Progress Notes (Signed)
Referral has been faxed over to [facility] at [facility]

## 2011-10-13 ENCOUNTER — Telehealth (HOSPITAL_COMMUNITY): Payer: Self-pay | Admitting: Dietician

## 2011-10-13 NOTE — Telephone Encounter (Signed)
Received referral via fax from [facility] (Dr. Jena Gauss) for dx: ascites/ low sodium diet instruction.

## 2011-10-14 ENCOUNTER — Encounter: Payer: Self-pay | Admitting: Internal Medicine

## 2011-10-14 NOTE — Progress Notes (Signed)
Pt is aware of OV on 8/27 @ 0800 with RMR and appt card was mailed

## 2011-10-14 NOTE — Telephone Encounter (Signed)
Pt requests no Monday or Tuesday appointments. Prefers afternoon appointment. Offered 10/26/11 appointment (earliest PM appointment) and pt reported that due to his work schedule he is unable to schedule appointments that far in advance. He requests calling back later next week, when he will have a better idea about his availability for that week.

## 2011-10-16 LAB — BODY FLUID CULTURE: Culture: NO GROWTH

## 2011-10-17 LAB — ANAEROBIC CULTURE: Gram Stain: NONE SEEN

## 2011-10-21 LAB — BASIC METABOLIC PANEL
BUN: 17 mg/dL (ref 6–23)
Calcium: 8.1 mg/dL — ABNORMAL LOW (ref 8.4–10.5)
Creat: 0.83 mg/dL (ref 0.50–1.35)
Glucose, Bld: 84 mg/dL (ref 70–99)

## 2011-10-21 NOTE — Telephone Encounter (Signed)
Sent letter to pt home via [employer] Mail in attempt to contact pt to schedule appointment.  

## 2011-10-25 ENCOUNTER — Other Ambulatory Visit: Payer: Self-pay

## 2011-10-25 MED ORDER — FUROSEMIDE 20 MG PO TABS: ORAL_TABLET | ORAL | Status: DC

## 2011-10-25 NOTE — Telephone Encounter (Signed)
On 10/12/11 your increased his Lasix to 40 mg in the morning and 20 mg at night. Pt needs refills. How many do you want to give him.

## 2011-10-26 ENCOUNTER — Other Ambulatory Visit: Payer: Self-pay

## 2011-10-26 ENCOUNTER — Other Ambulatory Visit: Payer: Self-pay | Admitting: Internal Medicine

## 2011-10-26 DIAGNOSIS — K7460 Unspecified cirrhosis of liver: Secondary | ICD-10-CM

## 2011-10-27 NOTE — Telephone Encounter (Signed)
Sent letter to pt home via [employer] Mail in attempt to contact pt to schedule appointment.  

## 2011-11-01 ENCOUNTER — Telehealth (HOSPITAL_COMMUNITY): Payer: Self-pay | Admitting: Dietician

## 2011-11-01 ENCOUNTER — Ambulatory Visit (INDEPENDENT_AMBULATORY_CARE_PROVIDER_SITE_OTHER): Payer: Medicare Other | Admitting: Internal Medicine

## 2011-11-01 ENCOUNTER — Ambulatory Visit: Payer: Medicare Other | Admitting: Internal Medicine

## 2011-11-01 ENCOUNTER — Encounter: Payer: Self-pay | Admitting: Internal Medicine

## 2011-11-01 ENCOUNTER — Other Ambulatory Visit: Payer: Self-pay

## 2011-11-01 VITALS — BP 102/56 | HR 63 | Temp 98.1°F | Ht 67.0 in | Wt 181.4 lb

## 2011-11-01 DIAGNOSIS — K7460 Unspecified cirrhosis of liver: Secondary | ICD-10-CM

## 2011-11-01 DIAGNOSIS — R188 Other ascites: Secondary | ICD-10-CM

## 2011-11-01 NOTE — Telephone Encounter (Signed)
Received another referral from [facility] (Dr. Jena Gauss) for dx: ascites, cirrhosis, and 2 gram sodium diet. Noted referral received on 10/13/11. Pt has been contacted 3 times since previous referral has been sent and has not responded to efforts to contact office for an appointment. See previous telephone encounter for details. Will keep referral on file.

## 2011-11-01 NOTE — Patient Instructions (Addendum)
Stop zesteril/lisinipril - blood pressure too low and medication may have something to do with your cough  See. Dr. Timothy Lasso hall this week  Continue all other medications  See dietitian  CBC, BMET in 3 weeks just before ov with  me in 3 -4 weeks

## 2011-11-01 NOTE — Progress Notes (Signed)
Primary Care Physician:  Dwana Melena, MD Primary Gastroenterologist:  Dr. Jena Gauss  Pre-Procedure History & Physical: HPI:  [name]. is a 66 y.o. male here for followup Elita Boone cirrhosis ascites status post 3.5 L LVP. No evidence of infection or tumor. Weight down 17 pounds. Has worsening of insidious cough. Weakness no energy. No melena rectal bleeding is not dizzy when he stands up blood pressure noted the level just over 100 systolic-see below. Hasn't seen dietitian yet. Hasn't seen Dr. Dannette Barbara recently. Cough is not productive. No fever chills. CT recently demonstrated chronic splenic vein, portal vein SM date  Past Medical History  Diagnosis Date  . Anemia     thrombocytopenia  . History of transfusion of whole blood   . Cirrhosis of liver     NASH, vaccinated for HepA/B on 05/31/11 afp 1.6, thrombus in splenic vein per ct  . Coronary artery disease 2004    s/p CAGB  . Hyperlipidemia   . Hypothyroidism   . GERD (gastroesophageal reflux disease)   . Atherosclerosis   . Overactive bladder   . Erectile dysfunction   . Gynecomastia, male     RIGHT  . Thrombocytopenia   . DM (diabetes mellitus)   . Esophageal varices      EGD 05/13/10, three columns grade II to III EV s/p banding, second banding since 01/2010.>Portal gastropathy.. >5 banding sessions in the past.  . Spleen enlarged   . Thrombus     Chronic splenic, portal and smv    Past Surgical History  Procedure Date  . Esophagogastroduodenoscopy 01/2010    4 COLUMNS 2-3 GRADE ESOPHAGEAL VARICES,S/P BANDING,PORTAL GASTROPATHY  . Coronary artery bypass graft 2004  . Cardiac catheterization 10/09/06  . Colonoscopy 01/2010    portal colopathy, sigmoid AVM (nonbleeding)  . Esophagogastroduodenoscopy June 2012    Dr. Rourk-->3 columns of grade 1 esophageal varices with overlying scar, no banding treatment required, portal gastropathy. Next EGD in December 2013    Prior to Admission medications   Medication Sig Start Date  End Date Taking? Authorizing Provider  atorvastatin (LIPITOR) 10 MG tablet Take 10 mg by mouth daily. 1/2 by mouth once daily    Yes Historical Provider, MD  FREESTYLE LITE test strip 1 strip daily. 05/20/10  Yes Historical Provider, MD  furosemide (LASIX) 20 MG tablet Take 40 mg in the am then 20 mg in the pm 10/25/11  Yes Corbin Ade, MD  isosorbide mononitrate (IMDUR) 30 MG 24 hr tablet Take 30 mg by mouth daily.     Yes Historical Provider, MD  levothyroxine (SYNTHROID, LEVOTHROID) 150 MCG tablet Take 150 mcg by mouth daily.  09/09/11  Yes Historical Provider, MD  lisinopril (PRINIVIL,ZESTRIL) 2.5 MG tablet Take 2.5 mg by mouth daily.     Yes Historical Provider, MD  nadolol (CORGARD) 20 MG tablet Take 20 mg by mouth daily.     Yes Historical Provider, MD  sitaGLIPtan (JANUVIA) 100 MG tablet Take 100 mg by mouth daily. Takes tablet in the AM   Yes Historical Provider, MD  spironolactone (ALDACTONE) 25 MG tablet Take 50 mg by mouth 2 (two) times daily.    Yes Historical Provider, MD    Allergies as of 11/01/2011  . (No Known Allergies)    Family History  Problem Relation Age of Onset  . Stroke Father 73  . Heart disease Mother 51    CHF  . Heart attack Brother   . Liver disease Neg Hx   . Colon  cancer Neg Hx     History   Social History  . Marital Status: Married    Spouse Name: N/A    Number of Children: 4  . Years of Education: N/A   Occupational History  .     Social History Main Topics  . Smoking status: Never Smoker   . Smokeless tobacco: Never Used  . Alcohol Use: 0.0 oz/week     occasionally  . Drug Use: No  . Sexually Active: Not Currently   Other Topics Concern  . Not on file   Social History Narrative  . No narrative on file    Review of Systems: See HPI, otherwise negative ROS  Physical Exam: BP 102/56  Pulse 63  Temp 98.1 F (36.7 C) (Temporal)  Ht 5\' 7"  (1.702 m)  Wt 181 lb 6.4 oz (82.283 kg)  BMI 28.41 kg/m2 General:  Somewhat gaunt  appearing multiple tattoos Well-developed, well-nourished, pleasant and cooperative in NAD Skin:  Intact without significant lesions or rashes. Eyes:  Sclera clear, no icterus.   Conjunctiva pink. Ears:  Normal auditory acuity. Nose:  No deformity, discharge,  or lesions. Mouth:  No deformity or lesions. Neck:  Supple; no masses or thyromegaly. No significant cervical adenopathy. Lungs:  Clear throughout to auscultation.   No wheezes, crackles, or rhonchi. No acute distress. Heart:  Regular rate and rhythm; no murmurs, clicks, rubs,  or gallops. Abdomen: Full. Positive bowel sounds. He does not have tense ascites spleen is palpable. Equivocal fluid wave and shifting dullness. Not as tense as when he was seen previously.  Extremities:  Without clubbing or edema.  Impression/Plan:  Recently decompensated Nash cirrhosis. Status post LvP.   Diuretic regimen looks good at this time. Chronic cough maybe secondary to lisinopril. Blood pressure running a little low.  However, multiple other etiologies for chronic cough.  I recommend, for now, he stop lisinopril. See Dr. Margo Aye later this week to reassess and to further evaluate cough. See dietitian. Continue current regimen of Lasix and Aldactone. If he gains 3 pounds with serial weights at home between now and the next office visit he is to let us know. Labs just before next office visit in 3-4 weeks.

## 2011-11-01 NOTE — Telephone Encounter (Signed)
Pt has not responded to attempts to contact to schedule appointment. Referral filed.  

## 2011-11-03 ENCOUNTER — Telehealth (HOSPITAL_COMMUNITY): Payer: Self-pay | Admitting: Dietician

## 2011-11-03 NOTE — Telephone Encounter (Signed)
Mailed appointment confirmation letter and instructions for appointment scheduled 11/10/11 at 3:30 PM via [place] Mail.

## 2011-11-03 NOTE — Telephone Encounter (Signed)
Appointment scheduled for 11/10/11 at 3:30 PM.

## 2011-11-10 ENCOUNTER — Encounter (HOSPITAL_COMMUNITY): Payer: Self-pay | Admitting: Dietician

## 2011-11-10 NOTE — Progress Notes (Signed)
Outpatient Initial Nutrition Assessment  Date:11/10/2011   Appt Start Time: 1525  Referring Physician: Dr. Jena Gauss Reason for Visit: ascites, cirrhosis, education on 2 gram NA diet  Nutrition Assessment:  Height: 5\' 7"  (170.2 cm)   Weight: 185 lb (83.915 kg)   IBW: 148# %IBW: 125% UBW: 190# %UBW: 103% Body mass index is 28.97 kg/(m^2).  Goal Weight: Maintainence Weight hx: Pt reports his highest weight as 200#. His lowest weight was 168#. He reports his weight is not consistent, fluctuating greatly due to fluid. He reports he lost 19# in 1 week.   Estimated nutritional needs: 2102-2522 kcals daily, 85-101 grams protein daily, 2.2-2.5 L fluid daily  PMH:  Past Medical History  Diagnosis Date  . Anemia     thrombocytopenia  . History of transfusion of whole blood   . Cirrhosis of liver     NASH, vaccinated for HepA/B on 05/31/11 afp 1.6, thrombus in splenic vein per ct  . Coronary artery disease 2004    s/p CAGB  . Hyperlipidemia   . Hypothyroidism   . GERD (gastroesophageal reflux disease)   . Atherosclerosis   . Overactive bladder   . Erectile dysfunction   . Gynecomastia, male     RIGHT  . Thrombocytopenia   . DM (diabetes mellitus)   . Esophageal varices      EGD 05/13/10, three columns grade II to III EV s/p banding, second banding since 01/2010.>Portal gastropathy.. >5 banding sessions in the past.  . Spleen enlarged   . Thrombus     Chronic splenic, portal and smv    Medications:  Current Outpatient Rx  Name Route Sig Dispense Refill  . ATORVASTATIN CALCIUM 10 MG PO TABS Oral Take 10 mg by mouth daily. 1/2 by mouth once daily     . FREESTYLE LITE TEST VI STRP  1 strip daily.    . FUROSEMIDE 20 MG PO TABS  Take 40 mg in the am then 20 mg in the pm 60 tablet 3  . ISOSORBIDE MONONITRATE ER 30 MG PO TB24 Oral Take 30 mg by mouth daily.      Marland Kitchen LEVOTHYROXINE SODIUM 150 MCG PO TABS Oral Take 150 mcg by mouth daily.     Marland Kitchen LISINOPRIL 2.5 MG PO TABS Oral Take 2.5 mg by mouth  daily.      Marland Kitchen NADOLOL 20 MG PO TABS Oral Take 20 mg by mouth daily.      Marland Kitchen SITAGLIPTIN PHOSPHATE 100 MG PO TABS Oral Take 100 mg by mouth daily. Takes tablet in the AM    . SPIRONOLACTONE 25 MG PO TABS Oral Take 50 mg by mouth 2 (two) times daily.       Labs: CMP     Component Value Date/Time   NA 142 10/21/2011 0835   NA 143 09/06/2011 1513   K 3.6 10/21/2011 0835   K 4.3 09/06/2011 1513   CL 107 10/21/2011 0835   CL 110 09/06/2011 1513   CO2 30 10/21/2011 0835   CO2 27 09/06/2011 1513   GLUCOSE 84 10/21/2011 0835   BUN 17 10/21/2011 0835   BUN 21 09/06/2011 1513   CREATININE 0.83 10/21/2011 0835   CREATININE 0.85 04/11/2010 1350   CALCIUM 8.1* 10/21/2011 0835   CALCIUM 8.5 09/06/2011 1513   PROT 6.7 09/06/2011 1513   PROT 6.4 08/08/2011 0815   ALBUMIN 2.8* 08/08/2011 0815   AST 29 09/06/2011 1513   AST 35 08/08/2011 0815   ALT 11 09/06/2011 1513  ALKPHOS 98 09/06/2011 1513   ALKPHOS 88 08/08/2011 0815   BILITOT 2.0 09/06/2011 1513   BILITOT 1.8* 08/08/2011 0815   GFRNONAA >60 04/11/2010 1350   GFRAA  Value: >60        The eGFR has been calculated using the MDRD equation. This calculation has not been validated in all clinical situations. eGFR's persistently <60 mL/min signify possible Chronic Kidney Disease. 04/11/2010 1350    Lipid Panel     Component Value Date/Time   CHOL 1 09/06/2011 1513   TRIG 75 08/06/2008 2117   HDL 48 08/06/2008 2117   CHOLHDL 2.3 Ratio 08/06/2008 2117   VLDL 15 08/06/2008 2117   LDLCALC 47 08/06/2008 2117     Lab Results  Component Value Date   HGBA1C 5.8 09/06/2011   HGBA1C 6.4* 08/03/2010   HGBA1C 6.9 11/07/2008   Lab Results  Component Value Date   MICROALBUR 1.11 02/07/2008   LDLCALC 47 08/06/2008   CREATININE 0.83 10/21/2011     Lifestyle/ social habits: Mr. Holzer lives in [place] with his wife, who is present with him today. 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 He reports that he has no stress. He does not participate in physical activity outside of  work.  Nutrition hx/habits: Pt reports that he has not made any recent changes in his diet. He reports "good" CBGs (in the 80's). He reports he has not drank soft drinks or used salt on his food "for years". He reports a very poor appetite, due to ascites. He reports that they removed 3.85 L of fluid off him about 2 weeks ago and his appetite was the worst around that period of time prior to the paracentesis. He is also concerned about weight loss; he reports that he feels like he is losing muscle mass in his shoulders and is concerned about his protein intake. He reports frustration with dietary restrictions. He has limited his diet to very few foods because he feels like he cannot eat any food that appeals to him due to his multiple medical issues. He also reports skipping meals, usually eating 1-2 meals per day.   Diet recall: Breakfast (4-6 AM): egg sandwich (egg with toast) OR egg, toast, with [facility], OR Special K with 2% milk, water or coffee (black); Lunch (11:30-2 PM): boiled egg and piece of string cheese OR sandwich with tomato and mayonnaise, fruit bar; Dinner: cereal OR chicken and steamed potatoes  Nutrition Diagnosis: Inadequate oral food intake r/t nutrition-related knowledge deficit, limited food acceptance AEB pt skipping meals.    Nutrition Intervention: Nutrition rx: 2000 kcal heart healthy, diabetic diet; limit 1 starch per meal; low calorie beverages only  Education/Counseling Provided: Educated pt on principles of diabetic, low sodium diet. Educated pt on plate method, portion sizes, and sources of carbohydrate. Discussed importance of regular meal pattern. Discussed importance of adding sources of whole grains to diet to improve glycemic control. Also encouraged to choose low fat dairy, lean meats, and whole fruits and vegetables more often. Reviewed food label reading. Discussed nutritional content of foods commonly eaten and discussed healthier alternatives. Discussed healthy  food preparation methods and ways to reduce sodium in diet, including limiting salt during preparation and at the table and alternatives to seasoning foods. Discussed how to choose healthier entrees at restaurants. Provided AND Nutrition Care Manual handout(Cirrhosis Nutrition Therapy). Also provided plate method and "[employer]ination Heart Healthy Eating" handout.   Understanding, Motivation, Ability to Follow Recommendations: Expect fair compliance.  Monitoring and Evaluation: Goals: 1) Weight maintenance; 2) 2000 mg sodium daily  Recommendations: 1) Limit high sodium foods (spaghetti sauce, chicken wings) to 1-2 times per month; 2) 4-6 small meals per day to increase caloric intake; 3) Consume small portions of high calorie, high carbohydrate, high sodium foods; choose healthier alternatives more often  F/U: PRN. Provided RD contact information.   Melody Haver, RD, LDN 11/10/2011  Appt EndTime: 1650

## 2011-11-11 ENCOUNTER — Ambulatory Visit: Payer: Medicare Other | Admitting: Internal Medicine

## 2011-11-14 ENCOUNTER — Telehealth: Payer: Self-pay | Admitting: Internal Medicine

## 2011-11-14 NOTE — Telephone Encounter (Signed)
Sept recall list patient is to have a hepatic ultrasound for September

## 2011-11-14 NOTE — Telephone Encounter (Signed)
Sept recall list

## 2011-11-23 LAB — CBC WITH DIFFERENTIAL/PLATELET
Eosinophils Absolute: 0.2 10*3/uL (ref 0.0–0.7)
HCT: 29.1 % — ABNORMAL LOW (ref 39.0–52.0)
Hemoglobin: 10.2 g/dL — ABNORMAL LOW (ref 13.0–17.0)
Lymphs Abs: 1 10*3/uL (ref 0.7–4.0)
MCH: 34.1 pg — ABNORMAL HIGH (ref 26.0–34.0)
Monocytes Absolute: 0.5 10*3/uL (ref 0.1–1.0)
Monocytes Relative: 12 % (ref 3–12)
Neutro Abs: 2.5 10*3/uL (ref 1.7–7.7)
Neutrophils Relative %: 60 % (ref 43–77)
RBC: 2.99 MIL/uL — ABNORMAL LOW (ref 4.22–5.81)

## 2011-11-24 ENCOUNTER — Other Ambulatory Visit: Payer: Self-pay

## 2011-11-24 LAB — AFB CULTURE WITH SMEAR (NOT AT ARMC)

## 2011-11-24 LAB — BASIC METABOLIC PANEL
Chloride: 104 mEq/L (ref 96–112)
Creat: 0.82 mg/dL (ref 0.50–1.35)
Sodium: 138 mEq/L (ref 135–145)

## 2011-11-24 MED ORDER — SPIRONOLACTONE 25 MG PO TABS: 50.0000 mg | ORAL_TABLET | Freq: Two times a day (BID) | ORAL | Status: DC

## 2011-11-29 ENCOUNTER — Encounter: Payer: Self-pay | Admitting: Internal Medicine

## 2011-11-29 ENCOUNTER — Ambulatory Visit (INDEPENDENT_AMBULATORY_CARE_PROVIDER_SITE_OTHER): Payer: Medicare Other | Admitting: Internal Medicine

## 2011-11-29 VITALS — BP 113/68 | HR 63 | Temp 98.5°F | Ht 67.0 in | Wt 181.4 lb

## 2011-11-29 DIAGNOSIS — K7460 Unspecified cirrhosis of liver: Secondary | ICD-10-CM

## 2011-11-29 DIAGNOSIS — R188 Other ascites: Secondary | ICD-10-CM

## 2011-11-29 NOTE — Progress Notes (Signed)
Primary Care Physician:  Dwana Melena, MD Primary Gastroenterologist:  Dr. Jena Gauss  Pre-Procedure History & Physical: HPI:  [name]. is a 66 y.o. male here for  followup Elita Boone cirrhosis. First LVP recently. Negative for infection and malignancy. Has seen a dietitian. Stays on salt restricted diet. On Lasix and Aldactone. Chronic anemia and thrombocytopenia. Electrolytes are in line. Weight stable at 181 pounds. Cough better off Lisinopril. . Overall is feeling much better than he did previously. His energy level is improved.  Past Medical History  Diagnosis Date  . Anemia     thrombocytopenia  . History of transfusion of whole blood   . Cirrhosis of liver     NASH, vaccinated for HepA/B on 05/31/11 afp 1.6, thrombus in splenic vein per ct  . Coronary artery disease 2004    s/p CAGB  . Hyperlipidemia   . Hypothyroidism   . GERD (gastroesophageal reflux disease)   . Atherosclerosis   . Overactive bladder   . Erectile dysfunction   . Gynecomastia, male     RIGHT  . Thrombocytopenia   . DM (diabetes mellitus)   . Esophageal varices      EGD 05/13/10, three columns grade II to III EV s/p banding, second banding since 01/2010.>Portal gastropathy.. >5 banding sessions in the past.  . Spleen enlarged   . Thrombus     Chronic splenic, portal and smv    Past Surgical History  Procedure Date  . Esophagogastroduodenoscopy 01/2010    4 COLUMNS 2-3 GRADE ESOPHAGEAL VARICES,S/P BANDING,PORTAL GASTROPATHY  . Coronary artery bypass graft 2004  . Cardiac catheterization 10/09/06  . Colonoscopy 01/2010    portal colopathy, sigmoid AVM (nonbleeding)  . Esophagogastroduodenoscopy June 2012    Dr. Adean Milosevic-->3 columns of grade 1 esophageal varices with overlying scar, no banding treatment required, portal gastropathy. Next EGD in December 2013    Prior to Admission medications   Medication Sig Start Date End Date Taking? Authorizing Provider  atorvastatin (LIPITOR) 10 MG tablet Take 10 mg by  mouth daily. 1/2 by mouth once daily    Yes Historical Provider, MD  FREESTYLE LITE test strip 1 strip daily. 05/20/10  Yes Historical Provider, MD  furosemide (LASIX) 20 MG tablet Take 40 mg in the am then 20 mg in the pm 10/25/11  Yes Corbin Ade, MD  isosorbide mononitrate (IMDUR) 30 MG 24 hr tablet Take 30 mg by mouth daily.     Yes Historical Provider, MD  levothyroxine (SYNTHROID, LEVOTHROID) 150 MCG tablet Take 150 mcg by mouth daily.  09/09/11  Yes Historical Provider, MD  nadolol (CORGARD) 20 MG tablet Take 20 mg by mouth daily.     Yes Historical Provider, MD  sitaGLIPtan (JANUVIA) 100 MG tablet Take 100 mg by mouth daily. Takes tablet in the AM   Yes Historical Provider, MD  spironolactone (ALDACTONE) 25 MG tablet Take 2 tablets (50 mg total) by mouth 2 (two) times daily. 11/24/11  Yes Joselyn Arrow, NP  lisinopril (PRINIVIL,ZESTRIL) 2.5 MG tablet Take 2.5 mg by mouth daily.      Historical Provider, MD    Allergies as of 11/29/2011  . (No Known Allergies)    Family History  Problem Relation Age of Onset  . Stroke Father 51  . Heart disease Mother 16    CHF  . Heart attack Brother   . Liver disease Neg Hx   . Colon cancer Neg Hx     History   Social History  . Marital  Status: Married    Spouse Name: N/A    Number of Children: 4  . Years of Education: N/A   Occupational History  .     Social History Main Topics  . Smoking status: Never Smoker   . Smokeless tobacco: Never Used  . Alcohol Use: 0.0 oz/week     occasionally  . Drug Use: No  . Sexually Active: Not Currently   Other Topics Concern  . Not on file   Social History Narrative  . No narrative on file    Review of Systems: See HPI, otherwise negative ROS  Physical Exam: BP 113/68  Pulse 63  Temp 98.5 F (36.9 C) (Temporal)  Ht 5\' 7"  (1.702 m)  Wt 181 lb 6.4 oz (82.283 kg)  BMI 28.41 kg/m2 General:  Somewhat chronically ill appearing individual alert conversant well oriented. No  asterixis. Skin:  Intact without significant lesions or rashes. Multiple tattoos Eyes:  Sclera clear, no icterus.   Conjunctiva pink. Ears:  Normal auditory acuity. Nose:  No deformity, discharge,  or lesions. Mouth:  No deformity or lesions. Neck:  Supple; no masses or thyromegaly. No significant cervical adenopathy. Lungs:  Clear throughout to auscultation.   No wheezes, crackles, or rhonchi. No acute distress. Heart:  Regular rate and rhythm; no murmurs, clicks, rubs,  or gallops. Abdomen: Full. Positive bowel sounds. No tense ascites. Spleen palpable. Pulses:  Normal pulses noted. Extremities:  Without clubbing or edema.  Impression/Plan:  Recently decompensated Nash/cirrhosis doing much better now on a diuretic regimen, salt restriction and status post his first LVP. His energy level has improved. He is feeling overall much better. Cough better off lisinopril. His blood pressure, running a little low previously may have contributed to his constitutional symptoms recently.  Recommendations: Continue present medical regimen. Continue salt restriction. Labs and office visit 6 weeks.

## 2011-11-29 NOTE — Patient Instructions (Addendum)
Continue present diuretic regimen  Continue salt restriction  OV 6 weeks with BMET and CBC just before OV

## 2011-12-21 ENCOUNTER — Other Ambulatory Visit: Payer: Self-pay

## 2011-12-21 DIAGNOSIS — K7460 Unspecified cirrhosis of liver: Secondary | ICD-10-CM

## 2011-12-22 ENCOUNTER — Ambulatory Visit (HOSPITAL_COMMUNITY)
Admission: RE | Admit: 2011-12-22 | Discharge: 2011-12-22 | Disposition: A | Discharge status: Home / Self Care | Payer: Medicare Other | Source: Ambulatory Visit | Attending: Internal Medicine | Admitting: Internal Medicine

## 2011-12-22 ENCOUNTER — Other Ambulatory Visit (HOSPITAL_COMMUNITY): Payer: Self-pay | Admitting: Internal Medicine

## 2011-12-22 DIAGNOSIS — K7460 Unspecified cirrhosis of liver: Secondary | ICD-10-CM

## 2011-12-22 DIAGNOSIS — R1900 Intra-abdominal and pelvic swelling, mass and lump, unspecified site: Secondary | ICD-10-CM

## 2011-12-22 LAB — BODY FLUID CELL COUNT WITH DIFFERENTIAL
Lymphs, Fluid: 37 %
Monocyte-Macrophage-Serous Fluid: 54 % (ref 50–90)

## 2011-12-22 NOTE — Procedures (Signed)
PreOperative Dx: Cirrhosis, ascites Postoperative Dx: Cirrhosis, ascites Procedure:   US guided paracentesis Radiologist:  Tyron Russell Anesthesia:  5 ml of 1% lidocaine Specimen:  3300 ml of yellow fluid EBL:   < 1 ml Complications: None

## 2011-12-22 NOTE — Progress Notes (Signed)
Lidocaine 1%        5mL injected                       3460 mL abdominal fluid removed

## 2011-12-26 ENCOUNTER — Other Ambulatory Visit: Payer: Self-pay | Admitting: Gastroenterology

## 2011-12-26 ENCOUNTER — Other Ambulatory Visit: Payer: Self-pay

## 2011-12-26 ENCOUNTER — Telehealth: Payer: Self-pay | Admitting: Gastroenterology

## 2011-12-26 DIAGNOSIS — K7460 Unspecified cirrhosis of liver: Secondary | ICD-10-CM

## 2011-12-26 LAB — BODY FLUID CULTURE: Gram Stain: NONE SEEN

## 2011-12-26 NOTE — Telephone Encounter (Signed)
Lab order faxed to lab for pt to have done when he has his other labs done.

## 2011-12-26 NOTE — Telephone Encounter (Signed)
Looks like [facility] plans for labs in 01/2012. Please have patient get his AFP done then too.

## 2011-12-27 ENCOUNTER — Ambulatory Visit (HOSPITAL_COMMUNITY): Payer: Medicare Other | Admitting: Oncology

## 2011-12-28 ENCOUNTER — Encounter (HOSPITAL_COMMUNITY): Payer: Medicare Other | Attending: Oncology | Admitting: Oncology

## 2011-12-28 VITALS — BP 119/71 | HR 67 | Temp 97.6°F | Resp 18 | Wt 182.0 lb

## 2011-12-28 DIAGNOSIS — R161 Splenomegaly, not elsewhere classified: Secondary | ICD-10-CM

## 2011-12-28 DIAGNOSIS — D649 Anemia, unspecified: Secondary | ICD-10-CM

## 2011-12-28 DIAGNOSIS — K7200 Acute and subacute hepatic failure without coma: Secondary | ICD-10-CM

## 2011-12-28 DIAGNOSIS — K7460 Unspecified cirrhosis of liver: Secondary | ICD-10-CM

## 2011-12-28 DIAGNOSIS — D696 Thrombocytopenia, unspecified: Secondary | ICD-10-CM

## 2011-12-28 DIAGNOSIS — R188 Other ascites: Secondary | ICD-10-CM

## 2011-12-28 NOTE — Patient Instructions (Addendum)
[facility]  Discharge Instructions  RECOMMENDATIONS MADE BY THE CONSULTANT AND ANY TEST RESULTS WILL BE SENT TO YOUR REFERRING DOCTOR.   EXAM FINDINGS BY MD TODAY AND SIGNS AND SYMPTOMS TO REPORT TO CLINIC OR PRIMARY MD: Exam and discussion by MD.  Dr. Scharlene Gloss office will send [place] a copy of your blood work.  They did a CBD and CMET and we will not need any additional labs today.    Report uncontrolled pain, increased shortness of breath, etc.  MEDICATIONS PRESCRIBED: none    SPECIAL INSTRUCTIONS/FOLLOW-UP: Return to Clinic in 6 months.   I acknowledge that I have been informed and understand all the instructions given to me and received a copy. I do not have any more questions at this time, but understand that I may call the Specialty Clinic at [facility] at [phone] during business hours should I have any further questions or need assistance in obtaining follow-up care.    __________________________________________  _____________  __________ Signature of Patient or Authorized Representative            Date                   Time    __________________________________________ Nurse's Signature

## 2011-12-28 NOTE — Progress Notes (Signed)
Diagnoses #1 thrombocytopenia and anemia secondary to cirrhosis of the liver with secondary splenomegaly, now with ascites. He is feeling slightly weaker than last I saw him but his counts are stable in September. He had blood work the other day and once again his platelets and hemoglobin are stable as is his white count. His abdomen is distended with ascites today. He had a paracentesis recently yielding 3300 cc.  We will see him back in several more months since he is stable from our standpoint.

## 2011-12-30 LAB — BASIC METABOLIC PANEL
CO2: 30 mEq/L (ref 19–32)
Glucose, Bld: 157 mg/dL — ABNORMAL HIGH (ref 70–99)
Potassium: 4.1 mEq/L (ref 3.5–5.3)
Sodium: 137 mEq/L (ref 135–145)

## 2011-12-30 LAB — CBC WITH DIFFERENTIAL/PLATELET
Basophils Absolute: 0 10*3/uL (ref 0.0–0.1)
Basophils Relative: 1 % (ref 0–1)
Eosinophils Absolute: 0.2 10*3/uL (ref 0.0–0.7)
Eosinophils Relative: 3 % (ref 0–5)
HCT: 31.8 % — ABNORMAL LOW (ref 39.0–52.0)
MCHC: 35.2 g/dL (ref 30.0–36.0)
MCV: 97.5 fL (ref 78.0–100.0)
Monocytes Absolute: 0.4 10*3/uL (ref 0.1–1.0)
RDW: 14.8 % (ref 11.5–15.5)

## 2012-01-03 ENCOUNTER — Ambulatory Visit (INDEPENDENT_AMBULATORY_CARE_PROVIDER_SITE_OTHER): Payer: Medicare Other | Admitting: Internal Medicine

## 2012-01-03 ENCOUNTER — Ambulatory Visit (HOSPITAL_COMMUNITY)
Admission: RE | Admit: 2012-01-03 | Discharge: 2012-01-03 | Disposition: A | Discharge status: Home / Self Care | Payer: Medicare Other | Source: Ambulatory Visit | Attending: Internal Medicine | Admitting: Internal Medicine

## 2012-01-03 ENCOUNTER — Encounter: Payer: Self-pay | Admitting: Internal Medicine

## 2012-01-03 VITALS — BP 118/63 | HR 72 | Temp 98.2°F | Ht 67.0 in | Wt 176.4 lb

## 2012-01-03 DIAGNOSIS — R188 Other ascites: Secondary | ICD-10-CM

## 2012-01-03 DIAGNOSIS — K7460 Unspecified cirrhosis of liver: Secondary | ICD-10-CM

## 2012-01-03 NOTE — Patient Instructions (Addendum)
Chest x-ray for cough and shortness of breath  Lactulose 1 tablespoon at bedtime  Continue salt restriction  Office visit in 4 weeks

## 2012-01-03 NOTE — Progress Notes (Signed)
Primary Care Physician:  Dwana Melena, MD Primary Gastroenterologist:  Dr. Jena Gauss  Pre-Procedure History & Physical: HPI:  [name]. is a 66 y.o. male here for followup. Had second paracentesis since he was last seen here-3 L. Cell count culture negative for infection. Weight is down 5 pounds since he was seen previously. He continues on Lasix and Aldactone. Stop taking lactulose all along the way. Patient wife states is intermittently confused. Has chronic cough not improved with cessation of enalapril therapy. Saw Dr. Laurie Panda earlier in the week. Dr. Tyron Russell did note a right pleural effusion on ultrasound recently. No chest x-ray since 2007. Former smoker. Recent CBC and metabolic 7 real stable anemia thrombocytopenia. Blood sugar in the 150s.  Past Medical History  Diagnosis Date  . Anemia     thrombocytopenia  . History of transfusion of whole blood   . Cirrhosis of liver     NASH, vaccinated for HepA/B on 05/31/11 afp 1.6, thrombus in splenic vein per ct  . Coronary artery disease 2004    s/p CAGB  . Hyperlipidemia   . Hypothyroidism   . GERD (gastroesophageal reflux disease)   . Atherosclerosis   . Overactive bladder   . Erectile dysfunction   . Gynecomastia, male     RIGHT  . Thrombocytopenia   . DM (diabetes mellitus)   . Esophageal varices      EGD 05/13/10, three columns grade II to III EV s/p banding, second banding since 01/2010.>Portal gastropathy.. >5 banding sessions in the past.  . Spleen enlarged   . Thrombus     Chronic splenic, portal and smv    Past Surgical History  Procedure Date  . Esophagogastroduodenoscopy 01/2010    4 COLUMNS 2-3 GRADE ESOPHAGEAL VARICES,S/P BANDING,PORTAL GASTROPATHY  . Coronary artery bypass graft 2004  . Cardiac catheterization 10/09/06  . Colonoscopy 01/2010    portal colopathy, sigmoid AVM (nonbleeding)  . Esophagogastroduodenoscopy June 2012    Dr. Illiana Losurdo-->3 columns of grade 1 esophageal varices with overlying scar, no  banding treatment required, portal gastropathy. Next EGD in December 2013    Prior to Admission medications   Medication Sig Start Date End Date Taking? Authorizing Provider  atorvastatin (LIPITOR) 10 MG tablet Take 10 mg by mouth daily. 1/2 by mouth once daily    Yes Historical Provider, MD  FREESTYLE LITE test strip 1 strip daily. 05/20/10  Yes Historical Provider, MD  furosemide (LASIX) 20 MG tablet Take 40 mg in the am then 20 mg in the pm 10/25/11  Yes Corbin Ade, MD  HYDROcodone-acetaminophen (VICODIN) 5-500 MG per tablet Take 1 tablet by mouth every 4 (four) hours as needed.  01/02/12  Yes Historical Provider, MD  insulin glargine (LANTUS) 100 UNIT/ML injection Inject 10 Units into the skin at bedtime.   Yes Historical Provider, MD  isosorbide mononitrate (IMDUR) 30 MG 24 hr tablet Take 30 mg by mouth daily.     Yes Historical Provider, MD  levothyroxine (SYNTHROID, LEVOTHROID) 150 MCG tablet Take 150 mcg by mouth daily.  09/09/11  Yes Historical Provider, MD  lisinopril (PRINIVIL,ZESTRIL) 2.5 MG tablet Take 2.5 mg by mouth daily.     Yes Historical Provider, MD  nadolol (CORGARD) 20 MG tablet Take 20 mg by mouth daily.     Yes Historical Provider, MD  sitaGLIPtan (JANUVIA) 100 MG tablet Take 100 mg by mouth daily. Takes tablet in the AM   Yes Historical Provider, MD  spironolactone (ALDACTONE) 25 MG tablet Take 2 tablets (50  mg total) by mouth 2 (two) times daily. 11/24/11  Yes Joselyn Arrow, NP    Allergies as of 01/03/2012  . (No Known Allergies)    Family History  Problem Relation Age of Onset  . Stroke Father 34  . Heart disease Mother 66    CHF  . Heart attack Brother   . Liver disease Neg Hx   . Colon cancer Neg Hx     History   Social History  . Marital Status: Married    Spouse Name: N/A    Number of Children: 4  . Years of Education: N/A   Occupational History  .     Social History Main Topics  . Smoking status: Never Smoker   . Smokeless tobacco: Never  Used  . Alcohol Use: 0.0 oz/week     occasionally  . Drug Use: No  . Sexually Active: Not Currently   Other Topics Concern  . Not on file   Social History Narrative  . No narrative on file    Review of Systems: See HPI, otherwise negative ROS  Physical Exam: BP 118/63  Pulse 72  Temp 98.2 F (36.8 C) (Temporal)  Ht 5\' 7"  (1.702 m)  Wt 176 lb 6.4 oz (80.015 kg)  BMI 27.63 kg/m2 General:   Alert,  Well-developed, well-nourished, pleasant and cooperative in NAD. A little slow to respond to questions. Well oriented. No asterixis. Accompanied by spouse. Skin:  Intact without significant lesions or rashes. Eyes:  Sclera clear, no icterus.   Conjunctiva pink. Ears:  Normal auditory acuity. Nose:  No deformity, discharge,  or lesions. Mouth:  No deformity or lesions. Neck:  Supple; no masses or thyromegaly. No significant cervical adenopathy. Lungs:  Clear throughout to auscultation.   No wheezes, crackles, or rhonchi. No acute distress. Heart:  Regular rate and rhythm; no murmurs, clicks, rubs,  or gallops. Abdomen: Mildly distended. Positive bowel sounds. Small median sternotomy ventral hernia easily reducible. Ballotable spleen. Mild fluid wave  Pulses:  Normal pulses noted. Extremities:  Without clubbing or edema.  Impression/Plan:  Recently decompensated Nash/cirrhosis. Some evidence of encephalopathy recently. Not all lactulose. Fluid status stable at this time. Chronic cough may be related to pleural effusion but other etiologies need to be considered as well.  Recommendations: Continue present regimen of Aldactone and Lasix. Resume lactulose 15 cc at bedtime. Chest x-ray. Continue salt restriction. Office visit in 4-6 weeks.

## 2012-01-06 ENCOUNTER — Encounter (HOSPITAL_COMMUNITY): Payer: Self-pay | Admitting: Emergency Medicine

## 2012-01-06 ENCOUNTER — Emergency Department (HOSPITAL_COMMUNITY): Payer: Medicare Other

## 2012-01-06 ENCOUNTER — Emergency Department (HOSPITAL_COMMUNITY)
Admission: EM | Admit: 2012-01-06 | Discharge: 2012-01-06 | Disposition: A | Discharge status: Home / Self Care | Payer: Medicare Other | Attending: Emergency Medicine | Admitting: Emergency Medicine

## 2012-01-06 DIAGNOSIS — E119 Type 2 diabetes mellitus without complications: Secondary | ICD-10-CM | POA: Insufficient documentation

## 2012-01-06 DIAGNOSIS — I2510 Atherosclerotic heart disease of native coronary artery without angina pectoris: Secondary | ICD-10-CM | POA: Insufficient documentation

## 2012-01-06 DIAGNOSIS — R05 Cough: Secondary | ICD-10-CM | POA: Insufficient documentation

## 2012-01-06 DIAGNOSIS — N318 Other neuromuscular dysfunction of bladder: Secondary | ICD-10-CM | POA: Insufficient documentation

## 2012-01-06 DIAGNOSIS — E785 Hyperlipidemia, unspecified: Secondary | ICD-10-CM | POA: Insufficient documentation

## 2012-01-06 DIAGNOSIS — D696 Thrombocytopenia, unspecified: Secondary | ICD-10-CM | POA: Insufficient documentation

## 2012-01-06 DIAGNOSIS — Z794 Long term (current) use of insulin: Secondary | ICD-10-CM | POA: Insufficient documentation

## 2012-01-06 DIAGNOSIS — I7090 Unspecified atherosclerosis: Secondary | ICD-10-CM | POA: Insufficient documentation

## 2012-01-06 DIAGNOSIS — Z79899 Other long term (current) drug therapy: Secondary | ICD-10-CM | POA: Insufficient documentation

## 2012-01-06 DIAGNOSIS — Z87448 Personal history of other diseases of urinary system: Secondary | ICD-10-CM | POA: Insufficient documentation

## 2012-01-06 DIAGNOSIS — Z862 Personal history of diseases of the blood and blood-forming organs and certain disorders involving the immune mechanism: Secondary | ICD-10-CM | POA: Insufficient documentation

## 2012-01-06 DIAGNOSIS — R0602 Shortness of breath: Secondary | ICD-10-CM | POA: Insufficient documentation

## 2012-01-06 DIAGNOSIS — R059 Cough, unspecified: Secondary | ICD-10-CM | POA: Insufficient documentation

## 2012-01-06 DIAGNOSIS — R062 Wheezing: Secondary | ICD-10-CM | POA: Insufficient documentation

## 2012-01-06 DIAGNOSIS — E039 Hypothyroidism, unspecified: Secondary | ICD-10-CM | POA: Insufficient documentation

## 2012-01-06 DIAGNOSIS — K7460 Unspecified cirrhosis of liver: Secondary | ICD-10-CM | POA: Insufficient documentation

## 2012-01-06 DIAGNOSIS — Z8639 Personal history of other endocrine, nutritional and metabolic disease: Secondary | ICD-10-CM | POA: Insufficient documentation

## 2012-01-06 LAB — BASIC METABOLIC PANEL
CO2: 25 mEq/L (ref 19–32)
Chloride: 100 mEq/L (ref 96–112)
Creatinine, Ser: 1.07 mg/dL (ref 0.50–1.35)
GFR calc Af Amer: 82 mL/min — ABNORMAL LOW (ref >90)
Sodium: 134 mEq/L — ABNORMAL LOW (ref 135–145)

## 2012-01-06 LAB — CBC WITH DIFFERENTIAL/PLATELET
Eosinophils Absolute: 0.1 10*3/uL (ref 0.0–0.7)
Hemoglobin: 10.9 g/dL — ABNORMAL LOW (ref 13.0–17.0)
Lymphs Abs: 0.6 10*3/uL — ABNORMAL LOW (ref 0.7–4.0)
MCH: 34.8 pg — ABNORMAL HIGH (ref 26.0–34.0)
Monocytes Relative: 7 % (ref 3–12)
Neutrophils Relative %: 85 % — ABNORMAL HIGH (ref 43–77)
RBC: 3.13 MIL/uL — ABNORMAL LOW (ref 4.22–5.81)

## 2012-01-06 MED ORDER — ALBUTEROL SULFATE (5 MG/ML) 0.5% IN NEBU
5.0000 mg | INHALATION_SOLUTION | Freq: Once | RESPIRATORY_TRACT | Status: AC
  Administered 2012-01-06: 5 mg via RESPIRATORY_TRACT
  Filled 2012-01-06: qty 1

## 2012-01-06 MED ORDER — ALBUTEROL SULFATE HFA 108 (90 BASE) MCG/ACT IN AERS
2.0000 | INHALATION_SPRAY | RESPIRATORY_TRACT | Status: DC | PRN
  Administered 2012-01-06: 2 via RESPIRATORY_TRACT
  Filled 2012-01-06: qty 6.7

## 2012-01-06 NOTE — ED Notes (Signed)
Pt ambulated around nursing station. No distress noted.  O2 was 92 and HR 87 while ambulating.

## 2012-01-06 NOTE — ED Notes (Signed)
Patient placed on 2L O2 via nasal canula. O2 saturation increased to 98%.

## 2012-01-06 NOTE — ED Provider Notes (Addendum)
History   This chart was scribed for Joya Gaskins, MD by Toya Smothers. The patient was seen in room APA18/APA18. Patient's care was started at 1812.  CSN: [id number]  Arrival date & time 01/06/12  4098   First MD Initiated Contact with Patient 01/06/12 1828      Chief Complaint  Patient presents with  . Shortness of Breath  . Cough   Patient is a 66 y.o. male presenting with shortness of breath and cough. The history is provided by the patient. No language interpreter was used.  Shortness of Breath  The current episode started 3 to 5 days ago. The onset was gradual. The problem occurs frequently. The problem has been unchanged. The problem is moderate. Nothing relieves the symptoms. Associated symptoms include cough, shortness of breath and wheezing. Pertinent negatives include no chest pain, no fever, no rhinorrhea and no sore throat. His past medical history does not include asthma. Urine output has been normal.  Cough This is a new problem. The current episode started more than 2 days ago. The problem occurs constantly. The problem has been gradually worsening. The cough is non-productive. There has been no fever. Associated symptoms include chills, shortness of breath and wheezing. Pertinent negatives include no chest pain, no rhinorrhea and no sore throat. He has tried nothing for the symptoms. The treatment provided no relief. He is not a smoker. His past medical history does not include asthma.    [name]. is a 66 y.o. male who typically healthy, presents to the Emergency Department complaining of 3 weeks of new, gradual onset, moderate, constant SOB, with associate wheezing and nonproductive dry cough. Pt does not recall the context of onset. He was evaluated 3 days ago by PCP, though x-ray showed no pertinent findings. Yesterday symptoms became much worse, despite treatment with OTC medications. No chest pain, fever, diarrhea, body aches, LOC, sore throat, or weakness. Pt  denies Hx of chronic lung disease, heart failure. Pertinent medical Hx includes CAD, Diabetes mellitus, and Cirrhosis of liver.   Past Medical History  Diagnosis Date  . Anemia     thrombocytopenia  . History of transfusion of whole blood   . Cirrhosis of liver     NASH, vaccinated for HepA/B on 05/31/11 afp 1.6, thrombus in splenic vein per ct  . Coronary artery disease 2004    s/p CAGB  . Hyperlipidemia   . Hypothyroidism   . GERD (gastroesophageal reflux disease)   . Atherosclerosis   . Overactive bladder   . Erectile dysfunction   . Gynecomastia, male     RIGHT  . Thrombocytopenia   . DM (diabetes mellitus)   . Esophageal varices      EGD 05/13/10, three columns grade II to III EV s/p banding, second banding since 01/2010.>Portal gastropathy.. >5 banding sessions in the past.  . Spleen enlarged   . Thrombus     Chronic splenic, portal and smv    Past Surgical History  Procedure Date  . Esophagogastroduodenoscopy 01/2010    4 COLUMNS 2-3 GRADE ESOPHAGEAL VARICES,S/P BANDING,PORTAL GASTROPATHY  . Coronary artery bypass graft 2004  . Cardiac catheterization 10/09/06  . Colonoscopy 01/2010    portal colopathy, sigmoid AVM (nonbleeding)  . Esophagogastroduodenoscopy June 2012    Dr. Rourk-->3 columns of grade 1 esophageal varices with overlying scar, no banding treatment required, portal gastropathy. Next EGD in December 2013    Family History  Problem Relation Age of Onset  . Stroke Father 43  .  Heart disease Mother 76    CHF  . Heart attack Brother   . Liver disease Neg Hx   . Colon cancer Neg Hx     History  Substance Use Topics  . Smoking status: Never Smoker   . Smokeless tobacco: Never Used  . Alcohol Use: 0.0 oz/week     occasionally   Review of Systems  Constitutional: Positive for chills. Negative for fever.  HENT: Negative for sore throat and rhinorrhea.   Respiratory: Positive for cough, shortness of breath and wheezing.   Cardiovascular: Negative  for chest pain and leg swelling.  Gastrointestinal: Negative for nausea, vomiting, abdominal pain and diarrhea.  All other systems reviewed and are negative.    Allergies  Review of patient's allergies indicates no known allergies.  Home Medications   Current Outpatient Rx  Name Route Sig Dispense Refill  . ATORVASTATIN CALCIUM 10 MG PO TABS Oral Take 10 mg by mouth daily. 1/2 by mouth once daily     . FREESTYLE LITE TEST VI STRP  1 strip daily.    . FUROSEMIDE 20 MG PO TABS  Take 40 mg in the am then 20 mg in the pm 60 tablet 3  . HYDROCODONE-ACETAMINOPHEN 5-500 MG PO TABS Oral Take 1 tablet by mouth every 4 (four) hours as needed.     . INSULIN GLARGINE 100 UNIT/ML [place] SOLN Subcutaneous Inject 10 Units into the skin at bedtime.    . ISOSORBIDE MONONITRATE ER 30 MG PO TB24 Oral Take 30 mg by mouth daily.      Marland Kitchen LEVOTHYROXINE SODIUM 150 MCG PO TABS Oral Take 150 mcg by mouth daily.     Marland Kitchen LISINOPRIL 2.5 MG PO TABS Oral Take 2.5 mg by mouth daily.      Marland Kitchen NADOLOL 20 MG PO TABS Oral Take 20 mg by mouth daily.      Marland Kitchen SITAGLIPTIN PHOSPHATE 100 MG PO TABS Oral Take 100 mg by mouth daily. Takes tablet in the AM    . SPIRONOLACTONE 25 MG PO TABS Oral Take 2 tablets (50 mg total) by mouth 2 (two) times daily. 120 tablet 2  . SULFAMETHOXAZOLE-TMP DS 800-160 MG PO TABS        BP 127/56  Pulse 88  Temp 98.2 F (36.8 C) (Oral)  Resp 25  Ht 5\' 7"  (1.702 m)  Wt 175 lb (79.379 kg)  BMI 27.41 kg/m2  SpO2 97%  Physical Exam CONSTITUTIONAL: Well developed/well nourished HEAD AND FACE: Normocephalic/atraumatic EYES: EOMI/PERRL ENMT: Mucous membranes moist NECK: supple no meningeal signs SPINE:entire spine nontender CV: S1/S2 noted, no murmurs/rubs/gallops noted LUNGS: no apparent distress, mild tachypnea, brief scattered wheezes, ABDOMEN: soft, nontender, no rebound or guarding GU:no cva tenderness NEURO: Pt is awake/alert, moves all extremitiesx4 EXTREMITIES: pulses normal, full ROM, no  LE edema noted SKIN: warm, color normal PSYCH: no abnormalities of mood noted  ED Course  [place] bedside Performed by: Joya Gaskins Authorized by: Joya Gaskins Consent: Verbal consent obtained. Patient tolerance: Patient tolerated the procedure well with no immediate complications. Comments: Limited Cardiac Bedside ultrasound performed to evaluate for pericardial effusion (h/o cirrhosis, cardiomegaly, short of breath) No pericardial effusion noted   DIAGNOSTIC STUDIES: Oxygen Saturation is 97% on room air, normal by my interpretation.    COORDINATION OF CARE: 18:32- Ordered DG Chest 2 View 1 time imaging. 18:40- Ordered ED EKG Once. 18:58- Evaluated Pt. Pt is awake, alert, and without distress. 19:06- Patient and family informed of clinical course, understand medical  decision-making process, and agree with plan.   Pt reports improvement though still appears mildly tachypneic.  He denies CP . He felt well ambulating.  He denied typical viral syndrome (fever/myalgias) symptoms so unclear cause.  I advised further workup including eval for PE/CHF/ACS but pt refused.  He reports would like albuterol mdi and if his symptoms worsen he will come back.   We discussed strict return precautions and advised to return at any time.  Patient/wife agreeable  I did perform bedside cardiac ultrasound to eval for pericardial effusion, and none noted.     Labs Reviewed  CBC WITH DIFFERENTIAL - Abnormal; Notable for the following:    RBC 3.13 (*)     Hemoglobin 10.9 (*)     HCT 30.0 (*)     MCH 34.8 (*)     MCHC 36.3 (*)     Platelets 71 (*)     Neutrophils Relative 85 (*)     Lymphocytes Relative 7 (*)     Lymphs Abs 0.6 (*)     All other components within normal limits  BASIC METABOLIC PANEL - Abnormal; Notable for the following:    Sodium 134 (*)     Potassium 3.3 (*)     Glucose, Bld 167 (*)     GFR calc non Af Amer 70 (*)     GFR calc Af Amer 82 (*)     All other components  within normal limits   Dg Chest 2 View  01/06/2012  *RADIOLOGY REPORT*  Clinical Data: Shortness of breath, cough.  CHEST - 2 VIEW  Comparison: 01/03/2012  Findings: Elevation of the right hemidiaphragm with right basilar atelectasis or infiltrate.  Small right pleural effusion.  Heart is borderline in size.  Left lung clear.  Prior CABG.  No acute bony abnormality.  IMPRESSION: Elevation of the right hemidiaphragm with right basilar atelectasis or infiltrate and small right effusion.  Borderline cardiomegaly.   Original Report Authenticated By: Charlett Nose, M.D.       MDM  Nursing notes including past medical history and social history reviewed and considered in documentation xrays reviewed and considered Labs/vital reviewed and considered Previous records reviewed and considered - h/o cirrhosis, recent CXR reviewed      Date: 01/06/2012  Rate: 87  Rhythm: normal sinus rhythm  QRS Axis: normal  Intervals: normal  ST/T Wave abnormalities: nonspecific ST changes  Conduction Disutrbances:none  Narrative Interpretation: PVCs noted  Old EKG Reviewed: unchanged    I personally performed the services described in this documentation, which was scribed in my presence. The recorded information has been reviewed and considered.      Joya Gaskins, MD 01/06/12 2119  Joya Gaskins, MD 01/06/12 2120  Joya Gaskins, MD 01/06/12 2239

## 2012-01-06 NOTE — ED Notes (Signed)
Pt c/o sob x 3 weeks but worse over last 24 hours. Dry cough x "few days". Slight accessory muscle use noted. Denies pain. Denies dizziness/cp

## 2012-02-14 ENCOUNTER — Other Ambulatory Visit: Payer: Self-pay

## 2012-02-16 MED ORDER — FUROSEMIDE 20 MG PO TABS: ORAL_TABLET | ORAL | Status: DC

## 2012-02-16 NOTE — Telephone Encounter (Signed)
done

## 2012-02-20 ENCOUNTER — Telehealth: Payer: Self-pay | Admitting: Internal Medicine

## 2012-02-20 NOTE — Telephone Encounter (Signed)
Patient called to see if he needs to have labs done prior to his OV on Friday with RMR. I told him that I would follow up with RMR's nurse and call him at his home number.

## 2012-02-20 NOTE — Telephone Encounter (Signed)
Lab order faxed to lab. Tried to call pt- LM with details on pts answering machine.

## 2012-02-20 NOTE — Telephone Encounter (Signed)
Patient is due for AFP. He should have appt this Friday as well with Dr. Jena Gauss.

## 2012-02-24 ENCOUNTER — Ambulatory Visit (INDEPENDENT_AMBULATORY_CARE_PROVIDER_SITE_OTHER): Payer: Medicare Other | Admitting: Internal Medicine

## 2012-02-24 ENCOUNTER — Encounter: Payer: Self-pay | Admitting: Internal Medicine

## 2012-02-24 VITALS — BP 132/69 | HR 86 | Temp 97.4°F | Ht 67.0 in | Wt 158.2 lb

## 2012-02-24 DIAGNOSIS — K7460 Unspecified cirrhosis of liver: Secondary | ICD-10-CM

## 2012-02-24 NOTE — Progress Notes (Signed)
Primary Care Physician:  Dwana Melena, MD Primary Gastroenterologist:  Dr. Jena Gauss  Pre-Procedure History & Physical: HPI:  [name]. is a 66 y.o. male here for followup of Elita Boone cirrhosis. Doing very well as accomplished a nice diuresis since previously being seen. Now weighs 158 pounds. Dr. Margo Aye recently increased Aldactone to 75 mg daily.  Patient to see Dr. Margo Aye next week. Patient has had some itching that he's noticed recently. No rash. No change in laundry detergent, etc.. No new meds. Patient doing very well having 2-3 semi-formed bowel movements daily on lactulose. No reported no episodes of encephalopathy. Hepatoma surveillance is ongoing. He's been vaccinated against hepatitis A and B. He's had his flu shot this year. He's had his pneumococcal vaccine. Past Medical History  Diagnosis Date  . Anemia     thrombocytopenia  . History of transfusion of whole blood   . Cirrhosis of liver     NASH, vaccinated for HepA/B on 05/31/11 afp 1.6, thrombus in splenic vein per ct  . Coronary artery disease 2004    s/p CAGB  . Hyperlipidemia   . Hypothyroidism   . GERD (gastroesophageal reflux disease)   . Atherosclerosis   . Overactive bladder   . Erectile dysfunction   . Gynecomastia, male     RIGHT  . Thrombocytopenia   . DM (diabetes mellitus)   . Esophageal varices      EGD 05/13/10, three columns grade II to III EV s/p banding, second banding since 01/2010.>Portal gastropathy.. >5 banding sessions in the past.  . Spleen enlarged   . Thrombus     Chronic splenic, portal and smv    Past Surgical History  Procedure Date  . Esophagogastroduodenoscopy 01/2010    4 COLUMNS 2-3 GRADE ESOPHAGEAL VARICES,S/P BANDING,PORTAL GASTROPATHY  . Coronary artery bypass graft 2004  . Cardiac catheterization 10/09/06  . Colonoscopy 01/2010    portal colopathy, sigmoid AVM (nonbleeding)  . Esophagogastroduodenoscopy June 2012    Dr. Rourk-->3 columns of grade 1 esophageal varices with overlying  scar, no banding treatment required, portal gastropathy. Next EGD in December 2013    Prior to Admission medications   Medication Sig Start Date End Date Taking? Authorizing Provider  atorvastatin (LIPITOR) 10 MG tablet Take 10 mg by mouth every morning.    Yes Historical Provider, MD  furosemide (LASIX) 20 MG tablet Take 40 mg in the am then 20 mg in the pm 02/16/12  Yes Tiffany Kocher, PA  HYDROcodone-acetaminophen (VICODIN) 5-500 MG per tablet Take 1 tablet by mouth every 4 (four) hours as needed. For pain 01/02/12  Yes Historical Provider, MD  insulin glargine (LANTUS) 100 UNIT/ML injection Inject 10 Units into the skin at bedtime.   Yes Historical Provider, MD  isosorbide mononitrate (IMDUR) 30 MG 24 hr tablet Take 30 mg by mouth every morning.    Yes Historical Provider, MD  levothyroxine (SYNTHROID, LEVOTHROID) 150 MCG tablet Take 150 mcg by mouth every morning.  09/09/11  Yes Historical Provider, MD  nadolol (CORGARD) 20 MG tablet Take 20 mg by mouth every morning.    Yes Historical Provider, MD  sitaGLIPtan (JANUVIA) 100 MG tablet Take 100 mg by mouth every morning. Takes tablet in the AM   Yes Historical Provider, MD  spironolactone (ALDACTONE) 25 MG tablet Take 75 mg by mouth 2 (two) times daily. 11/24/11  Yes Joselyn Arrow, NP  sulfamethoxazole-trimethoprim (BACTRIM DS) 800-160 MG per tablet Take 1 tablet by mouth 2 (two) times daily.  01/02/12  Historical Provider, MD    Allergies as of 02/24/2012  . (No Known Allergies)    Family History  Problem Relation Age of Onset  . Stroke Father 59  . Heart disease Mother 71    CHF  . Heart attack Brother   . Liver disease Neg Hx   . Colon cancer Neg Hx     History   Social History  . Marital Status: Married    Spouse Name: N/A    Number of Children: 4  . Years of Education: N/A   Occupational History  .     Social History Main Topics  . Smoking status: Never Smoker   . Smokeless tobacco: Never Used  . Alcohol Use: 0.0  oz/week     Comment: occasionally  . Drug Use: No  . Sexually Active: Not Currently   Other Topics Concern  . Not on file   Social History Narrative  . No narrative on file    Review of Systems: See HPI, otherwise negative ROS  Physical Exam: BP 132/69  Pulse 86  Temp 97.4 F (36.3 C) (Oral)  Ht 5\' 7"  (1.702 m)  Wt 158 lb 3.2 oz (71.759 kg)  BMI 24.78 kg/m2 General:   Alert,  Well-developed, well-nourished, pleasant and cooperative in NAD. Alert conversant well oriented. No asterixis. Skin:  Intact without significant lesions or rashes.  No jaundice Eyes:  Sclera clear, no icterus.   Conjunctiva pink. Ears:  Normal auditory acuity. Nose:  No deformity, discharge,  or lesions. Mouth:  No deformity or lesions. Neck:  Supple; no masses or thyromegaly. No significant cervical adenopathy. Lungs:  Clear throughout to auscultation.   No wheezes, crackles, or rhonchi. No acute distress. Heart:  Regular rate and rhythm; no murmurs, clicks, rubs,  or gallops. Abdomen: Nondistended. Positive bowel sounds. Spleen palpable. No fluid wave or shifting dullness.  Pulses:  Normal pulses noted. Extremities:  Without clubbing or edema.  Impression/Plan:  [place]ash/cirrhosis-overall doing much better. He's had a nice diuresis. Aldactone increased by 25 mg by Dr. Margo Aye as patient reports. He needs some recent electrolytes labs and hepatic profile. He is seeing Dr. Margo Aye next week. Pruritus is of uncertain etiology to me. Certainly could be drug effect or other cause. Will defer to Dr. Margo Aye. Will check and see what labs, if any, were done next week by Dr. Margo Aye.  Follow-up with Dr. Margo Aye regarding rash/itching  Office visit here in 6 weeks  BMET next week  Continue current medication regimen  Call if you gain 5 or more pounds (Patient to weigh himself at least a couple times weekly. He will call me if he gains 5 or more pounds. Continue current medical regimen).  Office visit in 6 weeks.

## 2012-02-24 NOTE — Patient Instructions (Addendum)
Follow-up with Dr. Margo Aye regarding rash/itching  Office visit here in 6 weeks  BMET next week  Continue current medication regimen  Call if you gain 5 or more pounds

## 2012-03-19 ENCOUNTER — Other Ambulatory Visit: Payer: Self-pay

## 2012-03-19 MED ORDER — LACTULOSE 10 GM/15ML PO SOLN: 20.0000 g | Freq: Three times a day (TID) | ORAL | Status: DC

## 2012-03-26 ENCOUNTER — Other Ambulatory Visit: Payer: Self-pay

## 2012-03-26 MED ORDER — SPIRONOLACTONE 25 MG PO TABS: 75.0000 mg | ORAL_TABLET | Freq: Two times a day (BID) | ORAL | Status: DC

## 2012-04-06 ENCOUNTER — Emergency Department (HOSPITAL_COMMUNITY)
Admission: EM | Admit: 2012-04-06 | Discharge: 2012-04-06 | Disposition: A | Discharge status: Home / Self Care | Payer: Medicare Other | Attending: Emergency Medicine | Admitting: Emergency Medicine

## 2012-04-06 ENCOUNTER — Encounter (HOSPITAL_COMMUNITY): Payer: Self-pay | Admitting: *Deleted

## 2012-04-06 ENCOUNTER — Emergency Department (HOSPITAL_COMMUNITY): Payer: Medicare Other

## 2012-04-06 DIAGNOSIS — E039 Hypothyroidism, unspecified: Secondary | ICD-10-CM | POA: Insufficient documentation

## 2012-04-06 DIAGNOSIS — E119 Type 2 diabetes mellitus without complications: Secondary | ICD-10-CM | POA: Insufficient documentation

## 2012-04-06 DIAGNOSIS — Z79899 Other long term (current) drug therapy: Secondary | ICD-10-CM | POA: Insufficient documentation

## 2012-04-06 DIAGNOSIS — E785 Hyperlipidemia, unspecified: Secondary | ICD-10-CM | POA: Insufficient documentation

## 2012-04-06 DIAGNOSIS — I2510 Atherosclerotic heart disease of native coronary artery without angina pectoris: Secondary | ICD-10-CM | POA: Insufficient documentation

## 2012-04-06 DIAGNOSIS — K7290 Hepatic failure, unspecified without coma: Secondary | ICD-10-CM

## 2012-04-06 DIAGNOSIS — Z862 Personal history of diseases of the blood and blood-forming organs and certain disorders involving the immune mechanism: Secondary | ICD-10-CM | POA: Insufficient documentation

## 2012-04-06 DIAGNOSIS — Z8679 Personal history of other diseases of the circulatory system: Secondary | ICD-10-CM | POA: Insufficient documentation

## 2012-04-06 DIAGNOSIS — F29 Unspecified psychosis not due to a substance or known physiological condition: Secondary | ICD-10-CM | POA: Insufficient documentation

## 2012-04-06 DIAGNOSIS — Z8739 Personal history of other diseases of the musculoskeletal system and connective tissue: Secondary | ICD-10-CM | POA: Insufficient documentation

## 2012-04-06 DIAGNOSIS — Z794 Long term (current) use of insulin: Secondary | ICD-10-CM | POA: Insufficient documentation

## 2012-04-06 DIAGNOSIS — N62 Hypertrophy of breast: Secondary | ICD-10-CM | POA: Insufficient documentation

## 2012-04-06 DIAGNOSIS — K7682 Hepatic encephalopathy: Secondary | ICD-10-CM | POA: Insufficient documentation

## 2012-04-06 DIAGNOSIS — Z8719 Personal history of other diseases of the digestive system: Secondary | ICD-10-CM | POA: Insufficient documentation

## 2012-04-06 LAB — CBC WITH DIFFERENTIAL/PLATELET
Basophils Absolute: 0 10*3/uL (ref 0.0–0.1)
Eosinophils Relative: 1 % (ref 0–5)
HCT: 32 % — ABNORMAL LOW (ref 39.0–52.0)
Lymphocytes Relative: 15 % (ref 12–46)
Lymphs Abs: 0.8 10*3/uL (ref 0.7–4.0)
MCV: 93.6 fL (ref 78.0–100.0)
Monocytes Absolute: 0.4 10*3/uL (ref 0.1–1.0)
Neutro Abs: 3.9 10*3/uL (ref 1.7–7.7)
Platelets: 63 10*3/uL — ABNORMAL LOW (ref 150–400)
RBC: 3.42 MIL/uL — ABNORMAL LOW (ref 4.22–5.81)
RDW: 14 % (ref 11.5–15.5)
WBC: 5.1 10*3/uL (ref 4.0–10.5)

## 2012-04-06 LAB — URINALYSIS, ROUTINE W REFLEX MICROSCOPIC
Glucose, UA: NEGATIVE mg/dL
Leukocytes, UA: NEGATIVE
Specific Gravity, Urine: <1.005 — ABNORMAL LOW (ref 1.005–1.030)
pH: 6.5 (ref 5.0–8.0)

## 2012-04-06 LAB — GLUCOSE, CAPILLARY: Glucose-Capillary: 231 mg/dL — ABNORMAL HIGH (ref 70–99)

## 2012-04-06 LAB — AMMONIA: Ammonia: 75 umol/L — ABNORMAL HIGH (ref 11–60)

## 2012-04-06 LAB — BASIC METABOLIC PANEL
CO2: 27 mEq/L (ref 19–32)
Chloride: 97 mEq/L (ref 96–112)
Glucose, Bld: 236 mg/dL — ABNORMAL HIGH (ref 70–99)
Sodium: 134 mEq/L — ABNORMAL LOW (ref 135–145)

## 2012-04-06 LAB — HEPATIC FUNCTION PANEL
AST: 28 U/L (ref 0–37)
Albumin: 3.1 g/dL — ABNORMAL LOW (ref 3.5–5.2)
Total Bilirubin: 1.1 mg/dL (ref 0.3–1.2)
Total Protein: 7.9 g/dL (ref 6.0–8.3)

## 2012-04-06 LAB — URINE MICROSCOPIC-ADD ON

## 2012-04-06 NOTE — ED Notes (Signed)
Pt states Dr. Margo Aye was not in the office today.

## 2012-04-06 NOTE — ED Provider Notes (Signed)
History   This chart was scribed for Ward Givens, MD by Charolett Bumpers, ED Scribe. The patient was seen in room APA04/APA04. Patient's care was started at 1146.    CSN: [id number]  Arrival date & time 04/06/12  1113   First MD Initiated Contact with Patient 04/06/12 1146      Chief Complaint  Patient presents with  . Altered Mental Status   The history is provided by the patient and the spouse. No language interpreter was used.  [name] is a 67 y.o. male who presents to the Emergency Department complaining of gradually worsening, moderate altered mental status that started 3 days ago. Wife reports the patient has trouble finding his underwear, using toothbrush, using remote, finding words. Pt states that he is aware that he is having trouble. She denies any trouble walking. She reports that he works 2-3 days straight and has trouble with cognition on his days off in the past like having trouble turning lights on. She reports he has been fine at work, however the last time he went to work was 4 days ago. He states his blood sugar was 230 this morning and below 90 is his normal. He denies any abdominal pain, nausea, vomiting, headache, cough, sore throat. He has been taking his lactulose for his cirrhosis  PCP: Dr. Dwana Melena GI: Dr. Jena Gauss   Past Medical History  Diagnosis Date  . Anemia     thrombocytopenia  . History of transfusion of whole blood   . Cirrhosis of liver     NASH, vaccinated for HepA/B on 05/31/11 afp 1.6, thrombus in splenic vein per ct  . Coronary artery disease 2004    s/p CAGB  . Hyperlipidemia   . Hypothyroidism   . GERD (gastroesophageal reflux disease)   . Atherosclerosis   . Overactive bladder   . Erectile dysfunction   . Gynecomastia, male     RIGHT  . Thrombocytopenia   . DM (diabetes mellitus)   . Esophageal varices      EGD 05/13/10, three columns grade II to III EV s/p banding, second banding since 01/2010.>Portal gastropathy.. >5  banding sessions in the past.  . Spleen enlarged   . Thrombus     Chronic splenic, portal and smv    Past Surgical History  Procedure Date  . Esophagogastroduodenoscopy 01/2010    4 COLUMNS 2-3 GRADE ESOPHAGEAL VARICES,S/P BANDING,PORTAL GASTROPATHY  . Coronary artery bypass graft 2004  . Cardiac catheterization 10/09/06  . Colonoscopy 01/2010    portal colopathy, sigmoid AVM (nonbleeding)  . Esophagogastroduodenoscopy June 2012    Dr. Rourk-->3 columns of grade 1 esophageal varices with overlying scar, no banding treatment required, portal gastropathy. Next EGD in December 2013    Family History  Problem Relation Age of Onset  . Stroke Father 32  . Heart disease Mother 67    CHF  . Heart attack Brother   . Liver disease Neg Hx   . Colon cancer Neg Hx     History  Substance Use Topics  . Smoking status: Never Smoker   . Smokeless tobacco: Never Used  . Alcohol Use: 0.0 oz/week     Comment: occasionally  Denies tobacco use 1-2 beers weekly works as a Electrical engineer the past 3 years Lives at home Lives with spouse  Review of Systems  HENT: Negative for congestion and sore throat.   Respiratory: Negative for cough and shortness of breath.   Gastrointestinal: Negative for nausea, vomiting and abdominal  pain.  Neurological: Negative for syncope and headaches.  Psychiatric/Behavioral: Positive for confusion.  All other systems reviewed and are negative.    Allergies  Review of patient's allergies indicates no known allergies.  Home Medications   Current Outpatient Rx  Name  Route  Sig  Dispense  Refill  . ATORVASTATIN CALCIUM 10 MG PO TABS   Oral   Take 10 mg by mouth every morning.          . FUROSEMIDE 20 MG PO TABS      Take 40 mg in the am then 20 mg in the pm   90 tablet   3   . HYDROCODONE-ACETAMINOPHEN 5-500 MG PO TABS   Oral   Take 1 tablet by mouth every 4 (four) hours as needed. For pain         . INSULIN GLARGINE 100 UNIT/ML [place] SOLN    Subcutaneous   Inject 10 Units into the skin at bedtime.         . ISOSORBIDE MONONITRATE ER 30 MG PO TB24   Oral   Take 30 mg by mouth every morning.          Marland Kitchen LACTULOSE 10 GM/15ML PO SOLN   Oral   Take 30 mLs (20 g total) by mouth 3 (three) times daily.   500 mL   3   . LEVOTHYROXINE SODIUM 150 MCG PO TABS   Oral   Take 150 mcg by mouth every morning.          Marland Kitchen NADOLOL 20 MG PO TABS   Oral   Take 20 mg by mouth every morning.          Marland Kitchen SITAGLIPTIN PHOSPHATE 100 MG PO TABS   Oral   Take 100 mg by mouth every morning. Takes tablet in the AM         . SPIRONOLACTONE 25 MG PO TABS   Oral   Take 3 tablets (75 mg total) by mouth 2 (two) times daily.   180 tablet   5   . SULFAMETHOXAZOLE-TMP DS 800-160 MG PO TABS   Oral   Take 1 tablet by mouth 2 (two) times daily.            Triage Vitals: BP 111/54  Pulse 83  Temp 98 F (36.7 C) (Oral)  Resp 20  Ht 5\' 6"  (1.676 m)  Wt 150 lb (68.04 kg)  BMI 24.21 kg/m2  SpO2 99%  Vital signs normal    Physical Exam  Nursing note and vitals reviewed. Constitutional: He is oriented to person, place, and time. He appears well-developed and well-nourished. No distress.  HENT:  Head: Normocephalic and atraumatic.  Right Ear: External ear normal.  Left Ear: External ear normal.  Nose: Nose normal.  Mouth/Throat: Oropharynx is clear and moist. No oropharyngeal exudate.  Eyes: Conjunctivae normal and EOM are normal. Pupils are equal, round, and reactive to light.  Neck: Normal range of motion. Neck supple. No tracheal deviation present.  Cardiovascular: Normal rate, regular rhythm and normal heart sounds.  Exam reveals no gallop and no friction rub.   No murmur heard. Pulmonary/Chest: Effort normal and breath sounds normal. No respiratory distress. He has no wheezes. He has no rhonchi. He has no rales.  Abdominal: Soft. He exhibits no distension.  Musculoskeletal: Normal range of motion. He exhibits no edema.   Neurological: He is alert and oriented to person, place, and time.       No motor weakness, no  focal neurological findings, fully oriented to day, date, and month, year, and his location  Skin: Skin is warm and dry.  Psychiatric: He has a normal mood and affect. His behavior is normal.    ED Course  Procedures (including critical care time)  DIAGNOSTIC STUDIES: Oxygen Saturation is 99% on room air, normal by my interpretation.    COORDINATION OF CARE:  12:31-Discussed planned course of treatment with the patient and wife including CT of head, chest x-ray, blood work and UA, who is agreeable at this time.   13:40 have discussed his lab results, feel MR would give more information to make sure he hasn't had a stroke and they are agreeable. Patient does have minor elevation of his ammonia however it's much lower than the only other prior one in our system which was 113 a year half ago  A 15:45PM we have discussed patient's lactulose. He states he's only taken it once a day. He states it used to give him diarrhea but now he feels like he is more constipated. Will discuss with the hospitalist to see if they feel he needs to be admitted or if he can go home and just increase his dose of lactulose.  1617 p.m. discussed with Dr. Lendell Caprice, feel since he's not having diarrhea or loose stools with his lactulose and is actually stating he's constipated he can go home and take it 3 times a day until he starts having good stool production of at least 3-5 stools a day and he can titrate his lactulose from there. We can also be seen this coming week by either his PCP Dr. Margo Aye or his gastroenterologist Dr. Jena Gauss  Results for orders placed during the hospital encounter of 04/06/12  CBC WITH DIFFERENTIAL      Component Value Range   WBC 5.1  4.0 - 10.5 K/uL   RBC 3.42 (*) 4.22 - 5.81 MIL/uL   Hemoglobin 11.5 (*) 13.0 - 17.0 g/dL   HCT 16.1 (*) 09.6 - 04.5 %   MCV 93.6  78.0 - 100.0 fL   MCH 33.6  26.0 -  34.0 pg   MCHC 35.9  30.0 - 36.0 g/dL   RDW 40.9  81.1 - 91.4 %   Platelets 63 (*) 150 - 400 K/uL   Neutrophils Relative 77  43 - 77 %   Neutro Abs 3.9  1.7 - 7.7 K/uL   Lymphocytes Relative 15  12 - 46 %   Lymphs Abs 0.8  0.7 - 4.0 K/uL   Monocytes Relative 8  3 - 12 %   Monocytes Absolute 0.4  0.1 - 1.0 K/uL   Eosinophils Relative 1  0 - 5 %   Eosinophils Absolute 0.0  0.0 - 0.7 K/uL   Basophils Relative 0  0 - 1 %   Basophils Absolute 0.0  0.0 - 0.1 K/uL  BASIC METABOLIC PANEL      Component Value Range   Sodium 134 (*) 135 - 145 mEq/L   Potassium 4.5  3.5 - 5.1 mEq/L   Chloride 97  96 - 112 mEq/L   CO2 27  19 - 32 mEq/L   Glucose, Bld 236 (*) 70 - 99 mg/dL   BUN 31 (*) 6 - 23 mg/dL   Creatinine, Ser 7.82  0.50 - 1.35 mg/dL   Calcium 9.9  8.4 - 95.6 mg/dL   GFR calc non Af Amer 68 (*) >90 mL/min   GFR calc Af Amer 79 (*) >90 mL/min  URINALYSIS, ROUTINE  W REFLEX MICROSCOPIC      Component Value Range   Color, Urine YELLOW  YELLOW   APPearance CLEAR  CLEAR   Specific Gravity, Urine <1.005 (*) 1.005 - 1.030   pH 6.5  5.0 - 8.0   Glucose, UA NEGATIVE  NEGATIVE mg/dL   Hgb urine dipstick SMALL (*) NEGATIVE   Bilirubin Urine NEGATIVE  NEGATIVE   Ketones, ur NEGATIVE  NEGATIVE mg/dL   Protein, ur NEGATIVE  NEGATIVE mg/dL   Urobilinogen, UA 0.2  0.0 - 1.0 mg/dL   Nitrite NEGATIVE  NEGATIVE   Leukocytes, UA NEGATIVE  NEGATIVE  GLUCOSE, CAPILLARY      Component Value Range   Glucose-Capillary 231 (*) 70 - 99 mg/dL  URINE MICROSCOPIC-ADD ON      Component Value Range   RBC / HPF 3-6  <3 RBC/hpf  HEPATIC FUNCTION PANEL      Component Value Range   Total Protein 7.9  6.0 - 8.3 g/dL   Albumin 3.1 (*) 3.5 - 5.2 g/dL   AST 28  0 - 37 U/L   ALT 15  0 - 53 U/L   Alkaline Phosphatase 86  39 - 117 U/L   Total Bilirubin 1.1  0.3 - 1.2 mg/dL   Bilirubin, Direct 0.3  0.0 - 0.3 mg/dL   Indirect Bilirubin 0.8  0.3 - 0.9 mg/dL  AMMONIA      Component Value Range   Ammonia 75 (*) 11  - 60 umol/L   Laboratory interpretation all normal except mildly elevated ammonia however he has had a level of 115 before, stable thrombocytopenia, stable anemia, elevated BUN but urine is not concentrated   Dg Chest 2 View  04/06/2012  *RADIOLOGY REPORT*  Clinical Data: Altered mental status, confusion  CHEST - 2 VIEW  Comparison: 01/06/2012; 01/03/2012; 05/24/2005  Findings:  Grossly unchanged cardiac silhouette and mediastinal contours post median sternotomy and CABG.  Grossly unchanged peri and infrahilar heterogeneous opacities.  There is chronic mild elevation of the right hemidiaphragm.  No focal airspace opacities.  No pleural effusion or pneumothorax.  No definite evidence of pulmonary edema. Unchanged bones.  IMPRESSION: 1.  No acute cardiopulmonary disease. 2.  Grossly unchanged peri and infrahilar heterogeneous opacities, stable since the 2007 examination and favored to represent atelectasis.   Original Report Authenticated By: Tacey Ruiz, MD    Ct Head Wo Contrast  04/06/2012  *RADIOLOGY REPORT*  Clinical Data: Altered mental status  CT HEAD WITHOUT CONTRAST  Technique:  Contiguous axial images were obtained from the base of the skull through the vertex without contrast.  Comparison: 04/11/2010  Findings: No skull fracture is noted.  Paranasal sinuses and mastoid air cells are unremarkable.  No intracranial hemorrhage, mass effect or midline shift.  No acute infarction.  Mild cerebral atrophy is stable.  Stable chronic mild white matter disease.  No mass lesion is noted on this unenhanced scan.  Ventricular size is stable from prior exam.  IMPRESSION: No acute intracranial abnormality.  Stable cerebral atrophy and chronic mild white matter disease.   Original Report Authenticated By: Natasha Mead, M.D.    MR  Brain Wo Contrast  04/06/2012  *RADIOLOGY REPORT*  Clinical Data: Confusion.  MRI HEAD WITHOUT CONTRAST  Technique:  Multiplanar, multiecho pulse sequences of the brain and surrounding  structures were obtained according to standard protocol without intravenous contrast.  Comparison: Head CT same day  Findings: Diffusion imaging does not show any acute or subacute infarction.  There are mild chronic  small vessel changes of the pons.  No focal cerebellar abnormality.  The cerebral hemispheres show moderate chronic appearing small vessel changes throughout the deep and subcortical white matter.  No cortical or large vessel territory infarction.  No mass lesion, hemorrhage, hydrocephalus or extra-axial collection.  No pituitary mass.  No fluid in the sinuses are middle ears.  Small amount of fluid in the mastoid air cells on the right, probably subclinical.  IMPRESSION: No acute or reversible findings.  Age related atrophy and chronic small vessel disease affecting the cerebral hemispheric white matter.  The differential diagnosis does include demyelinating disease, but that seems less likely.   Original Report Authenticated By: Paulina Fusi, M.D.      Date: 04/06/2012  Rate: 77  Rhythm: normal sinus rhythm  QRS Axis: normal  Intervals: normal  ST/T [place] abnormalities: normal  Conduction Disutrbances:none  Narrative Interpretation:   Old EKG Reviewed: changes noted from 01/06/2012 did have PVC's    1. Hepatic encephalopathy    Plan discharge  Devoria Albe, MD, FACEP    MDM   I personally performed the services described in this documentation, which was scribed in my presence. The recorded information has been reviewed and considered.  Devoria Albe, MD, Armando Gang      Ward Givens, MD 04/06/12 [id number]

## 2012-04-06 NOTE — ED Notes (Signed)
Wife says confusion since Tuesday. Alert, talking at triage.  Ambulatory, Knows age, where he is. Says month is Feb.  No NV , no fever or pain

## 2012-04-06 NOTE — ED Notes (Signed)
Pt returned from ct scan. Family member at bedside.

## 2012-04-06 NOTE — ED Notes (Signed)
Patient assisted to restroom and back to room. Tolerated well.

## 2012-04-06 NOTE — ED Notes (Signed)
Pt sitting in bed, talking quietly w/ wife. Voices no complaints at present.

## 2012-04-06 NOTE — ED Notes (Signed)
Spouse states pt is having problems with memory. States unable to find his underwear, put toothpaste on tooth brush, find the phone, ect. Pt is alert and oriented to place, person, and is able to tell day of the week and year. Pt stated this month was February.

## 2012-04-06 NOTE — ED Notes (Signed)
Pt passed swallow screen w/o difficulty. Given diet coke.

## 2012-04-06 NOTE — ED Notes (Signed)
Pt returned from mri

## 2012-04-13 ENCOUNTER — Telehealth: Payer: Self-pay

## 2012-04-13 ENCOUNTER — Encounter: Payer: Self-pay | Admitting: Internal Medicine

## 2012-04-13 ENCOUNTER — Encounter (HOSPITAL_COMMUNITY): Payer: Self-pay | Admitting: Pharmacy Technician

## 2012-04-13 ENCOUNTER — Other Ambulatory Visit: Payer: Self-pay

## 2012-04-13 ENCOUNTER — Ambulatory Visit (INDEPENDENT_AMBULATORY_CARE_PROVIDER_SITE_OTHER): Payer: Medicare Other | Admitting: Internal Medicine

## 2012-04-13 VITALS — BP 114/62 | HR 83 | Temp 97.4°F | Ht 67.0 in | Wt 159.6 lb

## 2012-04-13 DIAGNOSIS — I8500 Esophageal varices without bleeding: Secondary | ICD-10-CM

## 2012-04-13 DIAGNOSIS — K7290 Hepatic failure, unspecified without coma: Secondary | ICD-10-CM

## 2012-04-13 DIAGNOSIS — K7682 Hepatic encephalopathy: Secondary | ICD-10-CM

## 2012-04-13 NOTE — Patient Instructions (Signed)
EGD now to follow-up on varices  Decrease Lasix to 40 mg daily  Decrease Aldactone to 50 mg twice daily  Continue lactulose  Begin Xifaxan twice daily  B-Met blood work in 2 weeks

## 2012-04-13 NOTE — Telephone Encounter (Signed)
Mailed the lab order for BMET on 04/27/2012 to the pt.

## 2012-04-13 NOTE — Progress Notes (Signed)
Primary Care Physician:  HALL,ZACK, MD Primary Gastroenterologist:  Dr. Fernando Stoiber  Pre-Procedure History & Physical: HPI:  [name] is a 66 y.o. male here for followup of Nash cirrhosis. Recent ED visit for worsening encephalopathy. MRI labs all good except BUN was elevated at 31. Patient relatively constipated recently lactulose continued. 3 bowel movements daily. Patient states he still a little "goofy". Word finding problems. Some disorientation and difficulties with tasks. He continues to work and has continued to drive as well.  Having 3 bowel movements daily with lactulose 30 cc 3 times a day. No fever or chills. Edema well controlled. It's been 18 months since he had an EGD. He has not been on Naldolol for this period of time. Weight stable at 159.  Past Medical History  Diagnosis Date  . Anemia     thrombocytopenia  . History of transfusion of whole blood   . Cirrhosis of liver     NASH, vaccinated for HepA/B on 05/31/11 afp 1.6, thrombus in splenic vein per ct  . Coronary artery disease 2004    s/p CAGB  . Hyperlipidemia   . Hypothyroidism   . GERD (gastroesophageal reflux disease)   . Atherosclerosis   . Overactive bladder   . Erectile dysfunction   . Gynecomastia, male     RIGHT  . Thrombocytopenia   . DM (diabetes mellitus)   . Esophageal varices      EGD 05/13/10, three columns grade II to III EV s/p banding, second banding since 01/2010.>Portal gastropathy.. >5 banding sessions in the past.  . Spleen enlarged   . Thrombus     Chronic splenic, portal and smv    Past Surgical History  Procedure Date  . Esophagogastroduodenoscopy 01/2010    4 COLUMNS 2-3 GRADE ESOPHAGEAL VARICES,S/P BANDING,PORTAL GASTROPATHY  . Coronary artery bypass graft 2004  . Cardiac catheterization 10/09/06  . Colonoscopy 01/2010    portal colopathy, sigmoid AVM (nonbleeding)  . Esophagogastroduodenoscopy June 2012    Dr. Rana Hochstein-->3 columns of grade 1 esophageal varices with overlying scar, no  banding treatment required, portal gastropathy. Next EGD in December 2013    Prior to Admission medications   Medication Sig Start Date End Date Taking? Authorizing Provider  atorvastatin (LIPITOR) 10 MG tablet Take 10 mg by mouth every morning.    Yes Historical Provider, MD  furosemide (LASIX) 20 MG tablet Take 40 mg by mouth daily. 02/16/12  Yes Leslie S Lewis, PA  insulin glargine (LANTUS) 100 UNIT/ML injection Inject 10 Units into the skin at bedtime.   Yes Historical Provider, MD  isosorbide mononitrate (IMDUR) 30 MG 24 hr tablet Take 30 mg by mouth every morning.    Yes Historical Provider, MD  lactulose (CHRONULAC) 10 GM/15ML solution Take 10 g by mouth 2 (two) times daily.  04/11/12  Yes Historical Provider, MD  levothyroxine (SYNTHROID, LEVOTHROID) 150 MCG tablet Take 150 mcg by mouth every morning.  09/09/11  Yes Historical Provider, MD  sitaGLIPtan (JANUVIA) 100 MG tablet Take 100 mg by mouth every morning.    Yes Historical Provider, MD  spironolactone (ALDACTONE) 25 MG tablet Take 50 mg by mouth 2 (two) times daily. 03/26/12  Yes Kandice L Jones, NP    Allergies as of 04/13/2012  . (No Known Allergies)    Family History  Problem Relation Age of Onset  . Stroke Father 79  . Heart disease Mother 85    CHF  . Heart attack Brother   . Liver disease Neg Hx   .   Colon cancer Neg Hx     History   Social History  . Marital Status: Married    Spouse Name: N/A    Number of Children: 4  . Years of Education: N/A   Occupational History  .     Social History Main Topics  . Smoking status: Never Smoker   . Smokeless tobacco: Never Used  . Alcohol Use: 0.0 oz/week     Comment: occasionally  . Drug Use: No  . Sexually Active: Not Currently   Other Topics Concern  . Not on file   Social History Narrative  . No narrative on file    Review of Systems: See HPI, otherwise negative ROS  Physical Exam: BP 114/62  Pulse 83  Temp 97.4 F (36.3 C) (Oral)  Ht 5' 7" (1.702  m)  Wt 159 lb 9.6 oz (72.394 kg)  BMI 25.00 kg/m2 General:   Awake conversant oriented to place and time. He does have asterixis. Accompanied by wife. Skin:  Intact without significant lesions or rashes. Eyes:  Sclera clear, no icterus.   Conjunctiva pink. Ears:  Normal auditory acuity. Nose:  No deformity, discharge,  or lesions. Mouth:  No deformity or lesions. Neck:  Supple; no masses or thyromegaly. No significant cervical adenopathy. Lungs:  Clear throughout to auscultation.   No wheezes, crackles, or rhonchi. No acute distress. Heart:  Regular rate and rhythm; no murmurs, clicks, rubs,  or gallops. Abdomen: Non-distended, normal bowel sounds.  Soft and nontender without appreciable mass or hepatosplenomegaly.  Pulses:  Normal pulses noted. Extremities:  Without clubbing or edema.  Impression/Plan:  Recurrent hepatic encephalopathy in a patient with Nash cirrhosis. Recent constipation likely tipped him over. Also BUN of 31 is high for cirrhotic. He may well be volume   contracted which could be contributing to encephalopathy. relatively volume contracted.  Patient continued to have his varices checked again.   Recommendations:  Decrease Lasix to 40 mg once daily. Decrease Aldactone to 50 mg twice daily. Continue salt restricted diet.  Continue lactulose to titrate to 3 bowel movements daily.  Add Xifaxan 550 mg orally twice daily. Discussed with patient and wife.  I've advised patient to stop working, not to drive or operate his boat, not to operate any machinery, don't use the stove, etc. and lock up all firearms. This was also reviewed with wife who is present in some detail. They seem to understand the reasoning behind these recommendations.   EGD with possible esophageal band ligation to be performed in the near future.The risks, benefits, limitations, alternatives and imponderables have been reviewed with the patient. Potential for esophageal dilation, biopsy, etc. have also  been reviewed.  Questions have been answered. All parties agreeable. 

## 2012-04-16 ENCOUNTER — Ambulatory Visit (HOSPITAL_COMMUNITY)
Admission: RE | Admit: 2012-04-16 | Discharge: 2012-04-16 | Disposition: A | Discharge status: Home / Self Care | Payer: Medicare Other | Source: Ambulatory Visit | Attending: Internal Medicine | Admitting: Internal Medicine

## 2012-04-16 ENCOUNTER — Encounter (HOSPITAL_COMMUNITY)
Admission: RE | Disposition: A | Discharge status: Home / Self Care | Payer: Self-pay | Source: Ambulatory Visit | Attending: Internal Medicine

## 2012-04-16 ENCOUNTER — Encounter (HOSPITAL_COMMUNITY): Payer: Self-pay | Admitting: *Deleted

## 2012-04-16 DIAGNOSIS — K7460 Unspecified cirrhosis of liver: Secondary | ICD-10-CM | POA: Insufficient documentation

## 2012-04-16 DIAGNOSIS — K319 Disease of stomach and duodenum, unspecified: Secondary | ICD-10-CM

## 2012-04-16 DIAGNOSIS — K31819 Angiodysplasia of stomach and duodenum without bleeding: Secondary | ICD-10-CM

## 2012-04-16 DIAGNOSIS — K7290 Hepatic failure, unspecified without coma: Secondary | ICD-10-CM

## 2012-04-16 DIAGNOSIS — I8500 Esophageal varices without bleeding: Secondary | ICD-10-CM

## 2012-04-16 DIAGNOSIS — E119 Type 2 diabetes mellitus without complications: Secondary | ICD-10-CM | POA: Insufficient documentation

## 2012-04-16 DIAGNOSIS — Z01812 Encounter for preprocedural laboratory examination: Secondary | ICD-10-CM | POA: Insufficient documentation

## 2012-04-16 DIAGNOSIS — Z951 Presence of aortocoronary bypass graft: Secondary | ICD-10-CM | POA: Insufficient documentation

## 2012-04-16 DIAGNOSIS — I8510 Secondary esophageal varices without bleeding: Secondary | ICD-10-CM | POA: Insufficient documentation

## 2012-04-16 HISTORY — PX: ESOPHAGOGASTRODUODENOSCOPY: SHX5428

## 2012-04-16 SURGERY — EGD (ESOPHAGOGASTRODUODENOSCOPY)
Anesthesia: Moderate Sedation

## 2012-04-16 MED ORDER — MIDAZOLAM HCL 5 MG/5ML IJ SOLN
INTRAMUSCULAR | Status: AC
  Filled 2012-04-16: qty 10

## 2012-04-16 MED ORDER — ONDANSETRON HCL 4 MG/2ML IJ SOLN
INTRAMUSCULAR | Status: DC | PRN
  Administered 2012-04-16: 4 mg via INTRAVENOUS

## 2012-04-16 MED ORDER — ONDANSETRON HCL 4 MG/2ML IJ SOLN
INTRAMUSCULAR | Status: AC
  Filled 2012-04-16: qty 2

## 2012-04-16 MED ORDER — MEPERIDINE HCL 100 MG/ML IJ SOLN
INTRAMUSCULAR | Status: AC
  Filled 2012-04-16: qty 1

## 2012-04-16 MED ORDER — STERILE WATER FOR IRRIGATION IR SOLN
Status: DC | PRN
  Administered 2012-04-16: 09:00:00

## 2012-04-16 MED ORDER — MIDAZOLAM HCL 5 MG/5ML IJ SOLN
INTRAMUSCULAR | Status: DC | PRN
  Administered 2012-04-16 (×2): 1 mg via INTRAVENOUS

## 2012-04-16 MED ORDER — BUTAMBEN-TETRACAINE-BENZOCAINE 2-2-14 % EX AERO
INHALATION_SPRAY | CUTANEOUS | Status: DC | PRN
  Administered 2012-04-16: 2 via TOPICAL

## 2012-04-16 MED ORDER — SODIUM CHLORIDE 0.45 % IV SOLN
INTRAVENOUS | Status: DC
  Administered 2012-04-16: 1000 mL via INTRAVENOUS

## 2012-04-16 MED ORDER — MEPERIDINE HCL 100 MG/ML IJ SOLN
INTRAMUSCULAR | Status: DC | PRN
  Administered 2012-04-16 (×2): 25 mg via INTRAVENOUS

## 2012-04-16 NOTE — H&P (View-Only) (Signed)
Primary Care Physician:  Dwana Melena, MD Primary Gastroenterologist:  Dr. Jena Gauss  Pre-Procedure History & Physical: HPI:  [name] is a 67 y.o. male here for followup of Elita Boone cirrhosis. Recent ED visit for worsening encephalopathy. MRI labs all good except BUN was elevated at 31. Patient relatively constipated recently lactulose continued. 3 bowel movements daily. Patient states he still a little "goofy". Word finding problems. Some disorientation and difficulties with tasks. He continues to work and has continued to drive as well.  Having 3 bowel movements daily with lactulose 30 cc 3 times a day. No fever or chills. Edema well controlled. It's been 18 months since he had an EGD. He has not been on Naldolol for this period of time. Weight stable at 159.  Past Medical History  Diagnosis Date  . Anemia     thrombocytopenia  . History of transfusion of whole blood   . Cirrhosis of liver     NASH, vaccinated for HepA/B on 05/31/11 afp 1.6, thrombus in splenic vein per ct  . Coronary artery disease 2004    s/p CAGB  . Hyperlipidemia   . Hypothyroidism   . GERD (gastroesophageal reflux disease)   . Atherosclerosis   . Overactive bladder   . Erectile dysfunction   . Gynecomastia, male     RIGHT  . Thrombocytopenia   . DM (diabetes mellitus)   . Esophageal varices      EGD 05/13/10, three columns grade II to III EV s/p banding, second banding since 01/2010.>Portal gastropathy.. >5 banding sessions in the past.  . Spleen enlarged   . Thrombus     Chronic splenic, portal and smv    Past Surgical History  Procedure Date  . Esophagogastroduodenoscopy 01/2010    4 COLUMNS 2-3 GRADE ESOPHAGEAL VARICES,S/P BANDING,PORTAL GASTROPATHY  . Coronary artery bypass graft 2004  . Cardiac catheterization 10/09/06  . Colonoscopy 01/2010    portal colopathy, sigmoid AVM (nonbleeding)  . Esophagogastroduodenoscopy June 2012    Dr. Sparkle Aube-->3 columns of grade 1 esophageal varices with overlying scar, no  banding treatment required, portal gastropathy. Next EGD in December 2013    Prior to Admission medications   Medication Sig Start Date End Date Taking? Authorizing Provider  atorvastatin (LIPITOR) 10 MG tablet Take 10 mg by mouth every morning.    Yes Historical Provider, MD  furosemide (LASIX) 20 MG tablet Take 40 mg by mouth daily. 02/16/12  Yes Tiffany Kocher, PA  insulin glargine (LANTUS) 100 UNIT/ML injection Inject 10 Units into the skin at bedtime.   Yes Historical Provider, MD  isosorbide mononitrate (IMDUR) 30 MG 24 hr tablet Take 30 mg by mouth every morning.    Yes Historical Provider, MD  lactulose (CHRONULAC) 10 GM/15ML solution Take 10 g by mouth 2 (two) times daily.  04/11/12  Yes Historical Provider, MD  levothyroxine (SYNTHROID, LEVOTHROID) 150 MCG tablet Take 150 mcg by mouth every morning.  09/09/11  Yes Historical Provider, MD  sitaGLIPtan (JANUVIA) 100 MG tablet Take 100 mg by mouth every morning.    Yes Historical Provider, MD  spironolactone (ALDACTONE) 25 MG tablet Take 50 mg by mouth 2 (two) times daily. 03/26/12  Yes Joselyn Arrow, NP    Allergies as of 04/13/2012  . (No Known Allergies)    Family History  Problem Relation Age of Onset  . Stroke Father 25  . Heart disease Mother 70    CHF  . Heart attack Brother   . Liver disease Neg Hx   .  Colon cancer Neg Hx     History   Social History  . Marital Status: Married    Spouse Name: N/A    Number of Children: 4  . Years of Education: N/A   Occupational History  .     Social History Main Topics  . Smoking status: Never Smoker   . Smokeless tobacco: Never Used  . Alcohol Use: 0.0 oz/week     Comment: occasionally  . Drug Use: No  . Sexually Active: Not Currently   Other Topics Concern  . Not on file   Social History Narrative  . No narrative on file    Review of Systems: See HPI, otherwise negative ROS  Physical Exam: BP 114/62  Pulse 83  Temp 97.4 F (36.3 C) (Oral)  Ht 5\' 7"  (1.702  m)  Wt 159 lb 9.6 oz (72.394 kg)  BMI 25.00 kg/m2 General:   Awake conversant oriented to place and time. He does have asterixis. Accompanied by wife. Skin:  Intact without significant lesions or rashes. Eyes:  Sclera clear, no icterus.   Conjunctiva pink. Ears:  Normal auditory acuity. Nose:  No deformity, discharge,  or lesions. Mouth:  No deformity or lesions. Neck:  Supple; no masses or thyromegaly. No significant cervical adenopathy. Lungs:  Clear throughout to auscultation.   No wheezes, crackles, or rhonchi. No acute distress. Heart:  Regular rate and rhythm; no murmurs, clicks, rubs,  or gallops. Abdomen: Non-distended, normal bowel sounds.  Soft and nontender without appreciable mass or hepatosplenomegaly.  Pulses:  Normal pulses noted. Extremities:  Without clubbing or edema.  Impression/Plan:  Recurrent hepatic encephalopathy in a patient with Elita Boone cirrhosis. Recent constipation likely tipped him over. Also BUN of 31 is high for cirrhotic. He may well be volume   contracted which could be contributing to encephalopathy. relatively volume contracted.  Patient continued to have his varices checked again.   Recommendations:  Decrease Lasix to 40 mg once daily. Decrease Aldactone to 50 mg twice daily. Continue salt restricted diet.  Continue lactulose to titrate to 3 bowel movements daily.  Add Xifaxan 550 mg orally twice daily. Discussed with patient and wife.  I've advised patient to stop working, not to drive or operate his boat, not to operate any machinery, don't use the stove, etc. and lock up all firearms. This was also reviewed with wife who is present in some detail. They seem to understand the reasoning behind these recommendations.   EGD with possible esophageal band ligation to be performed in the near future.The risks, benefits, limitations, alternatives and imponderables have been reviewed with the patient. Potential for esophageal dilation, biopsy, etc. have also  been reviewed.  Questions have been answered. All parties agreeable.

## 2012-04-16 NOTE — Interval H&P Note (Signed)
History and Physical Interval Note:  04/16/2012 8:17 AM  [name]  has presented today for surgery, with the diagnosis of Esophageal Varices  and Hepatic encephalopathy  The various methods of treatment have been discussed with the patient and family. After consideration of risks, benefits and other options for treatment, the patient has consented to  Procedure(s) with comments: ESOPHAGOGASTRODUODENOSCOPY (EGD) (N/A) - 8:30 as a surgical intervention .  The patient's history has been reviewed, patient examined, no change in status, stable for surgery.  I have reviewed the patient's chart and labs.  Questions were answered to the patient's satisfaction.     [name]  EGD per plan.The risks, benefits, limitations, alternatives and imponderables have been reviewed with the patient. Potential for esophageal dilation, biopsy, etc. have also been reviewed.  Questions have been answered. All parties agreeable.

## 2012-04-16 NOTE — Op Note (Signed)
[facility] [address] [place], [address]   ENDOSCOPY PROCEDURE REPORT  PATIENT: [name], [name].  MR#: [id number] BIRTHDATE: 1945-06-30 , 66  yrs. old GENDER: Male ENDOSCOPIST: R.  Roetta Sessions, MD FACP FACG REFERRED BY:  Catalina Pizza, M.D. PROCEDURE DATE:  04/16/2012 PROCEDURE:     EGD with esophageal band ligation  INDICATIONS:     History of NASH / cirrhosis / esophageal varices; Surveillance examination.  INFORMED CONSENT:   The risks, benefits, limitations, alternatives and imponderables have been discussed.  The potential for biopsy, esophogeal dilation, etc. have also been reviewed.  Questions have been answered.  All parties agreeable.  Please see the history and physical in the medical record for more information.  MEDICATIONS:  DESCRIPTION OF PROCEDURE:   The EG-[id number] ([id number])  endoscope was introduced through the mouth and advanced to the second portion of the duodenum without difficulty or limitations.  The mucosal surfaces were surveyed very carefully during advancement of the scope and upon withdrawal.  Retroflexion view of the proximal stomach and esophagogastric junction was performed.      FINDINGS:  3 columns grade 2-3 esophageal varices without bleeding stigmata. Overlying esophageal mucosa appeared normal. Stomach empty. Portal gastropathy.  Gastric antral vascular ectasias present. No ulcer or infiltrating process. No gastric varices. Patent pylorus. Normal first and second portion of the duodenum.  THERAPEUTIC / DIAGNOSTIC MANEUVERS PERFORMED:  Esophageal variceal band ligation performed. Total of 4 bands placed on 3 columns. Good hemostasis maintained.   COMPLICATIONS:  None  IMPRESSION:   Esophageal varices. Portal gastropathy. Gastric antral vascular ectasia.  RECOMMENDATIONS:  Continue Naldolod as well as plan as outlined in last week's office note.    _______________________________ R. Roetta Sessions, MD FACP  [facility] eSigned:  R. Roetta Sessions, MD FACP [facility] 04/16/2012 8:56 AM     CC:

## 2012-04-20 ENCOUNTER — Encounter (HOSPITAL_COMMUNITY): Payer: Self-pay | Admitting: Internal Medicine

## 2012-04-23 ENCOUNTER — Telehealth: Payer: Self-pay | Admitting: Internal Medicine

## 2012-04-23 NOTE — Telephone Encounter (Signed)
Called pt; wife reports him doing better on Xifaxan but cost is steep $447 out of pocket monthly.  OV coming up; lets see if we can get him samples; wife states he's ineligible for discount cards

## 2012-04-23 NOTE — Telephone Encounter (Signed)
Sent email to rep to see if we can get some samples for this pt.

## 2012-04-26 LAB — BASIC METABOLIC PANEL
CO2: 25 mEq/L (ref 19–32)
Calcium: 8.8 mg/dL (ref 8.4–10.5)
Chloride: 103 mEq/L (ref 96–112)
Creat: 1.03 mg/dL (ref 0.50–1.35)
Glucose, Bld: 237 mg/dL — ABNORMAL HIGH (ref 70–99)

## 2012-04-30 NOTE — Telephone Encounter (Signed)
I got an email from the drug rep and they are coming at lunch tomorrow and will bring samples and some information to help the pt.

## 2012-04-30 NOTE — Telephone Encounter (Signed)
Thanks

## 2012-05-01 ENCOUNTER — Telehealth: Payer: Self-pay | Admitting: Internal Medicine

## 2012-05-01 NOTE — Telephone Encounter (Signed)
Referral has been faxed to [facility] and they will contact the patient for date & time

## 2012-05-01 NOTE — Telephone Encounter (Signed)
[name], please check on this.

## 2012-05-01 NOTE — Telephone Encounter (Signed)
Filling out insurance papers for Mr. Svec.  I note MELD 17 now. He was seen at [facility] transplant clinic previously. Coronary artery disease (RCA lesion) difficulty to get at. He was not felt to be a very good liver transplant candidate at that time nor was he sick enough. Since that evaluation he had a variceal hemorrhage, ascites and now encephalopathy. [facility] folks did not absolutely say no; but also noted Ambulance person covered a [facility] transplant but not [facility]. The patient has never been seen at [facility]. I told patient's wife today we are to go ahead and send him down for a liver transplant evaluation just to see what they say as transplant  would be the only chance of curing his ongoing complication issues related to cirrhosis. She is agreeable.. We will check into insurance status and proceed with either a referral to [facility] or a repeat referral to [facility].

## 2012-05-01 NOTE — Progress Notes (Signed)
Faxed to PCP

## 2012-05-02 ENCOUNTER — Other Ambulatory Visit: Payer: Self-pay

## 2012-05-02 MED ORDER — LACTULOSE 10 GM/15ML PO SOLN: 10.0000 g | Freq: Three times a day (TID) | ORAL | Status: DC

## 2012-05-08 ENCOUNTER — Encounter: Payer: Self-pay | Admitting: Gastroenterology

## 2012-05-08 ENCOUNTER — Ambulatory Visit (INDEPENDENT_AMBULATORY_CARE_PROVIDER_SITE_OTHER): Payer: Medicare Other | Admitting: Gastroenterology

## 2012-05-08 ENCOUNTER — Ambulatory Visit: Payer: Medicare Other | Admitting: Urgent Care

## 2012-05-08 VITALS — BP 115/66 | HR 73 | Temp 97.4°F | Ht 66.0 in | Wt 174.6 lb

## 2012-05-08 DIAGNOSIS — K7460 Unspecified cirrhosis of liver: Secondary | ICD-10-CM

## 2012-05-08 MED ORDER — SPIRONOLACTONE 25 MG PO TABS: ORAL_TABLET | ORAL | Status: DC

## 2012-05-08 MED ORDER — FUROSEMIDE 20 MG PO TABS: ORAL_TABLET | ORAL | Status: DC

## 2012-05-08 NOTE — Progress Notes (Signed)
Referring [name]: Dwana Melena, MD Primary Care Physician:  Dwana Melena, MD Primary Gastroenterologist: Dr. Jena Gauss   Chief Complaint  Patient presents with  . Follow-up    HPI:   [name] is a pleasant 67 year old male who presents today in follow-up with history of NASH cirrhosis, recurrent hepatic encephalopathy. Lasix decreased to 40 mg daily 2/7. Aldactone decreased to 50 twice daily. Salt restricted diet. Feels like he is retaining fluid. Feels like abdomen is swelling, tighter. Up to 174 today, was 159 Feb 7th. Developing a cough. No abdominal pain. No N/V. Denies confusion, mental status changes. Has enough Xifaxan to get through next Wednesday. Unfortunately, out-of-pocket expense for Xifaxan was in the 400$ range. He is not a candidate for patient assistance as he has insurance.   Recent BMP on file due to changes in diuretics.  Lab Results  Component Value Date   CREATININE 1.03 04/26/2012   BUN 19 04/26/2012   NA 134* 04/26/2012   K 4.5 04/26/2012   CL 103 04/26/2012   CO2 25 04/26/2012     Past Medical History  Diagnosis Date  . Anemia     thrombocytopenia  . History of transfusion of whole blood   . Cirrhosis of liver     NASH, vaccinated for HepA/B on 05/31/11 afp 1.6, thrombus in splenic vein per ct  . Coronary artery disease 2004    s/p CAGB  . Hyperlipidemia   . Hypothyroidism   . GERD (gastroesophageal reflux disease)   . Atherosclerosis   . Gynecomastia, male     RIGHT  . Thrombocytopenia   . DM (diabetes mellitus)   . Esophageal varices      EGD 05/13/10, three columns grade II to III EV s/p banding, second banding since 01/2010.>Portal gastropathy.. >5 banding sessions in the past.  . Spleen enlarged   . Thrombus     Chronic splenic, portal and smv  . Overactive bladder   . Erectile dysfunction     Past Surgical History  Procedure Laterality Date  . Esophagogastroduodenoscopy  01/2010    4 COLUMNS 2-3 GRADE ESOPHAGEAL VARICES,S/P BANDING,PORTAL  GASTROPATHY  . Coronary artery bypass graft  2004  . Cardiac catheterization  10/09/06  . Colonoscopy  01/2010    portal colopathy, sigmoid AVM (nonbleeding)  . Esophagogastroduodenoscopy  June 2012    Dr. Rourk-->3 columns of grade 1 esophageal varices with overlying scar, no banding treatment required, portal gastropathy. Next EGD in December 2013  . Esophagogastroduodenoscopy N/A 04/16/2012    Dr. Jena Gauss: 3 columns Grade 2-3 esophageal varices, portal gastropathy, gastric antral vascular ectasia    Current Outpatient Prescriptions  Medication Sig Dispense Refill  . atorvastatin (LIPITOR) 10 MG tablet Take 10 mg by mouth every morning.       . furosemide (LASIX) 20 MG tablet Take 2 tablets in the morning and 1 tablet in the evening.  90 tablet  1  . insulin glargine (LANTUS) 100 UNIT/ML injection Inject 10 Units into the skin at bedtime.      . isosorbide mononitrate (IMDUR) 30 MG 24 hr tablet Take 30 mg by mouth every morning.       . lactulose (CHRONULAC) 10 GM/15ML solution Take 15 mLs (10 g total) by mouth 3 (three) times daily.  1892 mL  3  . levothyroxine (SYNTHROID, LEVOTHROID) 150 MCG tablet Take 150 mcg by mouth every morning.       . nadolol (CORGARD) 20 MG tablet Take 20 mg by mouth daily.      Marland Kitchen  rifaximin (XIFAXAN) 550 MG TABS Take 550 mg by mouth 2 (two) times daily.      . sitaGLIPtan (JANUVIA) 100 MG tablet Take 100 mg by mouth every morning.       Marland Kitchen spironolactone (ALDACTONE) 25 MG tablet Take 2 tablets in the morning and 3 tablets in the evening.  150 tablet  1  . triamcinolone lotion (KENALOG) 0.1 % Apply 1 application topically Twice daily.       No current facility-administered medications for this visit.    Allergies as of 05/08/2012  . (No Known Allergies)    Family History  Problem Relation Age of Onset  . Stroke Father 62  . Heart disease Mother 12    CHF  . Heart attack Brother   . Liver disease Neg Hx   . Colon cancer Neg Hx     History   Social  History  . Marital Status: Married    Spouse Name: N/A    Number of Children: 4  . Years of Education: N/A   Occupational History  .     Social History Main Topics  . Smoking status: Never Smoker   . Smokeless tobacco: Never Used  . Alcohol Use: 0.0 oz/week     Comment: occasionally  . Drug Use: No  . Sexually Active: Not Currently   Other Topics Concern  . None   Social History Narrative  . None    Review of Systems: Negative unless otherwise mentioned in HPI  Physical Exam: BP 115/66  Pulse 73  Temp(Src) 97.4 F (36.3 C) (Oral)  Ht 5\' 6"  (1.676 m)  Wt 174 lb 9.6 oz (79.198 kg)  BMI 28.19 kg/m2 General:   Alert and oriented. No distress noted. Pleasant and cooperative.  Head:  Normocephalic and atraumatic. Eyes:  Conjuctiva clear without scleral icterus. Heart:  S1, S2 present without murmurs, rubs, or gallops. Regular rate and rhythm. Abdomen:  +BS, soft, +ascites, non-tense.  Msk:  Symmetrical without gross deformities. Normal posture. Extremities:  Without edema. Neurologic:  Alert and  oriented x4;  grossly normal neurologically. Skin:  Intact without significant lesions or rashes. Psych:  Alert and cooperative. Normal mood and affect.

## 2012-05-08 NOTE — Patient Instructions (Addendum)
Take Lasix 2 tablets in the morning and 1 tablet in the evening. This is a dosage of 40 mg in the morning and 20 mg in the evening.  Take Spironolactone 2 tablets in the morning and 3 tablets in the evening. This is a dosage of 50 mg in the morning and 75 mg in the evening.   Please have blood work rechecked in 1 week.   You will see Dr. Jena Gauss again in 4-6 weeks.  Please call if you notice any worsening of abdominal swelling, any pain, nausea, vomiting, or confusion.   Referral was sent to [facility] transplant on 02/27 and they are going to review his records and contact him with date & time

## 2012-05-09 ENCOUNTER — Telehealth: Payer: Self-pay | Admitting: Gastroenterology

## 2012-05-09 NOTE — Telephone Encounter (Signed)
Please mail the patient forms for Xifaxan to [name]. Also, can we check on further samples for him? Thanks!

## 2012-05-09 NOTE — Assessment & Plan Note (Signed)
67 year old male with history of NASH cirrhosis, recent surveillance EGD on file from 04/2012. History of encephalopathy, doing well now with Lactulose and having 3 soft bowel movements a day. Recently, diuretics decreased a few weeks ago. Notes recurrence of abdominal distension, no lower extremity edema. On exam, non-tense ascites noted. He is up approximately 15 lbs from his last visit. I would like to slightly increase his diuretics, with caution. He also is unable to afford Xifaxan, and he is ineligible for discount savings card. We will provide samples and additional forms for him to fill out in the hopes of decreasing the burden of this cost (400$).   He would like to see Dr. Jena Gauss again to talk specifically about liver transplantation. He has been referred to [facility] and waiting for them to contact him. Our office will check on the status of this.  Add Lasix 20 mg in the evening, for a total of 40 mg in the morning and 20 mg in the evening. Continue Aldactone 50 mg in the morning and an additional 25 mg at night for a total of 75 mg in the evening.  BMP in 1 week. Follow-up with Dr. Jena Gauss only in 4-6 weeks.

## 2012-05-09 NOTE — Progress Notes (Signed)
Faxed to PCP

## 2012-05-10 NOTE — Telephone Encounter (Signed)
Patient assistance paper work mailed to the pt.

## 2012-05-14 NOTE — Telephone Encounter (Signed)
pts wife called today- she said they checked with the pts insurance and they do not cover [facility]. They do cover [facility] and pt would like referral sent to [facility] instead of [facility].   Benedetto Goad, please send referral.

## 2012-05-15 NOTE — Telephone Encounter (Signed)
Finally received message from drug rep. He is going to stop by the office one day this week.

## 2012-05-15 NOTE — Telephone Encounter (Signed)
Referral has been revised to [facility]

## 2012-05-17 LAB — BASIC METABOLIC PANEL WITH GFR
BUN: 21 mg/dL (ref 6–23)
CO2: 26 meq/L (ref 19–32)
Calcium: 8.8 mg/dL (ref 8.4–10.5)
Chloride: 106 meq/L (ref 96–112)
Creat: 1.06 mg/dL (ref 0.50–1.35)
Glucose, Bld: 257 mg/dL — ABNORMAL HIGH (ref 70–99)
Potassium: 3.8 meq/L (ref 3.5–5.3)
Sodium: 139 meq/L (ref 135–145)

## 2012-05-18 NOTE — Telephone Encounter (Signed)
Drug rep called again today and said he could come until Monday.

## 2012-05-21 ENCOUNTER — Other Ambulatory Visit: Payer: Self-pay | Admitting: Gastroenterology

## 2012-05-21 ENCOUNTER — Other Ambulatory Visit: Payer: Self-pay

## 2012-05-21 NOTE — Progress Notes (Signed)
Quick Note:  His renal function looks good. Electrolytes are good. How is he doing? History of cirrhosis. Does he have improved symptoms?  Let's recheck BMP again in 1 week. ______

## 2012-05-21 NOTE — Telephone Encounter (Signed)
Samples left up front for pt. Pt is aware. He is going to bring back patient assistance forms this week.

## 2012-05-21 NOTE — Progress Notes (Signed)
Quick Note:  Pt aware. He is feeling good right now. Lab order given to pt. ______

## 2012-05-22 NOTE — Progress Notes (Signed)
Quick Note:    Good.  ______

## 2012-05-24 ENCOUNTER — Other Ambulatory Visit: Payer: Self-pay | Admitting: Gastroenterology

## 2012-05-24 MED ORDER — LACTULOSE 10 GM/15ML PO SOLN: 20.0000 g | Freq: Three times a day (TID) | ORAL | Status: DC

## 2012-05-29 LAB — BASIC METABOLIC PANEL
BUN: 18 mg/dL (ref 6–23)
CO2: 25 mEq/L (ref 19–32)
Chloride: 104 mEq/L (ref 96–112)
Creat: 0.91 mg/dL (ref 0.50–1.35)
Glucose, Bld: 168 mg/dL — ABNORMAL HIGH (ref 70–99)

## 2012-05-30 ENCOUNTER — Telehealth: Payer: Self-pay | Admitting: *Deleted

## 2012-05-30 NOTE — Telephone Encounter (Signed)
[name] called this am. He received a call from his insurance company in regards to a prior authorization for his medication today, stating that he needed to sign the request form.  He wants to know if we still have the form here and if he can come in and sign it.  Please follow up with him. Thank you.

## 2012-05-30 NOTE — Telephone Encounter (Signed)
Pt will come by Thursday to sign papers at [facility] desk.  Let us know when he gets here Thursday.

## 2012-05-31 NOTE — Telephone Encounter (Signed)
Pt came by and signed papers they have been faxed back to the Patient assistance company

## 2012-06-04 NOTE — Progress Notes (Signed)
Quick Note:  BUN, Cr, electrolytes all look good. Keep appt with Dr. Jena Gauss only in April 2014.  Continue current dosing of diuretics. ______

## 2012-06-05 NOTE — Progress Notes (Signed)
Quick Note:  Tried to call pt- LMOM ______ 

## 2012-06-12 ENCOUNTER — Ambulatory Visit (INDEPENDENT_AMBULATORY_CARE_PROVIDER_SITE_OTHER): Payer: Medicare Other | Admitting: Internal Medicine

## 2012-06-12 ENCOUNTER — Encounter: Payer: Self-pay | Admitting: Internal Medicine

## 2012-06-12 VITALS — BP 96/51 | HR 62 | Temp 97.3°F | Ht 66.0 in | Wt 170.0 lb

## 2012-06-12 DIAGNOSIS — K7460 Unspecified cirrhosis of liver: Secondary | ICD-10-CM

## 2012-06-12 DIAGNOSIS — K7689 Other specified diseases of liver: Secondary | ICD-10-CM

## 2012-06-12 DIAGNOSIS — R188 Other ascites: Secondary | ICD-10-CM

## 2012-06-12 NOTE — Patient Instructions (Addendum)
Continue Lasix 40 mg in the morning; 20 mg in the evening  Continue Aldactone 50mg  in the morning; 25 mg in the evening (this medication may be   causing some of your breast tenderness and may need to be stopped in the near future)  Continue Naldolol  See Dr. Jason Fila at [facility] as planned this week  Continue lactulose and Xifaxin  Further recommendations to follow

## 2012-06-12 NOTE — Progress Notes (Signed)
Primary Care Physician:  Dwana Melena, MD Primary Gastroenterologist:  Dr. Jena Gauss  Pre-Procedure History & Physical: HPI:  [name] is a 67 y.o. male here for followup of Nash/cirrhosis/HE. Doing better on lactulose and Xifaxan. Obtaining Xifaxan is an issue. Is very expensive. Wife says he's much more mentally clear on Xifaxan on top of lactulose. Diuretics tweaked upward recently; he's lost 4 pounds and he feels he is doing fairly well. Electrolytes done 2 weeks ago.  He is seeing the folks down at [facility] the end of this week. There is a short list of things they requesting and we will try to get those reports down to [facility] by Friday. Patient has complains of some tenderness in both breasts feels right breast is larger than the left breast.  Past Medical History  Diagnosis Date  . Anemia     thrombocytopenia  . History of transfusion of whole blood   . Cirrhosis of liver     NASH, vaccinated for HepA/B on 05/31/11 afp 1.6, thrombus in splenic vein per ct  . Coronary artery disease 2004    s/p CAGB  . Hyperlipidemia   . Hypothyroidism   . GERD (gastroesophageal reflux disease)   . Atherosclerosis   . Gynecomastia, male     RIGHT  . Thrombocytopenia   . DM (diabetes mellitus)   . Esophageal varices      EGD 05/13/10, three columns grade II to III EV s/p banding, second banding since 01/2010.>Portal gastropathy.. >5 banding sessions in the past.  . Spleen enlarged   . Thrombus     Chronic splenic, portal and smv  . Overactive bladder   . Erectile dysfunction     Past Surgical History  Procedure Laterality Date  . Esophagogastroduodenoscopy  01/2010    4 COLUMNS 2-3 GRADE ESOPHAGEAL VARICES,S/P BANDING,PORTAL GASTROPATHY  . Coronary artery bypass graft  2004  . Cardiac catheterization  10/09/06  . Colonoscopy  01/2010    portal colopathy, sigmoid AVM (nonbleeding)  . Esophagogastroduodenoscopy  June 2012    Dr. Tavarion Babington-->3 columns of grade 1 esophageal varices with overlying scar,  no banding treatment required, portal gastropathy. Next EGD in December 2013  . Esophagogastroduodenoscopy N/A 04/16/2012    Dr. Jena Gauss: 3 columns Grade 2-3 esophageal varices, portal gastropathy, gastric antral vascular ectasia    Prior to Admission medications   Medication Sig Start Date End Date Taking? Authorizing Provider  atorvastatin (LIPITOR) 10 MG tablet Take 10 mg by mouth every morning.    Yes Historical Provider, MD  furosemide (LASIX) 20 MG tablet Take 2 tablets in the morning and 1 tablet in the evening. 05/08/12  Yes Nira Retort, NP  insulin glargine (LANTUS) 100 UNIT/ML injection Inject 15 Units into the skin at bedtime.    Yes Historical Provider, MD  isosorbide mononitrate (IMDUR) 30 MG 24 hr tablet Take 30 mg by mouth every morning.    Yes Historical Provider, MD  lactulose (CHRONULAC) 10 GM/15ML solution Take 30 mLs (20 g total) by mouth 3 (three) times daily. 05/24/12  Yes Nira Retort, NP  levothyroxine (SYNTHROID, LEVOTHROID) 150 MCG tablet Take 150 mcg by mouth every morning.  09/09/11  Yes Historical Provider, MD  nadolol (CORGARD) 20 MG tablet Take 20 mg by mouth daily.   Yes Historical Provider, MD  rifaximin (XIFAXAN) 550 MG TABS Take 550 mg by mouth 2 (two) times daily.   Yes Historical Provider, MD  spironolactone (ALDACTONE) 25 MG tablet Take 2 tablets in the morning and  3 tablets in the evening. 05/08/12  Yes Nira Retort, NP  triamcinolone lotion (KENALOG) 0.1 % Apply 1 application topically Twice daily. 04/06/12  Yes Historical Provider, MD  sitaGLIPtan (JANUVIA) 100 MG tablet Take 100 mg by mouth every morning.     Historical Provider, MD    Allergies as of 06/12/2012  . (No Known Allergies)    Family History  Problem Relation Age of Onset  . Stroke Father 46  . Heart disease Mother 19    CHF  . Heart attack Brother   . Liver disease Neg Hx   . Colon cancer Neg Hx     History   Social History  . Marital Status: Married    Spouse Name: N/A    Number of  Children: 4  . Years of Education: N/A   Occupational History  .     Social History Main Topics  . Smoking status: Never Smoker   . Smokeless tobacco: Never Used  . Alcohol Use: 0.0 oz/week     Comment: occasionally  . Drug Use: No  . Sexually Active: Not Currently   Other Topics Concern  . Not on file   Social History Narrative  . No narrative on file    Review of Systems: See HPI, otherwise negative ROS  Physical Exam: BP 96/51  Pulse 62  Temp(Src) 97.3 F (36.3 C) (Oral)  Ht 5\' 6"  (1.676 m)  Wt 170 lb (77.111 kg)  BMI 27.45 kg/m2 General:   Well oriented. No asterixis. Accompanied by spouse.  Skin:  Intact without significant lesions or rashes. Eyes:  Sclera clear, no icterus.   Conjunctiva pink. Ears:  Normal auditory acuity. Nose:  No deformity, discharge,  or lesions. Mouth:  No deformity or lesions. Neck:  Supple; no masses or thyromegaly. No significant cervical adenopathy. Chest. He may have 1+ gynecomastia bilaterally I do not appreciate any particular asymmetry or mass Lungs:  Clear throughout to auscultation.   No wheezes, crackles, or rhonchi. No acute distress. Heart:  Regular rate and rhythm; no murmurs, clicks, rubs,  or gallops. Abdomen: Non-distended, normal bowel sounds.  Soft and nontender without appreciable mass or hepatosplenomegaly.  Pulses:  Normal pulses noted. Extremities:  Without clubbing or edema.  Impression/Plan:  Pleasant 67 year old gentleman with Elita Boone cirrhosis recurrent hepatic encephalopathy. Fluid status improved with mild increase in diuretics and salt restriction. He does have mild bilateral gynecomastia. Aldactone therapy could be a  contributing factor. I'm glad he is going to [facility] to be evaluated. The Xifaxan rep just came through and has appropriated [place] a nice supply of free samples for this nice gentleman.    Recommendations:  Continue Lasix 40 mg in the morning; 20 mg in the evening  Continue Aldactone 50mg  in the  morning; 25 mg in the evening (this medication may be   causing some of your breast tenderness and may need to be stopped in the near future)  Continue Naldolol  See Dr. Huston Foley at [facility] as planned this week  Continue lactulose and Xifaxin  Further recommendations to follow

## 2012-06-15 DIAGNOSIS — Z951 Presence of aortocoronary bypass graft: Secondary | ICD-10-CM | POA: Insufficient documentation

## 2012-06-19 ENCOUNTER — Telehealth: Payer: Self-pay

## 2012-06-19 NOTE — Telephone Encounter (Signed)
OK 

## 2012-06-19 NOTE — Telephone Encounter (Signed)
Pt came by office requesting samples of xifaxan. Gave #6 boxes.

## 2012-06-27 ENCOUNTER — Encounter (HOSPITAL_COMMUNITY): Payer: Medicare Other | Attending: Oncology | Admitting: Oncology

## 2012-06-27 ENCOUNTER — Encounter (HOSPITAL_COMMUNITY): Payer: Self-pay | Admitting: Oncology

## 2012-06-27 VITALS — BP 124/69 | HR 66 | Temp 97.6°F | Resp 18 | Wt 172.2 lb

## 2012-06-27 DIAGNOSIS — K7460 Unspecified cirrhosis of liver: Secondary | ICD-10-CM

## 2012-06-27 DIAGNOSIS — D649 Anemia, unspecified: Secondary | ICD-10-CM

## 2012-06-27 DIAGNOSIS — D696 Thrombocytopenia, unspecified: Secondary | ICD-10-CM | POA: Insufficient documentation

## 2012-06-27 DIAGNOSIS — D638 Anemia in other chronic diseases classified elsewhere: Secondary | ICD-10-CM | POA: Insufficient documentation

## 2012-06-27 DIAGNOSIS — R161 Splenomegaly, not elsewhere classified: Secondary | ICD-10-CM

## 2012-06-27 LAB — IRON AND TIBC: Iron: 66 ug/dL (ref 42–135)

## 2012-06-27 LAB — CBC WITH DIFFERENTIAL/PLATELET
Basophils Relative: 0 % (ref 0–1)
Eosinophils Absolute: 0.2 10*3/uL (ref 0.0–0.7)
Hemoglobin: 11 g/dL — ABNORMAL LOW (ref 13.0–17.0)
MCH: 33.8 pg (ref 26.0–34.0)
MCHC: 36.2 g/dL — ABNORMAL HIGH (ref 30.0–36.0)
Monocytes Relative: 10 % (ref 3–12)
Neutrophils Relative %: 66 % (ref 43–77)
RDW: 14.3 % (ref 11.5–15.5)

## 2012-06-27 NOTE — Progress Notes (Signed)
Venipuncture performed by A. Nance.

## 2012-06-27 NOTE — Patient Instructions (Signed)
[facility] Discharge Instructions  RECOMMENDATIONS MADE BY THE CONSULTANT AND ANY TEST RESULTS WILL BE SENT TO YOUR REFERRING PHYSICIAN.  EXAM FINDINGS BY THE PHYSICIAN TODAY AND SIGNS OR SYMPTOMS TO REPORT TO CLINIC OR PRIMARY PHYSICIAN: Exam findings as discussed by Dr. Mariel Sleet.  SPECIAL INSTRUCTIONS/FOLLOW-UP: 1.  You had labs done today - we will contact you for any abnormal test results. 2.  Please keep your appointment to return to see [place] in the office in 3 months.  Call us sooner for any questions or concerns.  Thank you for choosing [facility] to provide your oncology and hematology care.  To afford each patient quality time with our providers, please arrive at least 15 minutes before your scheduled appointment time.  With your help, our goal is to use those 15 minutes to complete the necessary work-up to ensure our physicians have the information they need to help with your evaluation and healthcare recommendations.    Effective January 1st, 2014, we ask that you re-schedule your appointment with our physicians should you arrive 10 or more minutes late for your appointment.  We strive to give you quality time with our providers, and arriving late affects you and other patients whose appointments are after yours.    Again, thank you for choosing [facility].  Our hope is that these requests will decrease the amount of time that you wait before being seen by our physicians.       _____________________________________________________________  Should you have questions after your visit to [facility], please contact our office at [phone] between the hours of 8:30 a.m. and 5:00 p.m.  Voicemails left after 4:30 p.m. will not be returned until the following business day.  For prescription refill requests, have your pharmacy contact our office with your prescription refill request.

## 2012-06-27 NOTE — Addendum Note (Signed)
Addended by: Corena Herter D on: 06/27/2012 10:31 AM   Modules accepted: Orders

## 2012-06-27 NOTE — Progress Notes (Signed)
#  1 thrombocytopenia and anemia secondary to cirrhosis of the liver with secondary splenomegaly, no ascites and with recent hepatic encephalopathy. He is being evaluated at [facility] for possible transplantation. He is here today with his son, [name]. He is not having obvious blood in his stools or bleeding or throwing up blood. His legs show no petechiae. He has some old pigmentation changes on his legs but no acute or new petechiae.  His abdomen is still distended I suspect this is from ascites.  We will check his blood work today including iron studies to make sure he is not becoming iron deficient should he have subtle blood loss.  We will see him every 3 months going forward because of his change more recently with this a pack encephalopathy.  His 1 drug Xifaxan is so expensive he cannot afford it. They're trying to obtain samples for him but they last only a few days he states.  He had several questions about his liver disease and possible transplantation so we spent some significant time with counseling but if his other organs are in good shape I would think that he might be a very good candidate for transplant consideration.

## 2012-09-18 ENCOUNTER — Ambulatory Visit (INDEPENDENT_AMBULATORY_CARE_PROVIDER_SITE_OTHER): Payer: Medicare Other | Admitting: Internal Medicine

## 2012-09-18 ENCOUNTER — Encounter: Payer: Self-pay | Admitting: Internal Medicine

## 2012-09-18 VITALS — BP 101/59 | HR 60 | Temp 98.0°F | Ht 66.0 in | Wt 172.4 lb

## 2012-09-18 DIAGNOSIS — K7689 Other specified diseases of liver: Secondary | ICD-10-CM

## 2012-09-18 DIAGNOSIS — K7460 Unspecified cirrhosis of liver: Secondary | ICD-10-CM

## 2012-09-18 DIAGNOSIS — K7290 Hepatic failure, unspecified without coma: Secondary | ICD-10-CM

## 2012-09-18 DIAGNOSIS — N62 Hypertrophy of breast: Secondary | ICD-10-CM | POA: Insufficient documentation

## 2012-09-18 NOTE — Patient Instructions (Addendum)
Continue lasix daily  Decrease Spironolactone to 25 mg twice daily  BMET in 3 months / office visit 3 months

## 2012-09-18 NOTE — Progress Notes (Signed)
Primary Care Physician:  Catalina Pizza, MD Primary Gastroenterologist:  Dr. Jena Gauss  Pre-Procedure History & Physical: HPI:  [name] is a 67 y.o. male here for followup of Nash/wrists. He is being evaluated for transplant then at [facility]. He tells me has been listed. He is going to followup down there next week. We'll wait. Has a little breast tenderness and swelling. On Aldactone and Lasix. Adherent to a low sodium diet. He is up-to-date on hepatoma screening screening. Been vaccinated against hepatitis A and B. having 3-4 semi-formed stools daily on lactulose and Xifaxan. According to patient patient's wife no significant episodes of encephalopathy since his last visit. Yesterday about resumption of driving  Past Medical History  Diagnosis Date  . Anemia     thrombocytopenia  . History of transfusion of whole blood   . Cirrhosis of liver     NASH, vaccinated for HepA/B on 05/31/11 afp 1.6, thrombus in splenic vein per ct  . Coronary artery disease 2004    s/p CAGB  . Hyperlipidemia   . Hypothyroidism   . GERD (gastroesophageal reflux disease)   . Atherosclerosis   . Gynecomastia, male     RIGHT  . Thrombocytopenia   . DM (diabetes mellitus)   . Esophageal varices      EGD 05/13/10, three columns grade II to III EV s/p banding, second banding since 01/2010.>Portal gastropathy.. >5 banding sessions in the past.  . Spleen enlarged   . Thrombus     Chronic splenic, portal and smv  . Overactive bladder   . Erectile dysfunction     Past Surgical History  Procedure Laterality Date  . Esophagogastroduodenoscopy  01/2010    4 COLUMNS 2-3 GRADE ESOPHAGEAL VARICES,S/P BANDING,PORTAL GASTROPATHY  . Coronary artery bypass graft  2004  . Cardiac catheterization  10/09/06  . Colonoscopy  01/2010    portal colopathy, sigmoid AVM (nonbleeding)  . Esophagogastroduodenoscopy  June 2012    Dr. Lauris Keepers-->3 columns of grade 1 esophageal varices with overlying scar, no banding treatment required,  portal gastropathy. Next EGD in December 2013  . Esophagogastroduodenoscopy N/A 04/16/2012    Dr. Jena Gauss: 3 columns Grade 2-3 esophageal varices, portal gastropathy, gastric antral vascular ectasia    Prior to Admission medications   Medication Sig Start Date End Date Taking? Authorizing Provider  atorvastatin (LIPITOR) 10 MG tablet Take 10 mg by mouth every morning.    Yes Historical Provider, MD  furosemide (LASIX) 20 MG tablet Take 2 tablets in the morning and 1 tablet in the evening. 05/08/12  Yes Nira Retort, NP  insulin glargine (LANTUS) 100 UNIT/ML injection Inject 15 Units into the skin at bedtime.    Yes Historical Provider, MD  isosorbide mononitrate (IMDUR) 30 MG 24 hr tablet Take 30 mg by mouth every morning.    Yes Historical Provider, MD  lactulose (CHRONULAC) 10 GM/15ML solution Take 30 mLs (20 g total) by mouth 3 (three) times daily. 05/24/12  Yes Nira Retort, NP  levothyroxine (SYNTHROID, LEVOTHROID) 150 MCG tablet Take 150 mcg by mouth every morning.  09/09/11  Yes Historical Provider, MD  nadolol (CORGARD) 20 MG tablet Take 20 mg by mouth daily.   Yes Historical Provider, MD  NON FORMULARY Free - style strips 05/23/12  Yes Historical Provider, MD  rifaximin (XIFAXAN) 550 MG TABS Take 550 mg by mouth 2 (two) times daily.   Yes Historical Provider, MD  spironolactone (ALDACTONE) 25 MG tablet Take 2 tablets in the morning and 3 tablets in  the evening. 05/08/12  Yes Nira Retort, NP  triamcinolone lotion (KENALOG) 0.1 % Apply 1 application topically Twice daily. 04/06/12  Yes Historical Provider, MD    Allergies as of 09/18/2012  . (No Known Allergies)    Family History  Problem Relation Age of Onset  . Stroke Father 74  . Heart disease Mother 61    CHF  . Heart attack Brother   . Liver disease Neg Hx   . Colon cancer Neg Hx     History   Social History  . Marital Status: Married    Spouse Name: N/A    Number of Children: 4  . Years of Education: N/A   Occupational  History  .     Social History Main Topics  . Smoking status: Never Smoker   . Smokeless tobacco: Never Used  . Alcohol Use: 0.0 oz/week     Comment: occasionally  . Drug Use: No  . Sexually Active: Not Currently   Other Topics Concern  . Not on file   Social History Narrative  . No narrative on file    Review of Systems: See HPI, otherwise negative ROS  Physical Exam: BP 101/59  Pulse 60  Temp(Src) 98 F (36.7 C) (Oral)  Ht 5\' 6"  (1.676 m)  Wt 172 lb 6.4 oz (78.2 kg)  BMI 27.84 kg/m2 General:   Alert,  Well-developed, well-nourished, pleasant and cooperative in NAD. accompanied by wife.  No asterixis.. Skin:  Intact without significant lesions or rashes. Eyes:  Sclera clear, no icterus.   Conjunctiva pink. Ears:  Normal auditory acuity. Nose:  No deformity, discharge,  or lesions. Mouth:  No deformity or lesions. Neck:  Supple; no masses or thyromegaly. No significant cervical adenopathy. Lungs:  Clear throughout to auscultation.   No wheezes, crackles, or rhonchi. No acute distress.  He does have 1+ gynecomastia slightly tender breast. Heart:  Regular rate and rhythm; no murmurs, clicks, rubs,  or gallops. Abdomen: Non-distended, normal bowel sounds.  Soft and nontender without appreciable mass or hepatosplenomegaly. No shifting dullness or fluid wave. Pulses:  Normal pulses noted. Extremities:  Without clubbing or edema.  Impression/Plan:  Nash/cirrhosis. We'll compensated at this time. No clinically apparent encephalopathy. He asked about driving and gynecomastia likely secondary to Aldactone.  By report, listed for transplantation at [facility].   Recommendations: Decrease Aldactone to 25 mg orally twice daily. If gynecomastia does not settle down he is to let me know we'll stop this medication completely. Continue Lasix at current.  Do hepatoma screening October of this year. dosing. Continue  lactulose and Xifaxan. Frequent weighing at home. He gains 5 pounds over his  weight today. He is to let me know. Otherwise, we'll plan to see him back here in 3 months. Insidious nature of recurrent hepatic encephalopathy reviewed with patient and wife..  I continue to recommended he not drive or operate machinery and urged him to ask the doctors at [facility] regarding their opinion to his question.

## 2012-09-26 ENCOUNTER — Ambulatory Visit (HOSPITAL_COMMUNITY): Payer: Medicare Other | Admitting: Oncology

## 2012-10-04 ENCOUNTER — Other Ambulatory Visit: Payer: Self-pay

## 2012-10-04 DIAGNOSIS — K7460 Unspecified cirrhosis of liver: Secondary | ICD-10-CM

## 2012-11-12 ENCOUNTER — Other Ambulatory Visit: Payer: Self-pay

## 2012-11-12 MED ORDER — RIFAXIMIN 550 MG PO TABS: 550.0000 mg | ORAL_TABLET | Freq: Two times a day (BID) | ORAL | Status: DC

## 2012-11-26 ENCOUNTER — Other Ambulatory Visit: Payer: Self-pay

## 2012-11-26 DIAGNOSIS — K7460 Unspecified cirrhosis of liver: Secondary | ICD-10-CM

## 2012-12-04 ENCOUNTER — Telehealth: Payer: Self-pay | Admitting: Internal Medicine

## 2012-12-04 ENCOUNTER — Encounter: Payer: Self-pay | Admitting: Internal Medicine

## 2012-12-04 NOTE — Telephone Encounter (Signed)
Lab and letter have already been mailed to the pt.

## 2012-12-04 NOTE — Telephone Encounter (Signed)
Pt is on the OCT recall to follow up with RMR and I have mailed him appt card to come in on 10/24 at 0830 to see RMR. Recall has that he needs a BMET done also.

## 2012-12-20 ENCOUNTER — Telehealth: Payer: Self-pay

## 2012-12-20 NOTE — Telephone Encounter (Signed)
[name], please schedule U/S for this pt. He has appt with [facility]MR next Friday.

## 2012-12-20 NOTE — Telephone Encounter (Signed)
Message copied by Myra Rude on Thu Dec 20, 2012  1:48 PM ------      Message from: Corbin Ade      Created: Thu Dec 20, 2012  1:32 PM       That would be okay. He is due for six-month imaging      ----- Message -----         From: Evalee Mutton, LPN         Sent: 12/20/2012  11:36 AM           To: Corbin Ade, MD            Dr. Jena Gauss- Paul Dykes has an ov with you next Friday- (12/28/12) He is already scheduled for blood work this month but do you want him to have an U/S prior to his ov? He had an MRI done at [facility] in April 2014.       ------

## 2012-12-21 ENCOUNTER — Other Ambulatory Visit: Payer: Self-pay | Admitting: Internal Medicine

## 2012-12-21 DIAGNOSIS — K7460 Unspecified cirrhosis of liver: Secondary | ICD-10-CM

## 2012-12-21 NOTE — Telephone Encounter (Signed)
Abdominal U/S scheduled for Thurs Oct 23rd at 8:00 am and he is aware

## 2012-12-24 LAB — PROTIME-INR

## 2012-12-27 ENCOUNTER — Ambulatory Visit (HOSPITAL_COMMUNITY)
Admission: RE | Admit: 2012-12-27 | Discharge: 2012-12-27 | Disposition: A | Discharge status: Home / Self Care | Payer: Medicare Other | Source: Ambulatory Visit | Attending: Internal Medicine | Admitting: Internal Medicine

## 2012-12-27 DIAGNOSIS — R188 Other ascites: Secondary | ICD-10-CM | POA: Insufficient documentation

## 2012-12-27 DIAGNOSIS — K7460 Unspecified cirrhosis of liver: Secondary | ICD-10-CM | POA: Insufficient documentation

## 2012-12-27 DIAGNOSIS — K766 Portal hypertension: Secondary | ICD-10-CM | POA: Insufficient documentation

## 2012-12-27 DIAGNOSIS — K8020 Calculus of gallbladder without cholecystitis without obstruction: Secondary | ICD-10-CM | POA: Insufficient documentation

## 2012-12-27 DIAGNOSIS — R161 Splenomegaly, not elsewhere classified: Secondary | ICD-10-CM | POA: Insufficient documentation

## 2012-12-28 ENCOUNTER — Encounter: Payer: Self-pay | Admitting: Internal Medicine

## 2012-12-28 ENCOUNTER — Ambulatory Visit (INDEPENDENT_AMBULATORY_CARE_PROVIDER_SITE_OTHER): Payer: Medicare Other | Admitting: Internal Medicine

## 2012-12-28 ENCOUNTER — Encounter (INDEPENDENT_AMBULATORY_CARE_PROVIDER_SITE_OTHER): Payer: Self-pay

## 2012-12-28 VITALS — BP 102/54 | HR 78 | Temp 97.8°F | Wt 180.4 lb

## 2012-12-28 DIAGNOSIS — K7290 Hepatic failure, unspecified without coma: Secondary | ICD-10-CM

## 2012-12-28 DIAGNOSIS — K7460 Unspecified cirrhosis of liver: Secondary | ICD-10-CM

## 2012-12-28 NOTE — Patient Instructions (Signed)
Continue current medical regimen  Follow-through on recommendations for Hepatitis B vaccine  Office visit in 3 months

## 2012-12-28 NOTE — Progress Notes (Signed)
Primary Care Physician:  Catalina Pizza, MD Primary Gastroenterologist:  Dr. Jena Gauss  Pre-Procedure History & Physical: HPI:  [name] is a 67 y.o. male here for followup of Nash/cirrhosis. Patient tells me he's now on the transplant list at [facility]. Meld 22 calculated down there. He's gained a pound since he was here. Apparently, he was found not to be immune to hepatitis B although he received  vaccine. A repeat series has been ordered. Gynecomastia has improved with a lower dose of Aldactone. Continues on Lasix and naldolol. Stable MRI recently done down at [facility]; electrolytes were within normal limits ([facility] labs being scanned into our system)  Past Medical History  Diagnosis Date  . Anemia     thrombocytopenia  . History of transfusion of whole blood   . Cirrhosis of liver     NASH, vaccinated for HepA/B on 05/31/11 afp 1.6, thrombus in splenic vein per ct  . Coronary artery disease 2004    s/p CAGB  . Hyperlipidemia   . Hypothyroidism   . GERD (gastroesophageal reflux disease)   . Atherosclerosis   . Gynecomastia, male     RIGHT  . Thrombocytopenia   . DM (diabetes mellitus)   . Esophageal varices      EGD 05/13/10, three columns grade II to III EV s/p banding, second banding since 01/2010.>Portal gastropathy.. >5 banding sessions in the past.  . Spleen enlarged   . Thrombus     Chronic splenic, portal and smv  . Overactive bladder   . Erectile dysfunction     Past Surgical History  Procedure Laterality Date  . Esophagogastroduodenoscopy  01/2010    4 COLUMNS 2-3 GRADE ESOPHAGEAL VARICES,S/P BANDING,PORTAL GASTROPATHY  . Coronary artery bypass graft  2004  . Cardiac catheterization  10/09/06  . Colonoscopy  01/2010    portal colopathy, sigmoid AVM (nonbleeding)  . Esophagogastroduodenoscopy  June 2012    Dr. Latara Micheli-->3 columns of grade 1 esophageal varices with overlying scar, no banding treatment required, portal gastropathy. Next EGD in December 2013  .  Esophagogastroduodenoscopy N/A 04/16/2012    Dr. Jena Gauss: 3 columns Grade 2-3 esophageal varices, portal gastropathy, gastric antral vascular ectasia    Prior to Admission medications   Medication Sig Start Date End Date Taking? Authorizing Provider  atorvastatin (LIPITOR) 10 MG tablet Take 10 mg by mouth every morning.    Yes Historical Provider, MD  furosemide (LASIX) 20 MG tablet Take 2 tablets in the morning and 1 tablet in the evening. 05/08/12  Yes Nira Retort, NP  insulin glargine (LANTUS) 100 UNIT/ML injection Inject 15 Units into the skin at bedtime.    Yes Historical Provider, MD  isosorbide mononitrate (IMDUR) 30 MG 24 hr tablet Take 30 mg by mouth every morning.    Yes Historical Provider, MD  lactulose (CHRONULAC) 10 GM/15ML solution Take 30 mLs (20 g total) by mouth 3 (three) times daily. 05/24/12  Yes Nira Retort, NP  levothyroxine (SYNTHROID, LEVOTHROID) 150 MCG tablet Take 150 mcg by mouth every morning.  09/09/11  Yes Historical Provider, MD  nadolol (CORGARD) 20 MG tablet Take 20 mg by mouth daily.   Yes Historical Provider, MD  NON FORMULARY Free - style strips 05/23/12  Yes Historical Provider, MD  rifaximin (XIFAXAN) 550 MG TABS tablet Take 1 tablet (550 mg total) by mouth 2 (two) times daily. 11/12/12  Yes Tiffany Kocher, PA-C  spironolactone (ALDACTONE) 25 MG tablet Take 2 tablets in the morning and 3 tablets in the  evening. 05/08/12  Yes Nira Retort, NP  triamcinolone lotion (KENALOG) 0.1 % Apply 1 application topically Twice daily. 04/06/12  Yes Historical Provider, MD  warfarin (COUMADIN) 10 MG tablet Take 10 mg by mouth daily. 10 mg every morning then 10 mg on Tuesday and Thursday night   Yes Historical Provider, MD    Allergies as of 12/28/2012  . (No Known Allergies)    Family History  Problem Relation Age of Onset  . Stroke Father 58  . Heart disease Mother 27    CHF  . Heart attack Brother   . Liver disease Neg Hx   . Colon cancer Neg Hx     History   Social  History  . Marital Status: Married    Spouse Name: N/A    Number of Children: 4  . Years of Education: N/A   Occupational History  .     Social History Main Topics  . Smoking status: Never Smoker   . Smokeless tobacco: Never Used  . Alcohol Use: 0.0 oz/week     Comment: occasionally  . Drug Use: No  . Sexual Activity: Not Currently   Other Topics Concern  . Not on file   Social History Narrative  . No narrative on file    Review of Systems: See HPI, otherwise negative ROS  Physical Exam: BP 102/54  Pulse 78  Temp(Src) 97.8 F (36.6 C) (Oral)  Wt 180 lb 6.4 oz (81.829 kg)  BMI 29.13 kg/m2 General:   Alert,  Somewhat chronically ill gentleman, pleasant and cooperative in NAD.  Well oriented today. No flap.  Accompanied by wife. Skin:  Intact without significant lesions or rashes. Multiple tattoos Eyes:  Sclera clear, no icterus.   Conjunctiva pink. Ears:  Normal auditory acuity. Nose:  No deformity, discharge,  or lesions. Mouth:  No deformity or lesions. Neck:  Supple; no masses or thyromegaly. No significant cervical adenopathy. Lungs:  Clear throughout to auscultation.   No wheezes, crackles, or rhonchi. No acute distress. Heart:  Regular rate and rhythm; no murmurs, clicks, rubs,  or gallops. Abdomen: Full. Positive bowel sounds. Clinically, no tense ascites.  Without hepatosplenomegaly. Spleen is ballotable. Pulses:  Normal pulses noted. Extremities:  1+ lower extremity edema  Impression/Plan:  Nash/cirrhosis. Remains fairly well compensated at this time.    Reviewed importance of staying on his medical regimen including beta blockade, diuretics, Xifaxan and lactulose salt restriction reviewed.  I recommended he continue to follow through with recommendations made by the folks down at [facility]. He is to call if he has any interim problems. I hope he gets a new liver soon. Will plan to see him back in the office in 3 months.

## 2013-01-05 HISTORY — PX: LIVER TRANSPLANT: SHX410

## 2013-01-14 ENCOUNTER — Other Ambulatory Visit: Payer: Self-pay

## 2013-01-14 MED ORDER — SPIRONOLACTONE 25 MG PO TABS: ORAL_TABLET | ORAL | Status: DC

## 2013-01-14 NOTE — Telephone Encounter (Signed)
Not clear what dosage of Aldactone he should be taking. In the past, it was 25 mg po BID. This has something different. Let's find out what he is doing.

## 2013-01-14 NOTE — Telephone Encounter (Signed)
Pt take 1 tablet in the am and 2 tablets in the pm. The dose was changed on September 18, 2012

## 2013-01-14 NOTE — Telephone Encounter (Signed)
Done

## 2013-01-24 ENCOUNTER — Other Ambulatory Visit: Payer: Self-pay

## 2013-01-24 MED ORDER — LACTULOSE 10 GM/15ML PO SOLN: 20.0000 g | Freq: Three times a day (TID) | ORAL | Status: DC

## 2013-01-28 DIAGNOSIS — D849 Immunodeficiency, unspecified: Secondary | ICD-10-CM | POA: Insufficient documentation

## 2013-01-28 DIAGNOSIS — Z48298 Encounter for aftercare following other organ transplant: Secondary | ICD-10-CM | POA: Insufficient documentation

## 2013-01-29 ENCOUNTER — Telehealth: Payer: Self-pay | Admitting: Internal Medicine

## 2013-01-29 NOTE — Telephone Encounter (Signed)
Pt's wife called yesterday along with [facility] to let us know that Mr Parlee received a new liver on Saturday and is doing great!

## 2013-02-04 DIAGNOSIS — I81 Portal vein thrombosis: Secondary | ICD-10-CM | POA: Insufficient documentation

## 2013-02-11 DIAGNOSIS — S3011XA Contusion of abdominal wall, initial encounter: Secondary | ICD-10-CM | POA: Insufficient documentation

## 2013-02-11 DIAGNOSIS — S301XXA Contusion of abdominal wall, initial encounter: Secondary | ICD-10-CM | POA: Insufficient documentation

## 2013-03-18 ENCOUNTER — Encounter (INDEPENDENT_AMBULATORY_CARE_PROVIDER_SITE_OTHER): Payer: Self-pay

## 2013-03-19 ENCOUNTER — Encounter (INDEPENDENT_AMBULATORY_CARE_PROVIDER_SITE_OTHER): Payer: Self-pay

## 2013-03-19 ENCOUNTER — Ambulatory Visit (INDEPENDENT_AMBULATORY_CARE_PROVIDER_SITE_OTHER): Payer: Medicare Other | Admitting: Internal Medicine

## 2013-03-19 VITALS — BP 125/65 | HR 76 | Temp 97.8°F | Wt 162.0 lb

## 2013-03-19 DIAGNOSIS — Z944 Liver transplant status: Secondary | ICD-10-CM

## 2013-03-19 NOTE — Patient Instructions (Signed)
Continue follow-up protocol per [facility]'s recommendations  Office visit with me in 6 weeks  Call me with any problems

## 2013-03-19 NOTE — Progress Notes (Signed)
Primary Care Physician:  Delphina Cahill, MD Primary Gastroenterologist:  Dr. Gala Romney  Pre-Procedure History & Physical: HPI:  [name] is a 68 y.o. male here for followup. Status post orthotopic liver transplantation on November 22 of labs. The. Postop course, have a hematoma requiring evacuation and persisting drainage of a surgical site for which he has a wound VAC at this time. Also developed a right pleural effusion for which underwent thoracentesis. Tremor resolved after switching from Prograf to cyclosporin. [name] is progressing nicely. I'm seeing him for the first time today since his liver transplant. He does have one complaint-that of blood in his ejaculate after having sex with his wife for the first time following liver transplantation recently. He is unsure whether or not this is been an issue for him previously.  No prior urological problems. I do have a packet from [facility] containing medical records. Lab protocol on file with our  laboratory.  Past Medical History  Diagnosis Date  . Anemia     thrombocytopenia  . History of transfusion of whole blood   . Cirrhosis of liver     PT HAS HAD A LIVER TRANSPLANT-NASH, vaccinated for HepA/B on 05/31/11 afp 1.6, thrombus in splenic vein per ct  . Coronary artery disease 2004    s/p CAGB  . Hyperlipidemia   . Hypothyroidism   . GERD (gastroesophageal reflux disease)   . Atherosclerosis   . Gynecomastia, male     RIGHT  . Thrombocytopenia   . DM (diabetes mellitus)   . Esophageal varices      EGD 05/13/10, three columns grade II to III EV s/p banding, second banding since 01/2010.>Portal gastropathy.. >5 banding sessions in the past.  . Spleen enlarged   . Thrombus     Chronic splenic, portal and smv  . Overactive bladder   . Erectile dysfunction     Past Surgical History  Procedure Laterality Date  . Esophagogastroduodenoscopy  01/2010    4 COLUMNS 2-3 GRADE ESOPHAGEAL VARICES,S/P BANDING,PORTAL GASTROPATHY  . Coronary  artery bypass graft  2004  . Cardiac catheterization  10/09/06  . Colonoscopy  01/2010    portal colopathy, sigmoid AVM (nonbleeding)  . Esophagogastroduodenoscopy  June 2012    Dr. Jacey Pelc-->3 columns of grade 1 esophageal varices with overlying scar, no banding treatment required, portal gastropathy. Next EGD in December 2013  . Esophagogastroduodenoscopy N/A 04/16/2012    Dr. Gala Romney: 3 columns Grade 2-3 esophageal varices, portal gastropathy, gastric antral vascular ectasia  . Liver transplant  01/2013    Prior to Admission medications   Medication Sig Start Date End Date Taking? Authorizing Provider  aspirin 81 MG tablet Take 81 mg by mouth daily.   Yes Historical Provider, MD  atorvastatin (LIPITOR) 10 MG tablet Take 10 mg by mouth every morning.    Yes Historical Provider, MD  calcium carbonate (OS-CAL - DOSED IN MG OF ELEMENTAL CALCIUM) 1250 MG tablet Take 2 tablets by mouth 2 (two) times daily with a meal.   Yes Historical Provider, MD  clotrimazole (MYCELEX) 10 MG troche Take 10 mg by mouth 2 (two) times daily.   Yes Historical Provider, MD  CycloSPORINE Modified (GENGRAF PO) Take 125 mg by mouth 2 (two) times daily.   Yes Historical Provider, MD  furosemide (LASIX) 20 MG tablet Take 20 mg by mouth 2 (two) times daily. 05/08/12  Yes Orvil Feil, NP  insulin glargine (LANTUS) 100 UNIT/ML injection Inject 16 Units into the skin at bedtime.  Yes Historical Provider, MD  levothyroxine (SYNTHROID, LEVOTHROID) 150 MCG tablet Take 150 mcg by mouth every morning.  09/09/11  Yes Historical Provider, MD  Magnesium Oxide (MAGOX 400 PO) Take 1,200 mg by mouth 2 (two) times daily.   Yes Historical Provider, MD  Mycophenolate Mofetil (CELLCEPT PO) Take 1,000 mg by mouth daily.   Yes Historical Provider, MD  omeprazole (PRILOSEC) 20 MG capsule Take 20 mg by mouth daily.   Yes Historical Provider, MD  oxycodone (OXY-IR) 5 MG capsule Take 5 mg by mouth every 4 (four) hours as needed.   Yes Historical  Provider, MD  predniSONE (STERAPRED UNI-PAK) 10 MG tablet Take 17.5 tablets by mouth daily.   Yes Historical Provider, MD  sulfamethoxazole-trimethoprim (BACTRIM DS) 800-160 MG per tablet Take 1 tablet by mouth 3 (three) times a week.   Yes Historical Provider, MD  ValGANciclovir HCl (VALCYTE PO) Take 900 mg by mouth 2 (two) times daily.   Yes Historical Provider, MD  isosorbide mononitrate (IMDUR) 30 MG 24 hr tablet Take 30 mg by mouth every morning.     Historical Provider, MD  lactulose (CHRONULAC) 10 GM/15ML solution Take 30 mLs (20 g total) by mouth 3 (three) times daily. 01/24/13   Mahala Menghini, PA-C  nadolol (CORGARD) 20 MG tablet Take 20 mg by mouth daily.    Historical Provider, MD  NON FORMULARY Free - style strips 05/23/12   Historical Provider, MD  rifaximin (XIFAXAN) 550 MG TABS tablet Take 1 tablet (550 mg total) by mouth 2 (two) times daily. 11/12/12   Mahala Menghini, PA-C  spironolactone (ALDACTONE) 25 MG tablet Take 1 tablet in the morning and 2 tablets in the evening. 01/14/13   Orvil Feil, NP  triamcinolone lotion (KENALOG) 0.1 % Apply 1 application topically Twice daily. 04/06/12   Historical Provider, MD  warfarin (COUMADIN) 10 MG tablet Take 10 mg by mouth daily. 10 mg every morning then 10 mg on Tuesday and Thursday night    Historical Provider, MD    Allergies as of 03/19/2013  . (No Known Allergies)    Family History  Problem Relation Age of Onset  . Stroke Father 73  . Heart disease Mother 20    CHF  . Heart attack Brother   . Liver disease Neg Hx   . Colon cancer Neg Hx     History   Social History  . Marital Status: Married    Spouse Name: N/A    Number of Children: 4  . Years of Education: N/A   Occupational History  .     Social History Main Topics  . Smoking status: Never Smoker   . Smokeless tobacco: Never Used  . Alcohol Use: 0.0 oz/week     Comment: occasionally  . Drug Use: No  . Sexual Activity: Not Currently   Other Topics Concern  .  Not on file   Social History Narrative  . No narrative on file    Review of Systems: See HPI, otherwise negative ROS  Physical Exam: BP 125/65  Pulse 76  Temp(Src) 97.8 F (36.6 C) (Oral)  Wt 162 lb (73.483 kg) General:   Somewhat weak-appearing gentleman who is more animated and alert since I previously saw him pretransplant.  Skin:  Intact without significant lesions or rashes. Eyes:  Sclera clear, no icterus.   Conjunctiva pink. Ears:  Normal auditory acuity. Nose:  No deformity, discharge,  or lesions. Mouth:  No deformity or lesions. Neck:  Supple; no  masses or thyromegaly. No significant cervical adenopathy. Lungs:  Clear throughout to auscultation.   No wheezes, crackles, or rhonchi. No acute distress. Heart:  Regular rate and rhythm; no murmurs, clicks, rubs,  or gallops. Abdomen: Non-distended, normal bowel sounds.  Transplant surgical site healing well; he has a wound VAC catheter in place.  Abdomen is soft and nontender around the wound.  Pulses:  Normal pulses noted. Extremities:  Without clubbing or edema.  Impression:   Pleasant 68 year old gentleman status post orthotopic liver transplant secondary to [facility] cirrhosis. He's doing well at this point postoperatively although he did have somewhat of a bumpy post operative course. Reviewed the transplant followup protocol with him. Reviewed the importance of followup,  getting frequent weights,  vital signs and watching for signs of infection such as fever, etc. Very important to get labs per [facility] protocol.  He complains of blood in his ejaculate just after having major transplant surgery. He is anticoagulated. He may benefit from seeing a urologist. However, I told [name] to let the folks down at [facility] about his concern and see what they think. It would be best for him to see a urologist down there if he were to need that service.  Unless something comes up, we'll plan to see him back in 6 weeks

## 2013-04-29 ENCOUNTER — Encounter: Payer: Self-pay | Admitting: Internal Medicine

## 2013-04-29 ENCOUNTER — Ambulatory Visit (INDEPENDENT_AMBULATORY_CARE_PROVIDER_SITE_OTHER): Payer: Medicare Other | Admitting: Internal Medicine

## 2013-04-29 VITALS — BP 144/79 | HR 86 | Temp 97.6°F | Wt 161.8 lb

## 2013-04-29 DIAGNOSIS — Z944 Liver transplant status: Secondary | ICD-10-CM

## 2013-04-29 NOTE — Progress Notes (Deleted)
Primary Care Physician:  Delphina Cahill, MD Primary Gastroenterologist:  Dr.   Pre-Procedure History & Physical: HPI:  [name] is a 68 y.o. male here for   Past Medical History  Diagnosis Date  . Anemia     thrombocytopenia  . History of transfusion of whole blood   . Cirrhosis of liver     PT HAS HAD A LIVER TRANSPLANT-NASH, vaccinated for HepA/B on 05/31/11 afp 1.6, thrombus in splenic vein per ct  . Coronary artery disease 2004    s/p CAGB  . Hyperlipidemia   . Hypothyroidism   . GERD (gastroesophageal reflux disease)   . Atherosclerosis   . Gynecomastia, male     RIGHT  . Thrombocytopenia   . DM (diabetes mellitus)   . Esophageal varices      EGD 05/13/10, three columns grade II to III EV s/p banding, second banding since 01/2010.>Portal gastropathy.. >5 banding sessions in the past.  . Spleen enlarged   . Thrombus     Chronic splenic, portal and smv  . Overactive bladder   . Erectile dysfunction     Past Surgical History  Procedure Laterality Date  . Esophagogastroduodenoscopy  01/2010    4 COLUMNS 2-3 GRADE ESOPHAGEAL VARICES,S/P BANDING,PORTAL GASTROPATHY  . Coronary artery bypass graft  2004  . Cardiac catheterization  10/09/06  . Colonoscopy  01/2010    portal colopathy, sigmoid AVM (nonbleeding)  . Esophagogastroduodenoscopy  June 2012    Dr. Maleeah Crossman-->3 columns of grade 1 esophageal varices with overlying scar, no banding treatment required, portal gastropathy. Next EGD in December 2013  . Esophagogastroduodenoscopy N/A 04/16/2012    Dr. Gala Romney: 3 columns Grade 2-3 esophageal varices, portal gastropathy, gastric antral vascular ectasia  . Liver transplant  01/2013    Prior to Admission medications   Medication Sig Start Date End Date Taking? Authorizing Provider  amoxicillin-clavulanate (AUGMENTIN) 875-125 MG per tablet Take 1 tablet by mouth 2 (two) times daily.  04/26/13  Yes Historical Provider, MD  aspirin 81 MG tablet Take 81 mg by mouth daily.   Yes  Historical Provider, MD  atorvastatin (LIPITOR) 10 MG tablet Take 10 mg by mouth every morning.    Yes Historical Provider, MD  calcium carbonate (OS-CAL - DOSED IN MG OF ELEMENTAL CALCIUM) 1250 MG tablet Take 2 tablets by mouth 2 (two) times daily with a meal.   Yes Historical Provider, MD  clotrimazole (MYCELEX) 10 MG troche Take 10 mg by mouth 2 (two) times daily.   Yes Historical Provider, MD  CycloSPORINE Modified (GENGRAF PO) Take 125 mg by mouth 2 (two) times daily.   Yes Historical Provider, MD  furosemide (LASIX) 20 MG tablet Take 20 mg by mouth 2 (two) times daily. 05/08/12  Yes Orvil Feil, NP  HUMALOG KWIKPEN 100 UNIT/ML KiwkPen  04/24/13  Yes Historical Provider, MD  insulin glargine (LANTUS) 100 UNIT/ML injection Inject 16 Units into the skin at bedtime.    Yes Historical Provider, MD  isosorbide mononitrate (IMDUR) 30 MG 24 hr tablet Take 30 mg by mouth every morning.    Yes Historical Provider, MD  lactulose (CHRONULAC) 10 GM/15ML solution Take 30 mLs (20 g total) by mouth 3 (three) times daily. 01/24/13  Yes Mahala Menghini, PA-C  levothyroxine (SYNTHROID, LEVOTHROID) 150 MCG tablet Take 150 mcg by mouth every morning.  09/09/11  Yes Historical Provider, MD  Magnesium Oxide (MAGOX 400 PO) Take 1,200 mg by mouth 2 (two) times daily.   Yes Historical Provider, MD  Mycophenolate Mofetil (CELLCEPT PO) Take 1,000 mg by mouth 2 (two) times daily.    Yes Historical Provider, MD  NON FORMULARY Free - style strips 05/23/12  Yes Historical Provider, MD  omeprazole (PRILOSEC) 20 MG capsule Take 20 mg by mouth daily.   Yes Historical Provider, MD  oxycodone (OXY-IR) 5 MG capsule Take 5 mg by mouth every 4 (four) hours as needed.   Yes Historical Provider, MD  predniSONE (DELTASONE) 5 MG tablet Take 10 mg by mouth daily with breakfast.  04/21/13  Yes Historical Provider, MD  senna (SENOKOT) 8.6 MG tablet Take 1 tablet by mouth daily.   Yes Historical Provider, MD  ValGANciclovir HCl (VALCYTE PO) Take  900 mg by mouth 2 (two) times daily.   Yes Historical Provider, MD    Allergies as of 04/29/2013  . (No Known Allergies)    Family History  Problem Relation Age of Onset  . Stroke Father 97  . Heart disease Mother 78    CHF  . Heart attack Brother   . Liver disease Neg Hx   . Colon cancer Neg Hx     History   Social History  . Marital Status: Married    Spouse Name: N/A    Number of Children: 4  . Years of Education: N/A   Occupational History  .     Social History Main Topics  . Smoking status: Never Smoker   . Smokeless tobacco: Never Used  . Alcohol Use: 0.0 oz/week     Comment: occasionally  . Drug Use: No  . Sexual Activity: Not Currently   Other Topics Concern  . Not on file   Social History Narrative  . No narrative on file    Review of Systems: See HPI, otherwise negative ROS  Physical Exam: BP 144/79  Pulse 86  Temp(Src) 97.6 F (36.4 C) (Oral)  Wt 161 lb 12.8 oz (73.392 kg) General:   Alert,  Well-developed, well-nourished, pleasant and cooperative in NAD Skin:  Intact without significant lesions or rashes. Eyes:  Sclera clear, no icterus.   Conjunctiva pink. Ears:  Normal auditory acuity. Nose:  No deformity, discharge,  or lesions. Mouth:  No deformity or lesions. Neck:  Supple; no masses or thyromegaly. No significant cervical adenopathy. Lungs:  Clear throughout to auscultation.   No wheezes, crackles, or rhonchi. No acute distress. Heart:  Regular rate and rhythm; no murmurs, clicks, rubs,  or gallops. Abdomen: Non-distended, normal bowel sounds.  Soft and nontender without appreciable mass or hepatosplenomegaly.  Pulses:  Normal pulses noted. Extremities:  Without clubbing or edema.  Impression/Plan:  ***

## 2013-04-29 NOTE — Progress Notes (Signed)
Primary Care Physician:  Delphina Cahill, MD Primary Gastroenterologist:  Dr. Nevada Crane  Pre-Procedure History & Physical: HPI:  [name] is a 68 y.o. male here for followup your status post orthotopic liver transplant back in November of last year. He has done well; he had a rectus sheath hematoma which was drained. A wound VAC in place up until about 2 months ago. Also had a portal vein thrombosis which was removed at the time of transplant. He has also come off of Coumadin. His LFTs have been normal. He seeing the folks at [facility] next week. I suspect some his medications will be tapered off at that time. He's getting labs drawn today.  Past Medical History  Diagnosis Date  . Anemia     thrombocytopenia  . History of transfusion of whole blood   . Cirrhosis of liver     PT HAS HAD A LIVER TRANSPLANT-NASH, vaccinated for HepA/B on 05/31/11 afp 1.6, thrombus in splenic vein per ct  . Coronary artery disease 2004    s/p CAGB  . Hyperlipidemia   . Hypothyroidism   . GERD (gastroesophageal reflux disease)   . Atherosclerosis   . Gynecomastia, male     RIGHT  . Thrombocytopenia   . DM (diabetes mellitus)   . Esophageal varices      EGD 05/13/10, three columns grade II to III EV s/p banding, second banding since 01/2010.>Portal gastropathy.. >5 banding sessions in the past.  . Spleen enlarged   . Thrombus     Chronic splenic, portal and smv  . Overactive bladder   . Erectile dysfunction     Past Surgical History  Procedure Laterality Date  . Esophagogastroduodenoscopy  01/2010    4 COLUMNS 2-3 GRADE ESOPHAGEAL VARICES,S/P BANDING,PORTAL GASTROPATHY  . Coronary artery bypass graft  2004  . Cardiac catheterization  10/09/06  . Colonoscopy  01/2010    portal colopathy, sigmoid AVM (nonbleeding)  . Esophagogastroduodenoscopy  June 2012    Dr. Jaison Petraglia-->3 columns of grade 1 esophageal varices with overlying scar, no banding treatment required, portal gastropathy. Next EGD in December 2013  .  Esophagogastroduodenoscopy N/A 04/16/2012    Dr. Gala Romney: 3 columns Grade 2-3 esophageal varices, portal gastropathy, gastric antral vascular ectasia  . Liver transplant  01/2013    Prior to Admission medications   Medication Sig Start Date End Date Taking? Authorizing Provider  amoxicillin-clavulanate (AUGMENTIN) 875-125 MG per tablet Take 1 tablet by mouth 2 (two) times daily.  04/26/13  Yes Historical Provider, MD  aspirin 81 MG tablet Take 81 mg by mouth daily.   Yes Historical Provider, MD  atorvastatin (LIPITOR) 10 MG tablet Take 10 mg by mouth every morning.    Yes Historical Provider, MD  calcium carbonate (OS-CAL - DOSED IN MG OF ELEMENTAL CALCIUM) 1250 MG tablet Take 2 tablets by mouth 2 (two) times daily with a meal.   Yes Historical Provider, MD  clotrimazole (MYCELEX) 10 MG troche Take 10 mg by mouth 2 (two) times daily.   Yes Historical Provider, MD  CycloSPORINE Modified (GENGRAF PO) Take 125 mg by mouth 2 (two) times daily.   Yes Historical Provider, MD  furosemide (LASIX) 20 MG tablet Take 20 mg by mouth 2 (two) times daily. 05/08/12  Yes Orvil Feil, NP  HUMALOG KWIKPEN 100 UNIT/ML KiwkPen  04/24/13  Yes Historical Provider, MD  insulin glargine (LANTUS) 100 UNIT/ML injection Inject 16 Units into the skin at bedtime.    Yes Historical Provider, MD  isosorbide mononitrate (  IMDUR) 30 MG 24 hr tablet Take 30 mg by mouth every morning.    Yes Historical Provider, MD  lactulose (CHRONULAC) 10 GM/15ML solution Take 30 mLs (20 g total) by mouth 3 (three) times daily. 01/24/13  Yes Mahala Menghini, PA-C  levothyroxine (SYNTHROID, LEVOTHROID) 150 MCG tablet Take 150 mcg by mouth every morning.  09/09/11  Yes Historical Provider, MD  Magnesium Oxide (MAGOX 400 PO) Take 1,200 mg by mouth 2 (two) times daily.   Yes Historical Provider, MD  Mycophenolate Mofetil (CELLCEPT PO) Take 1,000 mg by mouth 2 (two) times daily.    Yes Historical Provider, MD  NON FORMULARY Free - style strips 05/23/12  Yes  Historical Provider, MD  omeprazole (PRILOSEC) 20 MG capsule Take 20 mg by mouth daily.   Yes Historical Provider, MD  oxycodone (OXY-IR) 5 MG capsule Take 5 mg by mouth every 4 (four) hours as needed.   Yes Historical Provider, MD  predniSONE (DELTASONE) 5 MG tablet Take 10 mg by mouth daily with breakfast.  04/21/13  Yes Historical Provider, MD  senna (SENOKOT) 8.6 MG tablet Take 1 tablet by mouth daily.   Yes Historical Provider, MD  ValGANciclovir HCl (VALCYTE PO) Take 900 mg by mouth 2 (two) times daily.   Yes Historical Provider, MD    Allergies as of 04/29/2013  . (No Known Allergies)    Family History  Problem Relation Age of Onset  . Stroke Father 36  . Heart disease Mother 61    CHF  . Heart attack Brother   . Liver disease Neg Hx   . Colon cancer Neg Hx     History   Social History  . Marital Status: Married    Spouse Name: N/A    Number of Children: 4  . Years of Education: N/A   Occupational History  .     Social History Main Topics  . Smoking status: Never Smoker   . Smokeless tobacco: Never Used  . Alcohol Use: 0.0 oz/week     Comment: occasionally  . Drug Use: No  . Sexual Activity: Not Currently   Other Topics Concern  . Not on file   Social History Narrative  . No narrative on file    Review of Systems: See HPI, otherwise negative ROS  Physical Exam: BP 144/79  Pulse 86  Temp(Src) 97.6 F (36.4 C) (Oral)  Wt 161 lb 12.8 oz (73.392 kg) General:   Alert,   pleasant and cooperative in NAD Skin:  Intact without significant lesions or rashes. Eyes:  Sclera clear, no icterus.   Conjunctiva pink. Ears:  Normal auditory acuity. Nose:  No deformity, discharge,  or lesions. Mouth:  No deformity or lesions. Neck:  Supple; no masses or thyromegaly. No significant cervical adenopathy. Lungs:  Clear throughout to auscultation.   No wheezes, crackles, or rhonchi. No acute distress. Heart:  Regular rate and rhythm; no murmurs, clicks, rubs,  or  gallops. Abdomen: Well-healing transplant scars. Non-distended, normal bowel sounds.  Dressing in place left upper quadrant. Appears clean and dry. Abdomen is soft and nontender.   Pulses:  Normal pulses noted. Extremities:  Without clubbing or edema.  Impression/Plan:     Patient is a 68 year old gentleman doing well status post orthotopic liver transplant-now 3 months out. LFTs have been normal. He is progressing nicely.             I told him that I plan to see him back in 3 months. In the interim,  he should continue to follow the recommendations of the [facility] folks closely. I'll be available to see him any time                                             should issues arise.

## 2013-04-29 NOTE — Patient Instructions (Signed)
Continue to follow-up with [facility]  Office visit here in 3 months  Keep follow-up appointments with Dr. Nevada Crane

## 2013-05-27 DIAGNOSIS — R361 Hematospermia: Secondary | ICD-10-CM | POA: Insufficient documentation

## 2013-07-02 DIAGNOSIS — N183 Chronic kidney disease, stage 3 unspecified: Secondary | ICD-10-CM | POA: Insufficient documentation

## 2013-07-22 ENCOUNTER — Encounter: Payer: Self-pay | Admitting: Internal Medicine

## 2013-07-30 IMAGING — CR DG CHEST 2V
2 series · 2 of 2 positions shown · non-contrast
Comparison: May 24, 2005.

CLINICAL DATA: Cirrhosis and ascites.

CHEST - 2 VIEW

[view not recorded (1 of 2)]
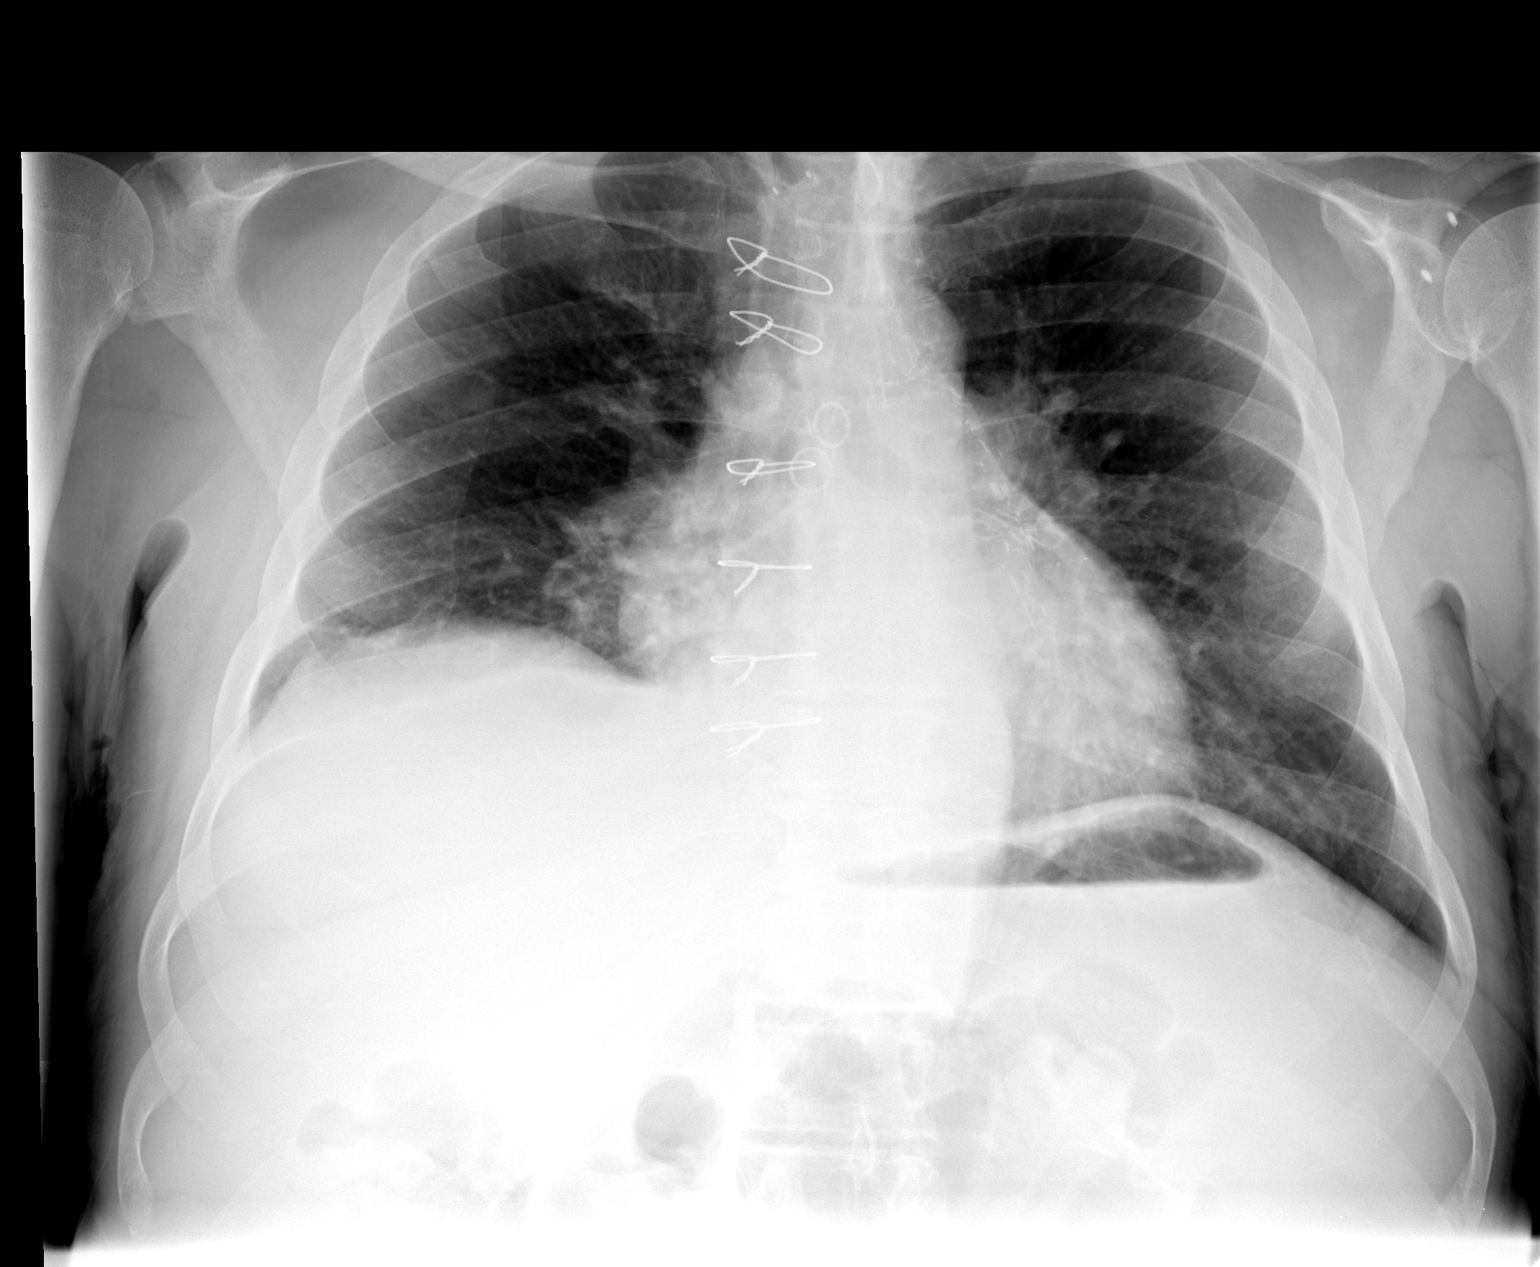

[view not recorded (2 of 2)]
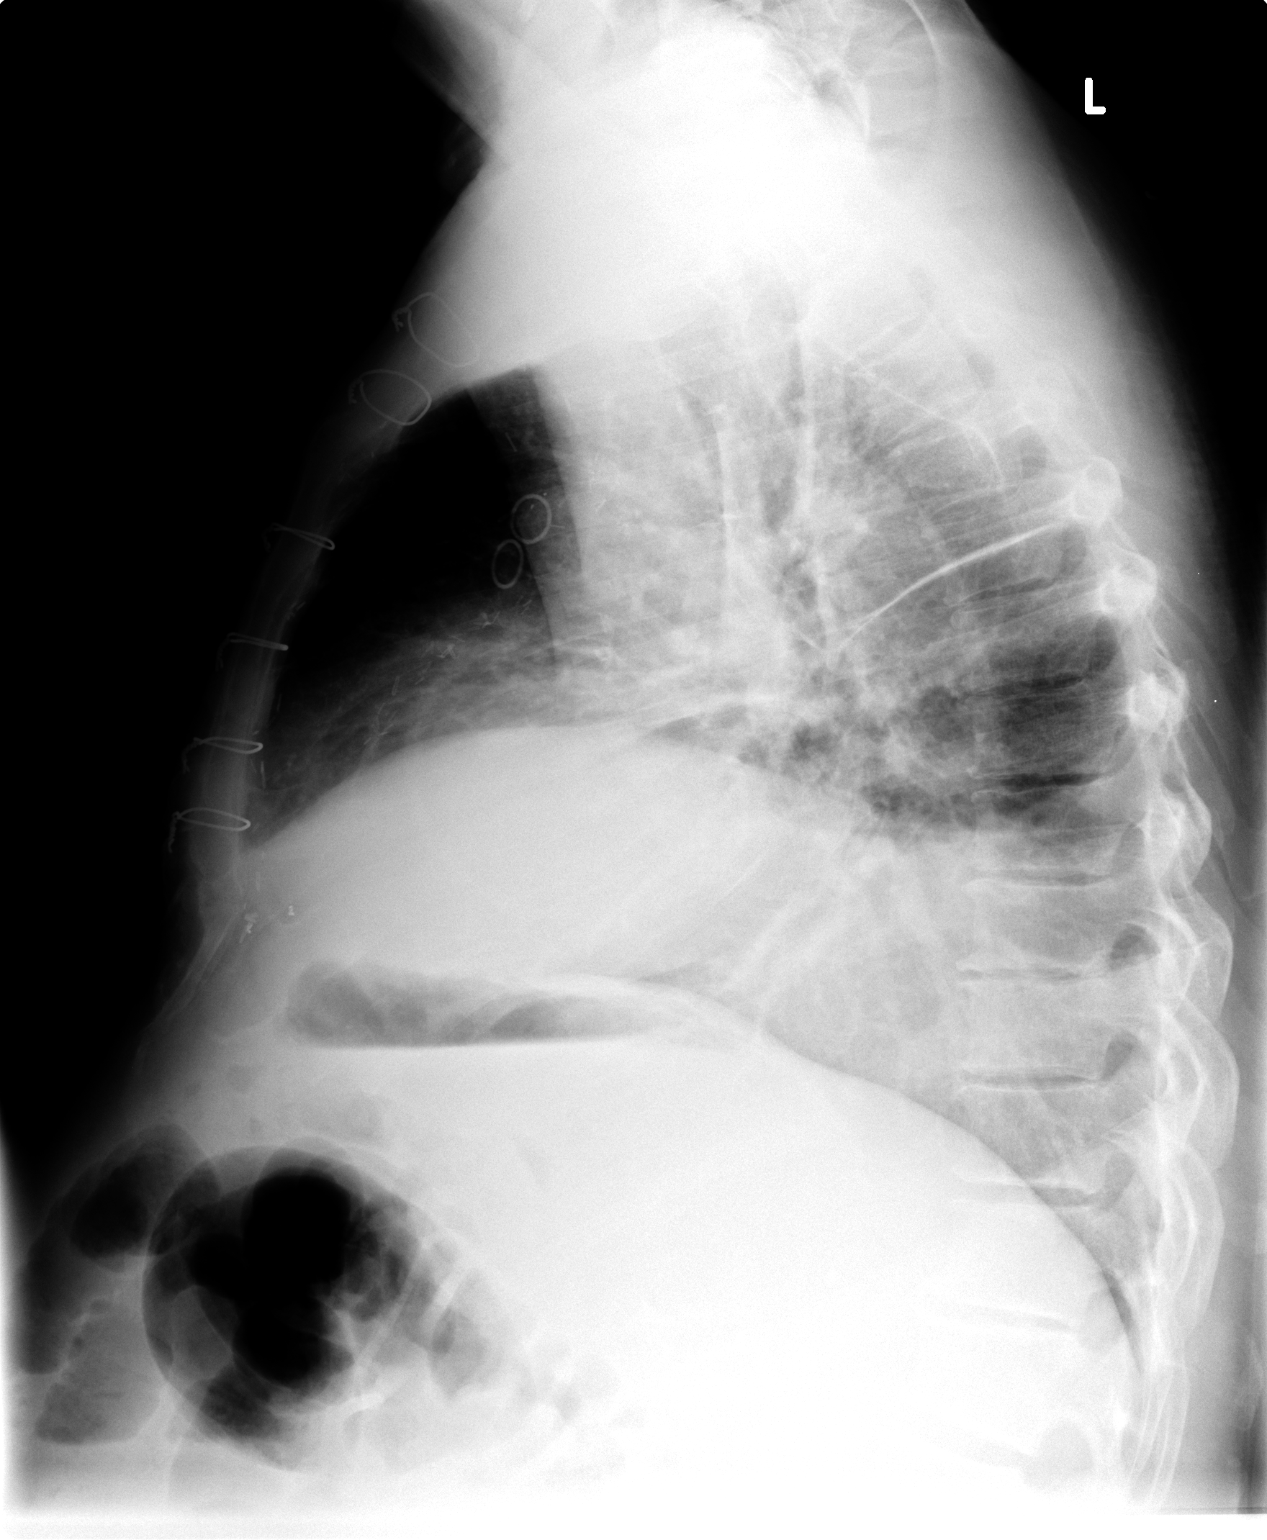

[2 of 2 positions shown; findings below may reference images not displayed]

FINDINGS: Sternotomy wires are noted.  Elevated right hemidiaphragm
is again noted.  Bony thorax is intact.  No acute pulmonary disease
is noted.  No definite effusion or pneumothorax is noted.
IMPRESSION: No acute cardiopulmonary abnormality seen.

## 2013-08-01 ENCOUNTER — Other Ambulatory Visit (HOSPITAL_COMMUNITY): Payer: Self-pay

## 2013-08-01 DIAGNOSIS — R0602 Shortness of breath: Secondary | ICD-10-CM

## 2013-08-08 ENCOUNTER — Ambulatory Visit (HOSPITAL_COMMUNITY)
Admission: RE | Admit: 2013-08-08 | Discharge: 2013-08-08 | Disposition: A | Discharge status: Home / Self Care | Payer: Medicare Other | Source: Ambulatory Visit | Attending: Internal Medicine | Admitting: Internal Medicine

## 2013-08-08 DIAGNOSIS — R0602 Shortness of breath: Secondary | ICD-10-CM | POA: Insufficient documentation

## 2013-08-08 MED ORDER — ALBUTEROL SULFATE (2.5 MG/3ML) 0.083% IN NEBU
2.5000 mg | INHALATION_SOLUTION | Freq: Once | RESPIRATORY_TRACT | Status: AC
  Administered 2013-08-08: 2.5 mg via RESPIRATORY_TRACT

## 2013-08-19 LAB — PULMONARY FUNCTION TEST
DL/VA % pred: 63 %
DL/VA: 2.81 ml/min/mmHg/L
DLCO COR % PRED: 34 %
DLCO UNC: 9.41 ml/min/mmHg
DLCO cor: 9.75 ml/min/mmHg
DLCO unc % pred: 33 %
FEF 25-75 Post: 2.58 L/sec
FEF 25-75 Pre: 2.33 L/sec
FEF2575-%CHANGE-POST: 10 %
FEF2575-%PRED-PRE: 101 %
FEF2575-%Pred-Post: 112 %
FEV1-%Change-Post: 2 %
FEV1-%PRED-POST: 63 %
FEV1-%PRED-PRE: 62 %
FEV1-Post: 1.88 L
FEV1-Pre: 1.84 L
FEV1FVC-%CHANGE-POST: 6 %
FEV1FVC-%Pred-Pre: 113 %
FEV6-%Change-Post: -3 %
FEV6-%PRED-POST: 56 %
FEV6-%Pred-Pre: 58 %
FEV6-PRE: 2.2 L
FEV6-Post: 2.11 L
FEV6FVC-%PRED-PRE: 106 %
FEV6FVC-%Pred-Post: 106 %
FVC-%Change-Post: -3 %
FVC-%PRED-PRE: 54 %
FVC-%Pred-Post: 52 %
FVC-POST: 2.11 L
FVC-Pre: 2.2 L
POST FEV6/FVC RATIO: 100 %
Post FEV1/FVC ratio: 89 %
Pre FEV1/FVC ratio: 84 %
Pre FEV6/FVC Ratio: 100 %
RV % PRED: 70 %
RV: 1.59 L
TLC % PRED: 66 %
TLC: 4.25 L

## 2013-08-20 ENCOUNTER — Encounter: Payer: Self-pay | Admitting: Internal Medicine

## 2013-08-20 ENCOUNTER — Ambulatory Visit (INDEPENDENT_AMBULATORY_CARE_PROVIDER_SITE_OTHER): Payer: Medicare Other | Admitting: Internal Medicine

## 2013-08-20 VITALS — BP 110/64 | HR 80 | Temp 97.1°F | Ht 67.0 in | Wt 163.8 lb

## 2013-08-20 DIAGNOSIS — K219 Gastro-esophageal reflux disease without esophagitis: Secondary | ICD-10-CM

## 2013-08-20 DIAGNOSIS — Z944 Liver transplant status: Secondary | ICD-10-CM

## 2013-08-20 DIAGNOSIS — L02219 Cutaneous abscess of trunk, unspecified: Secondary | ICD-10-CM

## 2013-08-20 DIAGNOSIS — L03319 Cellulitis of trunk, unspecified: Secondary | ICD-10-CM

## 2013-08-20 DIAGNOSIS — L02211 Cutaneous abscess of abdominal wall: Secondary | ICD-10-CM

## 2013-08-20 NOTE — Patient Instructions (Signed)
Will need to see about abdominal wall issue. Will contact the transplant coordinator  Further recommendations to follow  Office visit in 3 months

## 2013-08-20 NOTE — Progress Notes (Signed)
Primary Care Physician:  Delphina Cahill, MD Primary Gastroenterologist:  Dr. Gala Romney  Pre-Procedure History & Physical: HPI:  [name] is a 68 y.o. male here for followup. Status post orthotopic liver transplant. Had a URI recently treated with Z-Pak. Doing better. Labs reportedly load good there being drawn here and sent to do I'm not getting any copies, however. GERD symptoms well controlled on Prilosec.  Has a knot on his suture line. It's getting bigger. It is tender. He has not had a fever recently.  Past Medical History  Diagnosis Date  . Anemia     thrombocytopenia  . History of transfusion of whole blood   . Cirrhosis of liver     PT HAS HAD A LIVER TRANSPLANT-NASH, vaccinated for HepA/B on 05/31/11 afp 1.6, thrombus in splenic vein per ct  . Coronary artery disease 2004    s/p CAGB  . Hyperlipidemia   . Hypothyroidism   . GERD (gastroesophageal reflux disease)   . Atherosclerosis   . Gynecomastia, male     RIGHT  . Thrombocytopenia   . DM (diabetes mellitus)   . Esophageal varices      EGD 05/13/10, three columns grade II to III EV s/p banding, second banding since 01/2010.>Portal gastropathy.. >5 banding sessions in the past.  . Spleen enlarged   . Thrombus     Chronic splenic, portal and smv  . Overactive bladder   . Erectile dysfunction     Past Surgical History  Procedure Laterality Date  . Esophagogastroduodenoscopy  01/2010    4 COLUMNS 2-3 GRADE ESOPHAGEAL VARICES,S/P BANDING,PORTAL GASTROPATHY  . Coronary artery bypass graft  2004  . Cardiac catheterization  10/09/06  . Colonoscopy  01/2010    portal colopathy, sigmoid AVM (nonbleeding)  . Esophagogastroduodenoscopy  June 2012    Dr. Rourk-->3 columns of grade 1 esophageal varices with overlying scar, no banding treatment required, portal gastropathy. Next EGD in December 2013  . Esophagogastroduodenoscopy N/A 04/16/2012    Dr. Gala Romney: 3 columns Grade 2-3 esophageal varices, portal gastropathy, gastric  antral vascular ectasia  . Liver transplant  01/2013    Prior to Admission medications   Medication Sig Start Date End Date Taking? Authorizing Provider  aspirin 81 MG tablet Take 81 mg by mouth daily.   Yes Historical Provider, MD  atorvastatin (LIPITOR) 10 MG tablet Take 10 mg by mouth every morning.    Yes Historical Provider, MD  azithromycin (ZITHROMAX) 250 MG tablet Take 250 mg by mouth daily.   Yes Historical Provider, MD  calcium carbonate (OS-CAL - DOSED IN MG OF ELEMENTAL CALCIUM) 1250 MG tablet Take 2 tablets by mouth 2 (two) times daily with a meal.   Yes Historical Provider, MD  carvedilol (COREG) 3.125 MG tablet Take 3.125 mg by mouth at bedtime.   Yes Historical Provider, MD  CycloSPORINE Modified (GENGRAF PO) Take 150 mg by mouth 2 (two) times daily.    Yes Historical Provider, MD  furosemide (LASIX) 20 MG tablet Take 20 mg by mouth 2 (two) times daily. Takes one tablet every other day 05/08/12  Yes Orvil Feil, NP  insulin glargine (LANTUS) 100 UNIT/ML injection Inject 16 Units into the skin at bedtime.    Yes Historical Provider, MD  levothyroxine (SYNTHROID, LEVOTHROID) 150 MCG tablet Take 150 mcg by mouth every morning.  09/09/11  Yes Historical Provider, MD  Magnesium Oxide (MAGOX 400 PO) Take 1,200 mg by mouth 2 (two) times daily.   Yes Historical Provider, MD  Mycophenolate Mofetil (CELLCEPT PO) Take 1,000 mg by mouth 2 (two) times daily.    Yes Historical Provider, MD  omeprazole (PRILOSEC) 20 MG capsule Take 20 mg by mouth daily.   Yes Historical Provider, MD  amoxicillin-clavulanate (AUGMENTIN) 875-125 MG per tablet Take 1 tablet by mouth 2 (two) times daily.  04/26/13   Historical Provider, MD  clotrimazole (MYCELEX) 10 MG troche Take 10 mg by mouth 2 (two) times daily.    Historical Provider, MD  Cleda Clarks 100 UNIT/ML KiwkPen  04/24/13   Historical Provider, MD  isosorbide mononitrate (IMDUR) 30 MG 24 hr tablet Take 30 mg by mouth every morning.     Historical  Provider, MD  lactulose (CHRONULAC) 10 GM/15ML solution Take 30 mLs (20 g total) by mouth 3 (three) times daily. 01/24/13   Mahala Menghini, PA-C  NON FORMULARY Free - style strips 05/23/12   Historical Provider, MD  oxycodone (OXY-IR) 5 MG capsule Take 5 mg by mouth every 4 (four) hours as needed.    Historical Provider, MD  predniSONE (DELTASONE) 5 MG tablet Take 10 mg by mouth daily with breakfast.  04/21/13   Historical Provider, MD  senna (SENOKOT) 8.6 MG tablet Take 1 tablet by mouth daily.    Historical Provider, MD  ValGANciclovir HCl (VALCYTE PO) Take 900 mg by mouth 2 (two) times daily.    Historical Provider, MD    Allergies as of 08/20/2013  . (No Known Allergies)    Family History  Problem Relation Age of Onset  . Stroke Father 110  . Heart disease Mother 7    CHF  . Heart attack Brother   . Liver disease Neg Hx   . Colon cancer Neg Hx     History   Social History  . Marital Status: Married    Spouse Name: N/A    Number of Children: 4  . Years of Education: N/A   Occupational History  .     Social History Main Topics  . Smoking status: Never Smoker   . Smokeless tobacco: Never Used     Comment: Quit smoking at age 61  . Alcohol Use: 0.0 oz/week     Comment: occasionally  . Drug Use: No  . Sexual Activity: Not Currently   Other Topics Concern  . Not on file   Social History Narrative  . No narrative on file    Review of Systems: See HPI, otherwise negative ROS  Physical Exam: BP 110/64  Pulse 80  Temp(Src) 97.1 F (36.2 C)  Ht 5\' 7"  (1.702 m)  Wt 163 lb 12.8 oz (74.299 kg)  BMI 25.65 kg/m2 General:   Alert,  Well-developed, well-nourished, pleasant and cooperative in NAD Skin:  Intact without significant lesions or rashes. Eyes:  Sclera clear, no icterus.   Conjunctiva pink. Ears:  Normal auditory acuity. Nose:  No deformity, discharge,  or lesions. Mouth:  No deformity or lesions. Neck:  Supple; no masses or thyromegaly. No significant  cervical adenopathy. Lungs:  Clear throughout to auscultation.   No wheezes, crackles, or rhonchi. No acute distress. Heart:  Regular rate and rhythm; no murmurs, clicks, rubs,  or gallops. Abdomen: Non-distended, normal bowel sounds.  Patient has a "ping-pong ball" sized area of redness and firmness midline at the junction of his sternotomy site in his laparotomy scar. It is tender. There has not been any discharge. Avoid skin is red and firm. He states his been there for couple of weeks  Pulses:  Normal  pulses noted. Extremities:  Without clubbing or edema.  Impression/recommendations:: Liver transplant patient doing well GERD well-controlled. Recent bronchitis improved. He does have a nodular area midline likely related to prior suture abscess in the same area. I called and discussed with the transplant coordinator Dimas Chyle down at [facility]. She states this has happened before and he may need to have it drained. His next appointment is not until August however, she will get him in to see the surgical team in the next couple weeks to see what needs to be done about it. We'll also need to get his labs funneled my way Unless something comes up, plan to see him back in 3 months     Notice: This dictation was prepared with Dragon dictation along with smaller phrase technology. Any transcriptional errors that result from this process are unintentional and may not be corrected upon review.

## 2014-02-03 DIAGNOSIS — E875 Hyperkalemia: Secondary | ICD-10-CM | POA: Insufficient documentation

## 2014-03-12 DIAGNOSIS — Z944 Liver transplant status: Secondary | ICD-10-CM | POA: Diagnosis not present

## 2014-03-12 DIAGNOSIS — Z418 Encounter for other procedures for purposes other than remedying health state: Secondary | ICD-10-CM | POA: Diagnosis not present

## 2014-03-26 DIAGNOSIS — D899 Disorder involving the immune mechanism, unspecified: Secondary | ICD-10-CM | POA: Diagnosis not present

## 2014-03-26 DIAGNOSIS — Z944 Liver transplant status: Secondary | ICD-10-CM | POA: Diagnosis not present

## 2014-03-26 DIAGNOSIS — K432 Incisional hernia without obstruction or gangrene: Secondary | ICD-10-CM | POA: Diagnosis not present

## 2014-03-26 DIAGNOSIS — T814XXA Infection following a procedure, initial encounter: Secondary | ICD-10-CM | POA: Diagnosis not present

## 2014-03-27 DIAGNOSIS — K432 Incisional hernia without obstruction or gangrene: Secondary | ICD-10-CM | POA: Insufficient documentation

## 2014-04-06 ENCOUNTER — Encounter (HOSPITAL_COMMUNITY): Payer: Self-pay | Admitting: *Deleted

## 2014-04-06 ENCOUNTER — Emergency Department (HOSPITAL_COMMUNITY)
Admission: EM | Admit: 2014-04-06 | Discharge: 2014-04-07 | Disposition: A | Discharge status: Home / Self Care | Payer: Medicare Other | Attending: Emergency Medicine | Admitting: Emergency Medicine

## 2014-04-06 ENCOUNTER — Emergency Department (HOSPITAL_COMMUNITY): Payer: Medicare Other

## 2014-04-06 DIAGNOSIS — I2510 Atherosclerotic heart disease of native coronary artery without angina pectoris: Secondary | ICD-10-CM | POA: Diagnosis not present

## 2014-04-06 DIAGNOSIS — Z7952 Long term (current) use of systemic steroids: Secondary | ICD-10-CM | POA: Diagnosis not present

## 2014-04-06 DIAGNOSIS — R109 Unspecified abdominal pain: Secondary | ICD-10-CM | POA: Diagnosis not present

## 2014-04-06 DIAGNOSIS — Z862 Personal history of diseases of the blood and blood-forming organs and certain disorders involving the immune mechanism: Secondary | ICD-10-CM | POA: Diagnosis not present

## 2014-04-06 DIAGNOSIS — E785 Hyperlipidemia, unspecified: Secondary | ICD-10-CM | POA: Diagnosis not present

## 2014-04-06 DIAGNOSIS — E119 Type 2 diabetes mellitus without complications: Secondary | ICD-10-CM | POA: Insufficient documentation

## 2014-04-06 DIAGNOSIS — Z87891 Personal history of nicotine dependence: Secondary | ICD-10-CM | POA: Insufficient documentation

## 2014-04-06 DIAGNOSIS — Y998 Other external cause status: Secondary | ICD-10-CM | POA: Diagnosis not present

## 2014-04-06 DIAGNOSIS — W19XXXA Unspecified fall, initial encounter: Secondary | ICD-10-CM

## 2014-04-06 DIAGNOSIS — W010XXA Fall on same level from slipping, tripping and stumbling without subsequent striking against object, initial encounter: Secondary | ICD-10-CM | POA: Diagnosis not present

## 2014-04-06 DIAGNOSIS — Z79899 Other long term (current) drug therapy: Secondary | ICD-10-CM | POA: Insufficient documentation

## 2014-04-06 DIAGNOSIS — Y92009 Unspecified place in unspecified non-institutional (private) residence as the place of occurrence of the external cause: Secondary | ICD-10-CM | POA: Insufficient documentation

## 2014-04-06 DIAGNOSIS — Z87448 Personal history of other diseases of urinary system: Secondary | ICD-10-CM | POA: Diagnosis not present

## 2014-04-06 DIAGNOSIS — Z792 Long term (current) use of antibiotics: Secondary | ICD-10-CM | POA: Diagnosis not present

## 2014-04-06 DIAGNOSIS — Y9389 Activity, other specified: Secondary | ICD-10-CM | POA: Insufficient documentation

## 2014-04-06 DIAGNOSIS — Z7982 Long term (current) use of aspirin: Secondary | ICD-10-CM | POA: Diagnosis not present

## 2014-04-06 DIAGNOSIS — S2242XA Multiple fractures of ribs, left side, initial encounter for closed fracture: Secondary | ICD-10-CM | POA: Diagnosis not present

## 2014-04-06 DIAGNOSIS — K219 Gastro-esophageal reflux disease without esophagitis: Secondary | ICD-10-CM | POA: Insufficient documentation

## 2014-04-06 DIAGNOSIS — S3991XA Unspecified injury of abdomen, initial encounter: Secondary | ICD-10-CM | POA: Diagnosis not present

## 2014-04-06 DIAGNOSIS — S2232XA Fracture of one rib, left side, initial encounter for closed fracture: Secondary | ICD-10-CM

## 2014-04-06 DIAGNOSIS — E039 Hypothyroidism, unspecified: Secondary | ICD-10-CM | POA: Diagnosis not present

## 2014-04-06 DIAGNOSIS — Z794 Long term (current) use of insulin: Secondary | ICD-10-CM | POA: Diagnosis not present

## 2014-04-06 DIAGNOSIS — S299XXA Unspecified injury of thorax, initial encounter: Secondary | ICD-10-CM | POA: Diagnosis not present

## 2014-04-06 DIAGNOSIS — R079 Chest pain, unspecified: Secondary | ICD-10-CM | POA: Diagnosis not present

## 2014-04-06 DIAGNOSIS — S29001A Unspecified injury of muscle and tendon of front wall of thorax, initial encounter: Secondary | ICD-10-CM | POA: Diagnosis present

## 2014-04-06 LAB — CBC WITH DIFFERENTIAL/PLATELET
BASOS ABS: 0 10*3/uL (ref 0.0–0.1)
Basophils Relative: 0 % (ref 0–1)
Eosinophils Absolute: 0.1 10*3/uL (ref 0.0–0.7)
Eosinophils Relative: 1 % (ref 0–5)
HCT: 32 % — ABNORMAL LOW (ref 39.0–52.0)
HEMOGLOBIN: 10.2 g/dL — AB (ref 13.0–17.0)
LYMPHS ABS: 0.6 10*3/uL — AB (ref 0.7–4.0)
Lymphocytes Relative: 10 % — ABNORMAL LOW (ref 12–46)
MCH: 24.7 pg — ABNORMAL LOW (ref 26.0–34.0)
MCHC: 31.9 g/dL (ref 30.0–36.0)
MCV: 77.5 fL — ABNORMAL LOW (ref 78.0–100.0)
Monocytes Absolute: 0.4 10*3/uL (ref 0.1–1.0)
Monocytes Relative: 7 % (ref 3–12)
NEUTROS PCT: 82 % — AB (ref 43–77)
Neutro Abs: 5.3 10*3/uL (ref 1.7–7.7)
PLATELETS: 143 10*3/uL — AB (ref 150–400)
RBC: 4.13 MIL/uL — ABNORMAL LOW (ref 4.22–5.81)
RDW: 16.6 % — AB (ref 11.5–15.5)
WBC: 6.5 10*3/uL (ref 4.0–10.5)

## 2014-04-06 LAB — BASIC METABOLIC PANEL
Anion gap: 4 — ABNORMAL LOW (ref 5–15)
BUN: 35 mg/dL — AB (ref 6–23)
CO2: 26 mmol/L (ref 19–32)
Calcium: 9 mg/dL (ref 8.4–10.5)
Chloride: 106 mmol/L (ref 96–112)
Creatinine, Ser: 1.59 mg/dL — ABNORMAL HIGH (ref 0.50–1.35)
GFR calc Af Amer: 50 mL/min — ABNORMAL LOW (ref >90)
GFR calc non Af Amer: 43 mL/min — ABNORMAL LOW (ref >90)
GLUCOSE: 152 mg/dL — AB (ref 70–99)
POTASSIUM: 4.9 mmol/L (ref 3.5–5.1)
Sodium: 136 mmol/L (ref 135–145)

## 2014-04-06 LAB — PROTIME-INR
INR: 1.09 (ref 0.00–1.49)
Prothrombin Time: 14.2 seconds (ref 11.6–15.2)

## 2014-04-06 MED ORDER — SODIUM CHLORIDE 0.9 % IV BOLUS (SEPSIS)
1000.0000 mL | Freq: Once | INTRAVENOUS | Status: AC
  Administered 2014-04-06: 1000 mL via INTRAVENOUS

## 2014-04-06 MED ORDER — FENTANYL CITRATE 0.05 MG/ML IJ SOLN
25.0000 ug | Freq: Once | INTRAMUSCULAR | Status: AC
  Administered 2014-04-06: 25 ug via INTRAVENOUS
  Filled 2014-04-06: qty 2

## 2014-04-06 MED ORDER — FENTANYL CITRATE 0.05 MG/ML IJ SOLN
50.0000 ug | Freq: Once | INTRAMUSCULAR | Status: AC
  Administered 2014-04-06: 50 ug via INTRAVENOUS
  Filled 2014-04-06: qty 2

## 2014-04-06 NOTE — ED Notes (Signed)
Pt c/o left side pain; pt states he was getting out of the shower and slipped and fell and landed on his side striking the toilet;pt states he felt fine after the fall but started having nausea and pain started 20 mins after the fall;  pt had a liver transplant in November 2014

## 2014-04-07 ENCOUNTER — Emergency Department (HOSPITAL_COMMUNITY): Payer: Medicare Other

## 2014-04-07 DIAGNOSIS — I2510 Atherosclerotic heart disease of native coronary artery without angina pectoris: Secondary | ICD-10-CM | POA: Diagnosis not present

## 2014-04-07 DIAGNOSIS — S3991XA Unspecified injury of abdomen, initial encounter: Secondary | ICD-10-CM | POA: Diagnosis not present

## 2014-04-07 DIAGNOSIS — S299XXA Unspecified injury of thorax, initial encounter: Secondary | ICD-10-CM | POA: Diagnosis not present

## 2014-04-07 DIAGNOSIS — R109 Unspecified abdominal pain: Secondary | ICD-10-CM | POA: Diagnosis not present

## 2014-04-07 DIAGNOSIS — R079 Chest pain, unspecified: Secondary | ICD-10-CM | POA: Diagnosis not present

## 2014-04-07 MED ORDER — OXYCODONE-ACETAMINOPHEN 5-325 MG PO TABS: 1.0000 | ORAL_TABLET | ORAL | Status: DC | PRN

## 2014-04-07 MED ORDER — IOHEXOL 300 MG/ML  SOLN
80.0000 mL | Freq: Once | INTRAMUSCULAR | Status: AC | PRN
  Administered 2014-04-07: 80 mL via INTRAVENOUS

## 2014-04-07 MED ORDER — CYCLOBENZAPRINE HCL 5 MG PO TABS: 5.0000 mg | ORAL_TABLET | Freq: Three times a day (TID) | ORAL | Status: DC | PRN

## 2014-04-07 NOTE — ED Notes (Signed)
Pt. Refusing incentive spirometer. Pt. Reports he has one at home from previous hospitalization. Pt. Verbalized understanding of use and importance of using it. No distress noted at time of discharge.

## 2014-04-07 NOTE — ED Notes (Signed)
Pt. C/o left rib pain. Pt. Reports slipping and hitting left side on toilet. No bruising noted to abdomen. Pt. With hx of liver transplant and splenomegaly. Pt. Reports pain with laughter and deep breathing. Lung sounds clear, chest expansion symmetrical.

## 2014-04-07 NOTE — ED Provider Notes (Signed)
CSN: [id number]     Arrival date & time 04/06/14  2100 History   First MD Initiated Contact with Patient 04/06/14 2300     Chief Complaint  Patient presents with  . Fall     (Consider location/radiation/quality/duration/timing/severity/associated sxs/prior Treatment) HPI  Patient reports this evening he was going to take a shower. He states to avoid the cold glass of water that comes out when the shower first works he stepped towards the back of the tub. However he slipped and fell. He states he fell outside of the tub and landed on the toilet with his left chest. He states he then rolled off the toilet flat onto his back. He denies hitting his head or having loss of consciousness. He states that he is having pain in his left lower rib cage and states it hurts when he breathes, belches, or with any movement. He states about 20-30 minutes after he fell he had nausea and vomiting once that was a small amount. He denies feeling short of breath, he states it just hurts to breathe.  PCP Dr Merlyn Albert Liver transplant team at [facility], Dr Loletha Grayer  Past Medical History  Diagnosis Date  . Anemia     thrombocytopenia  . History of transfusion of whole blood   . Cirrhosis of liver     PT HAS HAD A LIVER TRANSPLANT-NASH, vaccinated for HepA/B on 05/31/11 afp 1.6, thrombus in splenic vein per ct  . Coronary artery disease 2004    s/p CAGB  . Hyperlipidemia   . Hypothyroidism   . GERD (gastroesophageal reflux disease)   . Atherosclerosis   . Gynecomastia, male     RIGHT  . Thrombocytopenia   . DM (diabetes mellitus)   . Esophageal varices      EGD 05/13/10, three columns grade II to III EV s/p banding, second banding since 01/2010.>Portal gastropathy.. >5 banding sessions in the past.  . Spleen enlarged   . Thrombus     Chronic splenic, portal and smv  . Overactive bladder   . Erectile dysfunction    Past Surgical History  Procedure Laterality Date  . Esophagogastroduodenoscopy  01/2010    4  COLUMNS 2-3 GRADE ESOPHAGEAL VARICES,S/P BANDING,PORTAL GASTROPATHY  . Coronary artery bypass graft  2004  . Cardiac catheterization  10/09/06  . Colonoscopy  01/2010    portal colopathy, sigmoid AVM (nonbleeding)  . Esophagogastroduodenoscopy  June 2012    Dr. Rourk-->3 columns of grade 1 esophageal varices with overlying scar, no banding treatment required, portal gastropathy. Next EGD in December 2013  . Esophagogastroduodenoscopy N/A 04/16/2012    Dr. Gala Romney: 3 columns Grade 2-3 esophageal varices, portal gastropathy, gastric antral vascular ectasia  . Liver transplant  01/2013   Family History  Problem Relation Age of Onset  . Stroke Father 63  . Heart disease Mother 53    CHF  . Heart attack Brother   . Liver disease Neg Hx   . Colon cancer Neg Hx    History  Substance Use Topics  . Smoking status: Former Research scientist (life sciences)  . Smokeless tobacco: Never Used     Comment: Quit smoking at age 47  . Alcohol Use: No     Comment: occasionally  lives at home Lives with spouse  Review of Systems  All other systems reviewed and are negative.     Allergies  Review of patient's allergies indicates no known allergies.  Home Medications   Prior to Admission medications   Medication Sig Start Date  End Date Taking? Authorizing Provider  amoxicillin-clavulanate (AUGMENTIN) 875-125 MG per tablet Take 1 tablet by mouth 2 (two) times daily.  04/26/13   Historical Provider, MD  aspirin 81 MG tablet Take 81 mg by mouth daily.    Historical Provider, MD  atorvastatin (LIPITOR) 10 MG tablet Take 10 mg by mouth every morning.     Historical Provider, MD  azithromycin (ZITHROMAX) 250 MG tablet Take 250 mg by mouth daily.    Historical Provider, MD  calcium carbonate (OS-CAL - DOSED IN MG OF ELEMENTAL CALCIUM) 1250 MG tablet Take 2 tablets by mouth 2 (two) times daily with a meal.    Historical Provider, MD  carvedilol (COREG) 3.125 MG tablet Take 3.125 mg by mouth at bedtime.    Historical Provider, MD    clotrimazole (MYCELEX) 10 MG troche Take 10 mg by mouth 2 (two) times daily.    Historical Provider, MD  CycloSPORINE Modified (GENGRAF PO) Take 150 mg by mouth 2 (two) times daily.     Historical Provider, MD  furosemide (LASIX) 20 MG tablet Take 20 mg by mouth 2 (two) times daily. Takes one tablet every other day 05/08/12   Orvil Feil, NP  HUMALOG KWIKPEN 100 UNIT/ML [facility]  04/24/13   Historical Provider, MD  insulin glargine (LANTUS) 100 UNIT/ML injection Inject 16 Units into the skin at bedtime.     Historical Provider, MD  isosorbide mononitrate (IMDUR) 30 MG 24 hr tablet Take 30 mg by mouth every morning.     Historical Provider, MD  lactulose (CHRONULAC) 10 GM/15ML solution Take 30 mLs (20 g total) by mouth 3 (three) times daily. 01/24/13   Mahala Menghini, PA-C  levothyroxine (SYNTHROID, LEVOTHROID) 150 MCG tablet Take 150 mcg by mouth every morning.  09/09/11   Historical Provider, MD  Magnesium Oxide (MAGOX 400 PO) Take 1,200 mg by mouth 2 (two) times daily.    Historical Provider, MD  Mycophenolate Mofetil (CELLCEPT PO) Take 1,000 mg by mouth 2 (two) times daily.     Historical Provider, MD  NON FORMULARY Free - style strips 05/23/12   Historical Provider, MD  omeprazole (PRILOSEC) 20 MG capsule Take 20 mg by mouth daily.    Historical Provider, MD  oxycodone (OXY-IR) 5 MG capsule Take 5 mg by mouth every 4 (four) hours as needed.    Historical Provider, MD  predniSONE (DELTASONE) 5 MG tablet Take 10 mg by mouth daily with breakfast.  04/21/13   Historical Provider, MD  senna (SENOKOT) 8.6 MG tablet Take 1 tablet by mouth daily.    Historical Provider, MD  ValGANciclovir HCl (VALCYTE PO) Take 900 mg by mouth 2 (two) times daily.    Historical Provider, MD   BP 127/63 mmHg  Pulse 64  Temp(Src) 98.2 F (36.8 C) (Oral)  Resp 15  Ht 5\' 7"  (1.702 m)  Wt 155 lb (70.308 kg)  BMI 24.27 kg/m2  SpO2 99%  Vital signs normal   Physical Exam  Constitutional: He is oriented to person, place,  and time. He appears well-developed and well-nourished.  Non-toxic appearance. He does not appear ill. No distress.  Appears painful, patient grabs his chest when he breathes or moves.  HENT:  Head: Normocephalic and atraumatic.  Right Ear: External ear normal.  Left Ear: External ear normal.  Nose: Nose normal. No mucosal edema or rhinorrhea.  Mouth/Throat: Oropharynx is clear and moist and mucous membranes are normal. No dental abscesses or uvula swelling.  Eyes: Conjunctivae and EOM are  normal. Pupils are equal, round, and reactive to light.  Neck: Normal range of motion and full passive range of motion without pain. Neck supple.  Cardiovascular: Normal rate, regular rhythm and normal heart sounds.  Exam reveals no gallop and no friction rub.   No murmur heard. Pulmonary/Chest: Effort normal and breath sounds normal. No respiratory distress. He has no wheezes. He has no rhonchi. He has no rales. He exhibits tenderness. He exhibits no crepitus.    Abdominal: Soft. Normal appearance and bowel sounds are normal. He exhibits no distension. There is tenderness. There is no rebound and no guarding.    Patient is tender in the left lower lateral rib cage without crepitance and in the left upper quadrant. Patient has a well-healed upper abdominal surgical scar that goes along the inferior edge of the rib cage. She has a small hernia underneath the left rib cage that he states he is already been evaluated by a surgeon for possible surgery.  Musculoskeletal: Normal range of motion. He exhibits no edema or tenderness.  Moves all extremities well.   Neurological: He is alert and oriented to person, place, and time. He has normal strength. No cranial nerve deficit.  Skin: Skin is warm, dry and intact. No rash noted. No erythema. No pallor.  Psychiatric: He has a normal mood and affect. His speech is normal and behavior is normal. His mood appears not anxious.  Nursing note and vitals reviewed.   ED  Course  Procedures (including critical care time)  Medications  fentaNYL (SUBLIMAZE) injection 25 mcg (25 mcg Intravenous Given 04/06/14 2239)  fentaNYL (SUBLIMAZE) injection 50 mcg (50 mcg Intravenous Given 04/06/14 2334)  sodium chloride 0.9 % bolus 1,000 mL (0 mLs Intravenous Stopped 04/07/14 0203)  iohexol (OMNIPAQUE) 300 MG/ML solution 80 mL (80 mLs Intravenous Contrast Given 04/07/14 0021)   Patient was recently seen at [facility] and states he had 2 retained sutures that they removed  Review of patient's blood tests from the hospital shows his creatinine was normal until November his creatinine was 1.6 with BUN of 28, January 6 his creatinine was 1.79 with a BUN of 48.   Patient was given IV fluids and IV pain medication. Patient was given his test results. He states he has an incentive spirometer at home that he can use. He was given precautions to return to the emergency room. Patient was given a prepack of Percocet to take home for pain.   Labs Review Results for orders placed or performed during the hospital encounter of 04/06/14  CBC with Differential  Result Value Ref Range   WBC 6.5 4.0 - 10.5 K/uL   RBC 4.13 (L) 4.22 - 5.81 MIL/uL   Hemoglobin 10.2 (L) 13.0 - 17.0 g/dL   HCT 32.0 (L) 39.0 - 52.0 %   MCV 77.5 (L) 78.0 - 100.0 fL   MCH 24.7 (L) 26.0 - 34.0 pg   MCHC 31.9 30.0 - 36.0 g/dL   RDW 16.6 (H) 11.5 - 15.5 %   Platelets 143 (L) 150 - 400 K/uL   Neutrophils Relative % 82 (H) 43 - 77 %   Neutro Abs 5.3 1.7 - 7.7 K/uL   Lymphocytes Relative 10 (L) 12 - 46 %   Lymphs Abs 0.6 (L) 0.7 - 4.0 K/uL   Monocytes Relative 7 3 - 12 %   Monocytes Absolute 0.4 0.1 - 1.0 K/uL   Eosinophils Relative 1 0 - 5 %   Eosinophils Absolute 0.1 0.0 - 0.7 K/uL  Basophils Relative 0 0 - 1 %   Basophils Absolute 0.0 0.0 - 0.1 K/uL  Protime-INR  Result Value Ref Range   Prothrombin Time 14.2 11.6 - 15.2 seconds   INR 1.09 0.00 - 5.88  Basic metabolic panel  Result Value Ref Range   Sodium  136 135 - 145 mmol/L   Potassium 4.9 3.5 - 5.1 mmol/L   Chloride 106 96 - 112 mmol/L   CO2 26 19 - 32 mmol/L   Glucose, Bld 152 (H) 70 - 99 mg/dL   BUN 35 (H) 6 - 23 mg/dL   Creatinine, Ser 1.59 (H) 0.50 - 1.35 mg/dL   Calcium 9.0 8.4 - 10.5 mg/dL   GFR calc non Af Amer 43 (L) >90 mL/min   GFR calc Af Amer 50 (L) >90 mL/min   Anion gap 4 (L) 5 - 15   Laboratory interpretation all normal except renal insufficiency, stable mild anemia     Imaging Review Dg Ribs Unilateral W/chest Left  04/06/2014   CLINICAL DATA:  Left-sided rib pain after fall in shower. Pain anterior mid and lower ribs.  EXAM: LEFT RIBS AND CHEST - 3+ VIEW  COMPARISON:  Chest radiograph 04/06/2012  FINDINGS: There mildly displaced fractures of the left anterior lateral seventh, eighth, and ninth ribs. No associated pneumothorax, consolidation, or pleural effusion. Patient is post median sternotomy and CABG. The heart size is normal.  IMPRESSION: Mildly displaced fractures left anterior lateral seventh, eighth, and ninth ribs. No pneumothorax.   Electronically Signed   By: Jeb Levering M.D.   On: 04/06/2014 23:29   Ct Chest W Contrast  Ct Abdomen Pelvis W Contrast  04/07/2014   CLINICAL DATA:  Acute onset of left-sided chest and abdominal pain, after falling when getting out of shower. Left side hit toilet. Nausea. Initial encounter.  EXAM: CT CHEST, ABDOMEN, AND PELVIS WITH CONTRAST  TECHNIQUE: Multidetector CT imaging of the chest, abdomen and pelvis was performed following the standard protocol during bolus administration of intravenous contrast.  CONTRAST:  14mL OMNIPAQUE IOHEXOL 300 MG/ML  SOLN  COMPARISON:  Chest radiograph performed 04/06/2014  FINDINGS: CT CHEST FINDINGS  Focal airspace opacity at the right lung base likely reflects atelectasis, given its appearance. A trace right-sided pleural effusion is noted. The lungs are otherwise clear. Minimal blebs are seen at the right lung apex. There is no evidence of  pneumothorax. There is no evidence of pulmonary parenchymal contusion.  Diffuse coronary artery calcifications are seen, with calcification at the aortic valve. No pericardial effusion is identified. No mediastinal lymphadenopathy is seen. There is no evidence of venous hemorrhage. Scattered calcification is noted along the aortic arch and proximal great vessels. The thyroid gland is unremarkable. No axillary lymphadenopathy is seen. The patient is status post median sternotomy.  Bilateral gynecomastia is noted. The chest wall is otherwise grossly unremarkable. There is no evidence of traumatic injury to the chest wall.  No acute osseous abnormalities are seen.  CT ABDOMEN AND PELVIS FINDINGS  No free air or free fluid is seen within the abdomen or pelvis. There is no evidence of solid or hollow organ injury.  The liver and spleen are unremarkable in appearance. The patient is status post cholecystectomy. The pancreas and adrenal glands are unremarkable.  Nonspecific perinephric stranding is noted bilaterally. The kidneys are otherwise unremarkable in appearance. There is no evidence of hydronephrosis. No renal or ureteral stones are seen.  The small bowel is unremarkable in appearance. The stomach is within normal limits.  No acute vascular abnormalities are seen. Diffuse calcification is seen along the abdominal aorta and its branches. There is also diffuse calcification along the portal venous system, with mild mural thrombus and mild to moderate luminal narrowing along the superior mesenteric vein. This likely reflects chronic underlying nonocclusive thrombosis.  The appendix is normal in caliber and contains trace residual contrast. There is no evidence for appendicitis. The proximal transverse colon is mildly redundant. The sigmoid colon is redundant. The colon is unremarkable.  The bladder is mildly distended and grossly unremarkable in appearance. The prostate remains normal in size, with minimal  calcification. No inguinal lymphadenopathy is seen.  No acute osseous abnormalities are identified. A calcified disc protrusion is seen at L5-S1, with associated vacuum phenomenon and disc space narrowing. Mild anterior wedging of L1 is thought to be developmental in nature.  IMPRESSION: 1. No evidence of traumatic injury to the chest, abdomen or pelvis. 2. Focal right basilar airspace opacity likely reflects atelectasis, with a trace right pleural effusion. 3. Minimal blebs at the right lung apex. 4. Diffuse coronary artery calcifications seen, and calcification at the aortic valve. 5. Bilateral gynecomastia noted. 6. Diffuse calcification along the abdominal aorta and its branches. 7. Diffuse calcification along the portal venous system, with mild mural thrombus and mild to moderate luminal narrowing along the superior mesenteric vein. This likely reflects chronic underlying nonocclusive thrombosis. 8. Calcified disc protrusion noted at L5-S1.   Electronically Signed   By: Garald Balding M.D.   On: 04/07/2014 01:26     EKG Interpretation None      MDM   Final diagnoses:  Fall at home, initial encounter  Rib fractures, left, closed, initial encounter   Discharge Medication List as of 04/07/2014  1:41 AM    START taking these medications   Details  cyclobenzaprine (FLEXERIL) 5 MG tablet Take 1 tablet (5 mg total) by mouth 3 (three) times daily as needed (muscle soreness)., Starting 04/07/2014, Until Discontinued, Print    !! oxyCODONE-acetaminophen (PERCOCET/ROXICET) 5-325 MG per tablet Take 1-2 tablets by mouth every 4 (four) hours as needed for severe pain., Starting 04/07/2014, Until Discontinued, Print    !! oxyCODONE-acetaminophen (PERCOCET/ROXICET) 5-325 MG per tablet Take 1 tablet by mouth every 4 (four) hours as needed for severe pain., Starting 04/07/2014, Until Discontinued, Print     !! - Potential duplicate medications found. Please discuss with provider.      Plan discharge  Rolland Porter, MD, Alanson Aly, MD 04/07/14 [phone]

## 2014-04-07 NOTE — Discharge Instructions (Signed)
Ice packs to the injured area. Take the medications as prescribed. Try to take a deep breath every 30-60 minutes to help prevent pneumonia. Return to the ED if you get a fever, worsening pain or feel like you are struggling to breathe.  Blunt Chest Trauma Blunt chest trauma is an injury caused by a blow to the chest. These chest injuries can be very painful. Blunt chest trauma often results in bruised or broken (fractured) ribs. Most cases of bruised and fractured ribs from blunt chest traumas get better after 1 to 3 weeks of rest and pain medicine. Often, the soft tissue in the chest wall is also injured, causing pain and bruising. Internal organs, such as the heart and lungs, may also be injured. Blunt chest trauma can lead to serious medical problems. This injury requires immediate medical care. CAUSES   Motor vehicle collisions.  Falls.  Physical violence.  Sports injuries. SYMPTOMS   Chest pain. The pain may be worse when you move or breathe deeply.  Shortness of breath.  Lightheadedness.  Bruising.  Tenderness.  Swelling. DIAGNOSIS  Your caregiver will do a physical exam. X-rays may be taken to look for fractures. However, minor rib fractures may not show up on X-rays until a few days after the injury. If a more serious injury is suspected, further imaging tests may be done. This may include ultrasounds, computed tomography (CT) scans, or magnetic resonance imaging (MRI). TREATMENT  Treatment depends on the severity of your injury. Your caregiver may prescribe pain medicines and deep breathing exercises. HOME CARE INSTRUCTIONS  Limit your activities until you can move around without much pain.  Do not do any strenuous work until your injury is healed.  Put ice on the injured area.  Put ice in a plastic bag.  Place a towel between your skin and the bag.  Leave the ice on for 15-20 minutes, 03-04 times a day.  You may wear a rib belt as directed by your caregiver to  reduce pain.  Practice deep breathing as directed by your caregiver to keep your lungs clear.  Only take over-the-counter or prescription medicines for pain, fever, or discomfort as directed by your caregiver. SEEK IMMEDIATE MEDICAL CARE IF:   You have increasing pain or shortness of breath.  You cough up blood.  You have nausea, vomiting, or abdominal pain.  You have a fever.  You feel dizzy, weak, or you faint. MAKE SURE YOU:  Understand these instructions.  Will watch your condition.  Will get help right away if you are not doing well or get worse. Document Released: 03/31/2004 Document Revised: 05/16/2011 Document Reviewed: 12/08/2010 [facility] Patient Information 2015 [place], [place]. This information is not intended to replace advice given to you by your health care provider. Make sure you discuss any questions you have with your health care provider.  Rib Fracture A rib fracture is a break or crack in one of the bones of the ribs. The ribs are a group of long, curved bones that wrap around your chest and attach to your spine. They protect your lungs and other organs in the chest cavity. A broken or cracked rib is often painful, but most do not cause other problems. Most rib fractures heal on their own over time. However, rib fractures can be more serious if multiple ribs are broken or if broken ribs move out of place and push against other structures. CAUSES   A direct blow to the chest. For example, this could happen during  contact sports, a car accident, or a fall against a hard object.  Repetitive movements with high force, such as pitching a baseball or having severe coughing spells. SYMPTOMS   Pain when you breathe in or cough.  Pain when someone presses on the injured area. DIAGNOSIS  Your caregiver will perform a physical exam. Various imaging tests may be ordered to confirm the diagnosis and to look for related injuries. These tests may include a chest X-ray,  computed tomography (CT), magnetic resonance imaging (MRI), or a bone scan. TREATMENT  Rib fractures usually heal on their own in 1-3 months. The longer healing period is often associated with a continued cough or other aggravating activities. During the healing period, pain control is very important. Medication is usually given to control pain. Hospitalization or surgery may be needed for more severe injuries, such as those in which multiple ribs are broken or the ribs have moved out of place.  HOME CARE INSTRUCTIONS   Avoid strenuous activity and any activities or movements that cause pain. Be careful during activities and avoid bumping the injured rib.  Gradually increase activity as directed by your caregiver.  Only take over-the-counter or prescription medications as directed by your caregiver. Do not take other medications without asking your caregiver first.  Apply ice to the injured area for the first 1-2 days after you have been treated or as directed by your caregiver. Applying ice helps to reduce inflammation and pain.  Put ice in a plastic bag.  Place a towel between your skin and the bag.   Leave the ice on for 15-20 minutes at a time, every 2 hours while you are awake.  Perform deep breathing as directed by your caregiver. This will help prevent pneumonia, which is a common complication of a broken rib. Your caregiver may instruct you to:  Take deep breaths several times a day.  Try to cough several times a day, holding a pillow against the injured area.  Use a device called an incentive spirometer to practice deep breathing several times a day.  Drink enough fluids to keep your urine clear or pale yellow. This will help you avoid constipation.   Do not wear a rib belt or binder. These restrict breathing, which can lead to pneumonia.  SEEK IMMEDIATE MEDICAL CARE IF:   You have a fever.   You have difficulty breathing or shortness of breath.   You develop a  continual cough, or you cough up thick or bloody sputum.  You feel sick to your stomach (nausea), throw up (vomit), or have abdominal pain.   You have worsening pain not controlled with medications.  MAKE SURE YOU:  Understand these instructions.  Will watch your condition.  Will get help right away if you are not doing well or get worse. Document Released: 02/21/2005 Document Revised: 10/24/2012 Document Reviewed: 04/25/2012 [facility] Patient Information 2015 [place], [place]. This information is not intended to replace advice given to you by your health care provider. Make sure you discuss any questions you have with your health care provider.

## 2014-04-14 DIAGNOSIS — E039 Hypothyroidism, unspecified: Secondary | ICD-10-CM | POA: Diagnosis not present

## 2014-04-14 DIAGNOSIS — S2242XD Multiple fractures of ribs, left side, subsequent encounter for fracture with routine healing: Secondary | ICD-10-CM | POA: Diagnosis not present

## 2014-04-14 DIAGNOSIS — R944 Abnormal results of kidney function studies: Secondary | ICD-10-CM | POA: Diagnosis not present

## 2014-04-14 DIAGNOSIS — I10 Essential (primary) hypertension: Secondary | ICD-10-CM | POA: Diagnosis not present

## 2014-04-22 DIAGNOSIS — D899 Disorder involving the immune mechanism, unspecified: Secondary | ICD-10-CM | POA: Diagnosis not present

## 2014-04-22 DIAGNOSIS — K432 Incisional hernia without obstruction or gangrene: Secondary | ICD-10-CM | POA: Diagnosis not present

## 2014-04-22 DIAGNOSIS — Z944 Liver transplant status: Secondary | ICD-10-CM | POA: Diagnosis not present

## 2014-05-26 DIAGNOSIS — J90 Pleural effusion, not elsewhere classified: Secondary | ICD-10-CM | POA: Diagnosis not present

## 2014-05-26 DIAGNOSIS — J9811 Atelectasis: Secondary | ICD-10-CM | POA: Diagnosis not present

## 2014-05-26 DIAGNOSIS — Z944 Liver transplant status: Secondary | ICD-10-CM | POA: Diagnosis not present

## 2014-05-26 DIAGNOSIS — K439 Ventral hernia without obstruction or gangrene: Secondary | ICD-10-CM | POA: Diagnosis not present

## 2014-05-26 DIAGNOSIS — K429 Umbilical hernia without obstruction or gangrene: Secondary | ICD-10-CM | POA: Diagnosis not present

## 2014-05-26 DIAGNOSIS — Z418 Encounter for other procedures for purposes other than remedying health state: Secondary | ICD-10-CM | POA: Diagnosis not present

## 2014-06-09 DIAGNOSIS — Z944 Liver transplant status: Secondary | ICD-10-CM | POA: Diagnosis not present

## 2014-06-09 DIAGNOSIS — Z418 Encounter for other procedures for purposes other than remedying health state: Secondary | ICD-10-CM | POA: Diagnosis not present

## 2014-06-24 DIAGNOSIS — K432 Incisional hernia without obstruction or gangrene: Secondary | ICD-10-CM | POA: Diagnosis not present

## 2014-06-24 DIAGNOSIS — N62 Hypertrophy of breast: Secondary | ICD-10-CM | POA: Diagnosis not present

## 2014-06-24 DIAGNOSIS — N183 Chronic kidney disease, stage 3 (moderate): Secondary | ICD-10-CM | POA: Diagnosis not present

## 2014-06-24 DIAGNOSIS — D696 Thrombocytopenia, unspecified: Secondary | ICD-10-CM | POA: Diagnosis not present

## 2014-06-24 DIAGNOSIS — Z48298 Encounter for aftercare following other organ transplant: Secondary | ICD-10-CM | POA: Diagnosis not present

## 2014-06-24 DIAGNOSIS — E785 Hyperlipidemia, unspecified: Secondary | ICD-10-CM | POA: Diagnosis not present

## 2014-06-24 DIAGNOSIS — Z944 Liver transplant status: Secondary | ICD-10-CM | POA: Diagnosis not present

## 2014-06-24 DIAGNOSIS — I2510 Atherosclerotic heart disease of native coronary artery without angina pectoris: Secondary | ICD-10-CM | POA: Diagnosis not present

## 2014-06-24 DIAGNOSIS — D899 Disorder involving the immune mechanism, unspecified: Secondary | ICD-10-CM | POA: Diagnosis not present

## 2014-06-24 DIAGNOSIS — Z951 Presence of aortocoronary bypass graft: Secondary | ICD-10-CM | POA: Diagnosis not present

## 2014-06-24 DIAGNOSIS — E079 Disorder of thyroid, unspecified: Secondary | ICD-10-CM | POA: Diagnosis not present

## 2014-06-24 DIAGNOSIS — E119 Type 2 diabetes mellitus without complications: Secondary | ICD-10-CM | POA: Diagnosis not present

## 2014-07-02 DIAGNOSIS — Z944 Liver transplant status: Secondary | ICD-10-CM | POA: Diagnosis not present

## 2014-07-02 DIAGNOSIS — E118 Type 2 diabetes mellitus with unspecified complications: Secondary | ICD-10-CM | POA: Diagnosis not present

## 2014-07-02 DIAGNOSIS — Z418 Encounter for other procedures for purposes other than remedying health state: Secondary | ICD-10-CM | POA: Diagnosis not present

## 2014-07-09 DIAGNOSIS — E119 Type 2 diabetes mellitus without complications: Secondary | ICD-10-CM | POA: Diagnosis not present

## 2014-07-10 DIAGNOSIS — K432 Incisional hernia without obstruction or gangrene: Secondary | ICD-10-CM | POA: Diagnosis not present

## 2014-07-10 DIAGNOSIS — Z4823 Encounter for aftercare following liver transplant: Secondary | ICD-10-CM | POA: Diagnosis not present

## 2014-07-10 DIAGNOSIS — E875 Hyperkalemia: Secondary | ICD-10-CM | POA: Diagnosis not present

## 2014-07-10 DIAGNOSIS — D899 Disorder involving the immune mechanism, unspecified: Secondary | ICD-10-CM | POA: Diagnosis not present

## 2014-07-10 DIAGNOSIS — R338 Other retention of urine: Secondary | ICD-10-CM | POA: Diagnosis not present

## 2014-07-10 DIAGNOSIS — L02211 Cutaneous abscess of abdominal wall: Secondary | ICD-10-CM | POA: Diagnosis not present

## 2014-07-10 DIAGNOSIS — T8189XA Other complications of procedures, not elsewhere classified, initial encounter: Secondary | ICD-10-CM | POA: Diagnosis not present

## 2014-07-10 DIAGNOSIS — R410 Disorientation, unspecified: Secondary | ICD-10-CM | POA: Diagnosis not present

## 2014-07-10 DIAGNOSIS — L928 Other granulomatous disorders of the skin and subcutaneous tissue: Secondary | ICD-10-CM | POA: Diagnosis not present

## 2014-07-10 DIAGNOSIS — E119 Type 2 diabetes mellitus without complications: Secondary | ICD-10-CM | POA: Diagnosis not present

## 2014-07-10 DIAGNOSIS — N183 Chronic kidney disease, stage 3 (moderate): Secondary | ICD-10-CM | POA: Diagnosis not present

## 2014-07-10 DIAGNOSIS — R339 Retention of urine, unspecified: Secondary | ICD-10-CM | POA: Diagnosis not present

## 2014-07-10 DIAGNOSIS — Z794 Long term (current) use of insulin: Secondary | ICD-10-CM | POA: Diagnosis not present

## 2014-07-10 DIAGNOSIS — Z951 Presence of aortocoronary bypass graft: Secondary | ICD-10-CM | POA: Diagnosis not present

## 2014-07-10 DIAGNOSIS — M958 Other specified acquired deformities of musculoskeletal system: Secondary | ICD-10-CM | POA: Diagnosis not present

## 2014-07-10 DIAGNOSIS — I2510 Atherosclerotic heart disease of native coronary artery without angina pectoris: Secondary | ICD-10-CM | POA: Diagnosis not present

## 2014-07-10 DIAGNOSIS — Z87891 Personal history of nicotine dependence: Secondary | ICD-10-CM | POA: Diagnosis not present

## 2014-07-10 DIAGNOSIS — Z944 Liver transplant status: Secondary | ICD-10-CM | POA: Diagnosis not present

## 2014-07-11 DIAGNOSIS — R338 Other retention of urine: Secondary | ICD-10-CM | POA: Insufficient documentation

## 2014-07-21 DIAGNOSIS — Z418 Encounter for other procedures for purposes other than remedying health state: Secondary | ICD-10-CM | POA: Diagnosis not present

## 2014-07-21 DIAGNOSIS — K432 Incisional hernia without obstruction or gangrene: Secondary | ICD-10-CM | POA: Diagnosis not present

## 2014-07-21 DIAGNOSIS — Z79899 Other long term (current) drug therapy: Secondary | ICD-10-CM | POA: Diagnosis not present

## 2014-07-21 DIAGNOSIS — E119 Type 2 diabetes mellitus without complications: Secondary | ICD-10-CM | POA: Diagnosis not present

## 2014-07-21 DIAGNOSIS — Z7982 Long term (current) use of aspirin: Secondary | ICD-10-CM | POA: Diagnosis not present

## 2014-07-21 DIAGNOSIS — Z792 Long term (current) use of antibiotics: Secondary | ICD-10-CM | POA: Diagnosis not present

## 2014-07-21 DIAGNOSIS — Z794 Long term (current) use of insulin: Secondary | ICD-10-CM | POA: Diagnosis not present

## 2014-07-21 DIAGNOSIS — Z4823 Encounter for aftercare following liver transplant: Secondary | ICD-10-CM | POA: Diagnosis not present

## 2014-07-21 DIAGNOSIS — I2510 Atherosclerotic heart disease of native coronary artery without angina pectoris: Secondary | ICD-10-CM | POA: Diagnosis not present

## 2014-07-21 DIAGNOSIS — Z951 Presence of aortocoronary bypass graft: Secondary | ICD-10-CM | POA: Diagnosis not present

## 2014-07-21 DIAGNOSIS — N183 Chronic kidney disease, stage 3 (moderate): Secondary | ICD-10-CM | POA: Diagnosis not present

## 2014-07-21 DIAGNOSIS — Z944 Liver transplant status: Secondary | ICD-10-CM | POA: Diagnosis not present

## 2014-07-29 DIAGNOSIS — D899 Disorder involving the immune mechanism, unspecified: Secondary | ICD-10-CM | POA: Diagnosis not present

## 2014-07-29 DIAGNOSIS — Z944 Liver transplant status: Secondary | ICD-10-CM | POA: Diagnosis not present

## 2014-07-29 DIAGNOSIS — K432 Incisional hernia without obstruction or gangrene: Secondary | ICD-10-CM | POA: Diagnosis not present

## 2014-08-21 DIAGNOSIS — D0472 Carcinoma in situ of skin of left lower limb, including hip: Secondary | ICD-10-CM | POA: Diagnosis not present

## 2014-08-21 DIAGNOSIS — D225 Melanocytic nevi of trunk: Secondary | ICD-10-CM | POA: Diagnosis not present

## 2014-08-21 DIAGNOSIS — L570 Actinic keratosis: Secondary | ICD-10-CM | POA: Diagnosis not present

## 2014-08-21 DIAGNOSIS — L821 Other seborrheic keratosis: Secondary | ICD-10-CM | POA: Diagnosis not present

## 2014-08-21 DIAGNOSIS — X32XXXD Exposure to sunlight, subsequent encounter: Secondary | ICD-10-CM | POA: Diagnosis not present

## 2014-09-25 DIAGNOSIS — Z08 Encounter for follow-up examination after completed treatment for malignant neoplasm: Secondary | ICD-10-CM | POA: Diagnosis not present

## 2014-09-25 DIAGNOSIS — Z85828 Personal history of other malignant neoplasm of skin: Secondary | ICD-10-CM | POA: Diagnosis not present

## 2014-10-20 DIAGNOSIS — Z944 Liver transplant status: Secondary | ICD-10-CM | POA: Diagnosis not present

## 2014-10-20 DIAGNOSIS — N179 Acute kidney failure, unspecified: Secondary | ICD-10-CM | POA: Diagnosis not present

## 2014-10-20 DIAGNOSIS — E875 Hyperkalemia: Secondary | ICD-10-CM | POA: Diagnosis not present

## 2014-10-20 DIAGNOSIS — Z48298 Encounter for aftercare following other organ transplant: Secondary | ICD-10-CM | POA: Diagnosis not present

## 2014-10-20 DIAGNOSIS — D899 Disorder involving the immune mechanism, unspecified: Secondary | ICD-10-CM | POA: Diagnosis not present

## 2014-10-22 DIAGNOSIS — Z418 Encounter for other procedures for purposes other than remedying health state: Secondary | ICD-10-CM | POA: Diagnosis not present

## 2014-10-22 DIAGNOSIS — Z944 Liver transplant status: Secondary | ICD-10-CM | POA: Diagnosis not present

## 2014-10-31 DIAGNOSIS — Z951 Presence of aortocoronary bypass graft: Secondary | ICD-10-CM | POA: Diagnosis not present

## 2014-10-31 DIAGNOSIS — I2510 Atherosclerotic heart disease of native coronary artery without angina pectoris: Secondary | ICD-10-CM | POA: Diagnosis not present

## 2014-11-06 DIAGNOSIS — Z944 Liver transplant status: Secondary | ICD-10-CM | POA: Diagnosis not present

## 2014-11-06 DIAGNOSIS — Z418 Encounter for other procedures for purposes other than remedying health state: Secondary | ICD-10-CM | POA: Diagnosis not present

## 2014-11-07 DIAGNOSIS — E875 Hyperkalemia: Secondary | ICD-10-CM | POA: Diagnosis not present

## 2014-11-22 DIAGNOSIS — Z418 Encounter for other procedures for purposes other than remedying health state: Secondary | ICD-10-CM | POA: Diagnosis not present

## 2014-11-22 DIAGNOSIS — Z944 Liver transplant status: Secondary | ICD-10-CM | POA: Diagnosis not present

## 2014-11-26 DIAGNOSIS — E875 Hyperkalemia: Secondary | ICD-10-CM | POA: Diagnosis not present

## 2014-12-04 DIAGNOSIS — R944 Abnormal results of kidney function studies: Secondary | ICD-10-CM | POA: Diagnosis not present

## 2015-01-03 DIAGNOSIS — Z944 Liver transplant status: Secondary | ICD-10-CM | POA: Diagnosis not present

## 2015-01-03 DIAGNOSIS — Z418 Encounter for other procedures for purposes other than remedying health state: Secondary | ICD-10-CM | POA: Diagnosis not present

## 2015-01-09 DIAGNOSIS — Z794 Long term (current) use of insulin: Secondary | ICD-10-CM | POA: Diagnosis not present

## 2015-01-09 DIAGNOSIS — E119 Type 2 diabetes mellitus without complications: Secondary | ICD-10-CM | POA: Diagnosis not present

## 2015-01-26 DIAGNOSIS — D696 Thrombocytopenia, unspecified: Secondary | ICD-10-CM | POA: Diagnosis not present

## 2015-01-26 DIAGNOSIS — D899 Disorder involving the immune mechanism, unspecified: Secondary | ICD-10-CM | POA: Diagnosis not present

## 2015-01-26 DIAGNOSIS — Z944 Liver transplant status: Secondary | ICD-10-CM | POA: Diagnosis not present

## 2015-01-26 DIAGNOSIS — N183 Chronic kidney disease, stage 3 (moderate): Secondary | ICD-10-CM | POA: Diagnosis not present

## 2015-01-26 DIAGNOSIS — Z48298 Encounter for aftercare following other organ transplant: Secondary | ICD-10-CM | POA: Diagnosis not present

## 2015-02-02 DIAGNOSIS — E875 Hyperkalemia: Secondary | ICD-10-CM | POA: Diagnosis not present

## 2015-02-06 DIAGNOSIS — E875 Hyperkalemia: Secondary | ICD-10-CM | POA: Diagnosis not present

## 2015-02-10 DIAGNOSIS — Z9841 Cataract extraction status, right eye: Secondary | ICD-10-CM | POA: Diagnosis not present

## 2015-02-10 DIAGNOSIS — Z961 Presence of intraocular lens: Secondary | ICD-10-CM | POA: Diagnosis not present

## 2015-02-10 DIAGNOSIS — H31001 Unspecified chorioretinal scars, right eye: Secondary | ICD-10-CM | POA: Diagnosis not present

## 2015-02-10 DIAGNOSIS — H43391 Other vitreous opacities, right eye: Secondary | ICD-10-CM | POA: Diagnosis not present

## 2015-02-12 DIAGNOSIS — E875 Hyperkalemia: Secondary | ICD-10-CM | POA: Diagnosis not present

## 2015-03-12 DIAGNOSIS — X32XXXD Exposure to sunlight, subsequent encounter: Secondary | ICD-10-CM | POA: Diagnosis not present

## 2015-03-12 DIAGNOSIS — Z85828 Personal history of other malignant neoplasm of skin: Secondary | ICD-10-CM | POA: Diagnosis not present

## 2015-03-12 DIAGNOSIS — L820 Inflamed seborrheic keratosis: Secondary | ICD-10-CM | POA: Diagnosis not present

## 2015-03-12 DIAGNOSIS — Z1283 Encounter for screening for malignant neoplasm of skin: Secondary | ICD-10-CM | POA: Diagnosis not present

## 2015-03-12 DIAGNOSIS — L570 Actinic keratosis: Secondary | ICD-10-CM | POA: Diagnosis not present

## 2015-03-12 DIAGNOSIS — Z08 Encounter for follow-up examination after completed treatment for malignant neoplasm: Secondary | ICD-10-CM | POA: Diagnosis not present

## 2015-03-19 DIAGNOSIS — R05 Cough: Secondary | ICD-10-CM | POA: Diagnosis not present

## 2015-03-19 DIAGNOSIS — J00 Acute nasopharyngitis [common cold]: Secondary | ICD-10-CM | POA: Diagnosis not present

## 2015-04-06 DIAGNOSIS — Z961 Presence of intraocular lens: Secondary | ICD-10-CM | POA: Diagnosis not present

## 2015-04-06 DIAGNOSIS — H2512 Age-related nuclear cataract, left eye: Secondary | ICD-10-CM | POA: Diagnosis not present

## 2015-04-06 DIAGNOSIS — E119 Type 2 diabetes mellitus without complications: Secondary | ICD-10-CM | POA: Diagnosis not present

## 2015-04-06 DIAGNOSIS — H31091 Other chorioretinal scars, right eye: Secondary | ICD-10-CM | POA: Diagnosis not present

## 2015-04-14 NOTE — Patient Instructions (Signed)
Your procedure is scheduled on:   04/20/2015              Report to [facility] at  9:30  AM.  Call this number if you have problems the morning of surgery:[phone]   Remember:   Do not eat or drink :After Midnight.    Take these medicines the morning of surgery with A SIP OF WATER:    Coreg, Levothyroxine and omeprazole        Do not wear jewelry, make-up or nail polish.  Do not wear lotions, powders, or perfumes. You may wear deodorant.  Do not shave 48 hours prior to surgery.  Do not bring valuables to the hospital.  Contacts, dentures or bridgework may not be worn into surgery.  Patients discharged the day of surgery will not be allowed to drive home.  Name and phone number of your driver:    @[email or web address] @ Cataract Surgery  A cataract is a clouding of the lens of the eye. When a lens becomes cloudy, vision is reduced based on the degree and nature of the clouding. Surgery may be needed to improve vision. Surgery removes the cloudy lens and usually replaces it with a substitute lens (intraocular lens, IOL). LET YOUR EYE DOCTOR KNOW ABOUT:  Allergies to food or medicine.   Medicines taken including herbs, eyedrops, over-the-counter medicines, and creams.   Use of steroids (by mouth or creams).   Previous problems with anesthetics or numbing medicine.   History of bleeding problems or blood clots.   Previous surgery.   Other health problems, including diabetes and kidney problems.   Possibility of pregnancy, if this applies.  RISKS AND COMPLICATIONS  Infection.   Inflammation of the eyeball (endophthalmitis) that can spread to both eyes (sympathetic ophthalmia).   Poor wound healing.   If an IOL is inserted, it can later fall out of proper position. This is very uncommon.   Clouding of the part of your eye that holds an IOL in place. This is called an "after-cataract." These are uncommon, but easily treated.  BEFORE THE PROCEDURE  Do not eat or drink  anything except small amounts of water for 8 to 12 before your surgery, or as directed by your caregiver.   Unless you are told otherwise, continue any eyedrops you have been prescribed.   Talk to your primary caregiver about all other medicines that you take (both prescription and non-prescription). In some cases, you may need to stop or change medicines near the time of your surgery. This is most important if you are taking blood-thinning medicine.Do not stop medicines unless you are told to do so.   Arrange for someone to drive you to and from the procedure.   Do not put contact lenses in either eye on the day of your surgery.  PROCEDURE There is more than one method for safely removing a cataract. Your doctor can explain the differences and help determine which is best for you. Phacoemulsification surgery is the most common form of cataract surgery.  An injection is given behind the eye or eyedrops are given to make this a painless procedure.   A small cut (incision) is made on the edge of the clear, dome-shaped surface that covers the front of the eye (cornea).   A tiny probe is painlessly inserted into the eye. This device gives off ultrasound waves that soften and break up the cloudy center of the lens. This makes it easier for the cloudy lens to  be removed by suction.   An IOL may be implanted.   The normal lens of the eye is covered by a clear capsule. Part of that capsule is intentionally left in the eye to support the IOL.   Your surgeon may or may not use stitches to close the incision.  There are other forms of cataract surgery that require a larger incision and stiches to close the eye. This approach is taken in cases where the doctor feels that the cataract cannot be easily removed using phacoemulsification. AFTER THE PROCEDURE  When an IOL is implanted, it does not need care. It becomes a permanent part of your eye and cannot be seen or felt.   Your doctor will schedule  follow-up exams to check on your progress.   Review your other medicines with your doctor to see which can be resumed after surgery.   Use eyedrops or take medicine as prescribed by your doctor.  Document Released: 02/10/2011 Document Reviewed: 02/07/2011 [facility] Patient Information 2012 [place].  .Cataract Surgery Care After Refer to this sheet in the next few weeks. These instructions provide you with information on caring for yourself after your procedure. Your caregiver may also give you more specific instructions. Your treatment has been planned according to current medical practices, but problems sometimes occur. Call your caregiver if you have any problems or questions after your procedure.  HOME CARE INSTRUCTIONS   Avoid strenuous activities as directed by your caregiver.   Ask your caregiver when you can resume driving.   Use eyedrops or other medicines to help healing and control pressure inside your eye as directed by your caregiver.   Only take over-the-counter or prescription medicines for pain, discomfort, or fever as directed by your caregiver.   Do not to touch or rub your eyes.   You may be instructed to use a protective shield during the first few days and nights after surgery. If not, wear sunglasses to protect your eyes. This is to protect the eye from pressure or from being accidentally bumped.   Keep the area around your eye clean and dry. Avoid swimming or allowing water to hit you directly in the face while showering. Keep soap and shampoo out of your eyes.   Do not bend or lift heavy objects. Bending increases pressure in the eye. You can walk, climb stairs, and do light household chores.   Do not put a contact lens into the eye that had surgery until your caregiver says it is okay to do so.   Ask your doctor when you can return to work. This will depend on the kind of work that you do. If you work in a dusty environment, you may be advised to wear  protective eyewear for a period of time.   Ask your caregiver when it will be safe to engage in sexual activity.   Continue with your regular eye exams as directed by your caregiver.  What to expect:  It is normal to feel itching and mild discomfort for a few days after cataract surgery. Some fluid discharge is also common, and your eye may be sensitive to light and touch.   After 1 to 2 days, even moderate discomfort should disappear. In most cases, healing will take about 6 weeks.   If you received an intraocular lens (IOL), you may notice that colors are very bright or have a blue tinge. Also, if you have been in bright sunlight, everything may appear reddish for a few  hours. If you see these color tinges, it is because your lens is clear and no longer cloudy. Within a few months after receiving an IOL, these extra colors should go away. When you have healed, you will probably need new glasses.  SEEK MEDICAL CARE IF:   You have increased bruising around your eye.   You have discomfort not helped by medicine.  SEEK IMMEDIATE MEDICAL CARE IF:   You have a fever.   You have a worsening or sudden vision loss.   You have redness, swelling, or increasing pain in the eye.   You have a thick discharge from the eye that had surgery.  MAKE SURE YOU:  Understand these instructions.   Will watch your condition.   Will get help right away if you are not doing well or get worse.  Document Released: 09/10/2004 Document Revised: 02/10/2011 Document Reviewed: 10/15/2010 [facility] Patient Information 2012 [place].    Monitored Anesthesia Care  Monitored anesthesia care is an anesthesia service for a medical procedure. Anesthesia is the loss of the ability to feel pain. It is produced by medications called anesthetics. It may affect a small area of your body (local anesthesia), a large area of your body (regional anesthesia), or your entire body (general anesthesia). The need for  monitored anesthesia care depends your procedure, your condition, and the potential need for regional or general anesthesia. It is often provided during procedures where:   General anesthesia may be needed if there are complications. This is because you need special care when you are under general anesthesia.   You will be under local or regional anesthesia. This is so that you are able to have higher levels of anesthesia if needed.   You will receive calming medications (sedatives). This is especially the case if sedatives are given to put you in a semi-conscious state of relaxation (deep sedation). This is because the amount of sedative needed to produce this state can be hard to predict. Too much of a sedative can produce general anesthesia. Monitored anesthesia care is performed by one or more caregivers who have special training in all types of anesthesia. You will need to meet with these caregivers before your procedure. During this meeting, they will ask you about your medical history. They will also give you instructions to follow. (For example, you will need to stop eating and drinking before your procedure. You may also need to stop or change medications you are taking.) During your procedure, your caregivers will stay with you. They will:   Watch your condition. This includes watching you blood pressure, breathing, and level of pain.   Diagnose and treat problems that occur.   Give medications if they are needed. These may include calming medications (sedatives) and anesthetics.   Make sure you are comfortable.  Having monitored anesthesia care does not necessarily mean that you will be under anesthesia. It does mean that your caregivers will be able to manage anesthesia if you need it or if it occurs. It also means that you will be able to have a different type of anesthesia than you are having if you need it. When your procedure is complete, your caregivers will continue to watch  your condition. They will make sure any medications wear off before you are allowed to go home.  Document Released: 11/17/2004 Document Revised: 06/18/2012 Document Reviewed: 04/04/2012 [facility] Patient Information 2014 [place], [place].

## 2015-04-15 ENCOUNTER — Encounter (HOSPITAL_COMMUNITY): Payer: Self-pay

## 2015-04-15 ENCOUNTER — Encounter (HOSPITAL_COMMUNITY)
Admission: RE | Admit: 2015-04-15 | Discharge: 2015-04-15 | Disposition: A | Discharge status: Home / Self Care | Payer: Medicare Other | Source: Ambulatory Visit | Attending: Ophthalmology | Admitting: Ophthalmology

## 2015-04-15 DIAGNOSIS — I10 Essential (primary) hypertension: Secondary | ICD-10-CM | POA: Diagnosis not present

## 2015-04-15 DIAGNOSIS — Z01810 Encounter for preprocedural cardiovascular examination: Secondary | ICD-10-CM | POA: Diagnosis not present

## 2015-04-15 HISTORY — DX: Essential (primary) hypertension: I10

## 2015-04-15 HISTORY — DX: Benign prostatic hyperplasia without lower urinary tract symptoms: N40.0

## 2015-04-15 NOTE — Pre-Procedure Instructions (Signed)
Patient given information to sign up for my chart at home. 

## 2015-04-20 ENCOUNTER — Ambulatory Visit (HOSPITAL_COMMUNITY): Payer: Medicare Other | Admitting: Anesthesiology

## 2015-04-20 ENCOUNTER — Ambulatory Visit (HOSPITAL_COMMUNITY)
Admission: RE | Admit: 2015-04-20 | Discharge: 2015-04-20 | Disposition: A | Discharge status: Home / Self Care | Payer: Medicare Other | Source: Ambulatory Visit | Attending: Ophthalmology | Admitting: Ophthalmology

## 2015-04-20 ENCOUNTER — Encounter (HOSPITAL_COMMUNITY): Payer: Self-pay | Admitting: Anesthesiology

## 2015-04-20 ENCOUNTER — Encounter (HOSPITAL_COMMUNITY)
Admission: RE | Disposition: A | Discharge status: Home / Self Care | Payer: Self-pay | Source: Ambulatory Visit | Attending: Ophthalmology

## 2015-04-20 DIAGNOSIS — Z944 Liver transplant status: Secondary | ICD-10-CM | POA: Insufficient documentation

## 2015-04-20 DIAGNOSIS — K219 Gastro-esophageal reflux disease without esophagitis: Secondary | ICD-10-CM | POA: Diagnosis not present

## 2015-04-20 DIAGNOSIS — I2510 Atherosclerotic heart disease of native coronary artery without angina pectoris: Secondary | ICD-10-CM | POA: Diagnosis not present

## 2015-04-20 DIAGNOSIS — I10 Essential (primary) hypertension: Secondary | ICD-10-CM | POA: Insufficient documentation

## 2015-04-20 DIAGNOSIS — H2512 Age-related nuclear cataract, left eye: Secondary | ICD-10-CM | POA: Insufficient documentation

## 2015-04-20 DIAGNOSIS — Z7982 Long term (current) use of aspirin: Secondary | ICD-10-CM | POA: Insufficient documentation

## 2015-04-20 DIAGNOSIS — Z79899 Other long term (current) drug therapy: Secondary | ICD-10-CM | POA: Diagnosis not present

## 2015-04-20 DIAGNOSIS — Z794 Long term (current) use of insulin: Secondary | ICD-10-CM | POA: Insufficient documentation

## 2015-04-20 DIAGNOSIS — E109 Type 1 diabetes mellitus without complications: Secondary | ICD-10-CM | POA: Insufficient documentation

## 2015-04-20 DIAGNOSIS — H269 Unspecified cataract: Secondary | ICD-10-CM | POA: Diagnosis not present

## 2015-04-20 DIAGNOSIS — Z951 Presence of aortocoronary bypass graft: Secondary | ICD-10-CM | POA: Insufficient documentation

## 2015-04-20 HISTORY — PX: CATARACT EXTRACTION W/PHACO: SHX586

## 2015-04-20 LAB — GLUCOSE, CAPILLARY: GLUCOSE-CAPILLARY: 102 mg/dL — AB (ref 65–99)

## 2015-04-20 SURGERY — PHACOEMULSIFICATION, CATARACT, WITH IOL INSERTION
Anesthesia: Monitor Anesthesia Care | Site: Eye | Laterality: Left

## 2015-04-20 MED ORDER — EPINEPHRINE HCL 1 MG/ML IJ SOLN
INTRAMUSCULAR | Status: DC | PRN
  Administered 2015-04-20: 500 mL

## 2015-04-20 MED ORDER — EPINEPHRINE HCL 1 MG/ML IJ SOLN
INTRAMUSCULAR | Status: AC
  Filled 2015-04-20: qty 1

## 2015-04-20 MED ORDER — MIDAZOLAM HCL 2 MG/2ML IJ SOLN
1.0000 mg | INTRAMUSCULAR | Status: DC | PRN
  Administered 2015-04-20: 2 mg via INTRAVENOUS
  Filled 2015-04-20: qty 2

## 2015-04-20 MED ORDER — BSS IO SOLN
INTRAOCULAR | Status: DC | PRN
  Administered 2015-04-20: 15 mL

## 2015-04-20 MED ORDER — TETRACAINE HCL 0.5 % OP SOLN
1.0000 [drp] | OPHTHALMIC | Status: AC
  Administered 2015-04-20 (×3): 1 [drp] via OPHTHALMIC

## 2015-04-20 MED ORDER — POVIDONE-IODINE 5 % OP SOLN
OPHTHALMIC | Status: DC | PRN
  Administered 2015-04-20: 1 via OPHTHALMIC

## 2015-04-20 MED ORDER — CYCLOPENTOLATE-PHENYLEPHRINE 0.2-1 % OP SOLN
1.0000 [drp] | OPHTHALMIC | Status: AC
  Administered 2015-04-20 (×3): 1 [drp] via OPHTHALMIC

## 2015-04-20 MED ORDER — PHENYLEPHRINE HCL 2.5 % OP SOLN
1.0000 [drp] | OPHTHALMIC | Status: AC
  Administered 2015-04-20 (×3): 1 [drp] via OPHTHALMIC

## 2015-04-20 MED ORDER — PROVISC 10 MG/ML IO SOLN
INTRAOCULAR | Status: DC | PRN
  Administered 2015-04-20: 0.85 mL via INTRAOCULAR

## 2015-04-20 MED ORDER — LACTATED RINGERS IV SOLN
INTRAVENOUS | Status: DC
  Administered 2015-04-20: 11:00:00 via INTRAVENOUS

## 2015-04-20 MED ORDER — LIDOCAINE HCL 3.5 % OP GEL
1.0000 "application " | Freq: Once | OPHTHALMIC | Status: AC
  Administered 2015-04-20: 1 via OPHTHALMIC

## 2015-04-20 MED ORDER — NEOMYCIN-POLYMYXIN-DEXAMETH 3.5-10000-0.1 OP SUSP
OPHTHALMIC | Status: DC | PRN
  Administered 2015-04-20: 2 [drp] via OPHTHALMIC

## 2015-04-20 MED ORDER — LIDOCAINE HCL (PF) 1 % IJ SOLN
INTRAOCULAR | Status: DC | PRN
  Administered 2015-04-20: .8 mL via OPHTHALMIC

## 2015-04-20 SURGICAL SUPPLY — 12 items

## 2015-04-20 NOTE — Transfer of Care (Signed)
Immediate Anesthesia Transfer of Care Note  Patient: [name]  Procedure(s) Performed: Procedure(s): CATARACT EXTRACTION PHACO AND INTRAOCULAR LENS PLACEMENT LEFT EYE CDE=14.92 (Left)  Patient Location: Short Stay  Anesthesia Type:MAC  Level of Consciousness: awake, alert , oriented and patient cooperative  Airway & Oxygen Therapy: Patient Spontanous Breathing  Post-op Assessment: Report given to RN and Post -op Vital signs reviewed and stable  Post vital signs: Reviewed and stable  Last Vitals:  Filed Vitals:   04/20/15 1045 04/20/15 1050  BP: 131/66   Pulse:    Temp:    Resp: 19 0    Complications: No apparent anesthesia complications

## 2015-04-20 NOTE — Op Note (Signed)
Date of Admission: 04/20/2015  Date of Surgery: 04/20/2015   Pre-Op Dx: Cataract Left Eye  Post-Op Dx: Senile Nuclear Cataract Left  Eye,  Dx Code H25.12  Surgeon: Tonny Branch, M.D.  Assistants: None  Anesthesia: Topical with MAC  Indications: Painless, progressive loss of vision with compromise of daily activities.  Surgery: Cataract Extraction with Intraocular lens Implant Left Eye  Discription: The patient had dilating drops and viscous lidocaine placed into the Left eye in the pre-op holding area. After transfer to the operating room, a time out was performed. The patient was then prepped and draped. Beginning with a 59 degree blade a paracentesis port was made at the surgeon's 2 o'clock position. The anterior chamber was then filled with 1% non-preserved lidocaine. This was followed by filling the anterior chamber with Provisc.  A 2.67mm keratome blade was used to make a clear corneal incision at the temporal limbus.  A bent cystatome needle was used to create a continuous tear capsulotomy. Hydrodissection was performed with balanced salt solution on a Fine canula. The lens nucleus was then removed using the phacoemulsification handpiece. Residual cortex was removed with the I&A handpiece. The anterior chamber and capsular bag were refilled with Provisc. A posterior chamber intraocular lens was placed into the capsular bag with it's injector. The implant was positioned with the Kuglan hook. The Provisc was then removed from the anterior chamber and capsular bag with the I&A handpiece. Stromal hydration of the main incision and paracentesis port was performed with BSS on a Fine canula. The wounds were tested for leak which was negative. The patient tolerated the procedure well. There were no operative complications. The patient was then transferred to the recovery room in stable condition.  Complications: None  Specimen: None  EBL: None  Prosthetic device: Hoya iSert 250, power 18.0 D, SN  [id number].

## 2015-04-20 NOTE — Anesthesia Preprocedure Evaluation (Signed)
Anesthesia Evaluation  Patient identified by MRN, date of birth, ID band Patient awake    Reviewed: Allergy & Precautions, NPO status , Patient's Chart, lab work & pertinent test results, reviewed documented beta blocker date and time   Airway Mallampati: II  TM Distance: >3 FB Neck ROM: Full    Dental  (+) Missing, Edentulous Upper,    Pulmonary former smoker,    Pulmonary exam normal        Cardiovascular hypertension, Pt. on medications and Pt. on home beta blockers + CAD and + CABG  Normal cardiovascular exam     Neuro/Psych  Neuromuscular disease    GI/Hepatic GERD  Medicated and Controlled,  Endo/Other  diabetes, Well Controlled, Type 1, Insulin DependentHypothyroidism   Renal/GU      Musculoskeletal   Abdominal Normal abdominal exam  (+)   Peds  Hematology  (+) anemia ,   Anesthesia Other Findings   Reproductive/Obstetrics                             Anesthesia Physical Anesthesia Plan  ASA: III  Anesthesia Plan: MAC   Post-op Pain Management:    Induction:   Airway Management Planned: Nasal Cannula  Additional Equipment:   Intra-op Plan:   Post-operative Plan:   Informed Consent: I have reviewed the patients History and Physical, chart, labs and discussed the procedure including the risks, benefits and alternatives for the proposed anesthesia with the patient or authorized representative who has indicated his/her understanding and acceptance.     Plan Discussed with: CRNA  Anesthesia Plan Comments:         Anesthesia Quick Evaluation

## 2015-04-20 NOTE — H&P (Signed)
I have reviewed the H&P, the patient was re-examined, and I have identified no interval changes in medical condition and plan of care since the history and physical of record  

## 2015-04-20 NOTE — Discharge Instructions (Signed)
PATIENT INSTRUCTIONS POST-ANESTHESIA  IMMEDIATELY FOLLOWING SURGERY:  Do not drive or operate machinery for the first twenty four hours after surgery.  Do not make any important decisions for twenty four hours after surgery or while taking narcotic pain medications or sedatives.  If you develop intractable nausea and vomiting or a severe headache please notify your doctor immediately.  FOLLOW-UP:  Please make an appointment with your surgeon as instructed. You do not need to follow up with anesthesia unless specifically instructed to do so.  WOUND CARE INSTRUCTIONS (if applicable):  Keep a dry clean dressing on the anesthesia/puncture wound site if there is drainage.  Once the wound has quit draining you may leave it open to air.  Generally you should leave the bandage intact for twenty four hours unless there is drainage.  If the epidural site drains for more than 36-48 hours please call the anesthesia department.  QUESTIONS?:  Please feel free to call your physician or the hospital operator if you have any questions, and they will be happy to assist you.      Wound Care Taking care of your wound properly can help to prevent pain and infection. It can also help your wound to heal more quickly.  HOW TO CARE FOR YOUR WOUND  Take or apply over-the-counter and prescription medicines only as told by your health care provider.  If you were prescribed antibiotic medicine, take or apply it as told by your health care provider. Do not stop using the antibiotic even if your condition improves.  Clean the wound each day or as told by your health care provider.  Wash the wound with mild soap and water.  Rinse the wound with water to remove all soap.  Pat the wound dry with a clean towel. Do not rub it.  There are many different ways to close and cover a wound. For example, a wound can be covered with stitches (sutures), skin glue, or adhesive strips. Follow instructions from your health care  provider about:  How to take care of your wound.  When and how you should change your bandage (dressing).  When you should remove your dressing.  Removing whatever was used to close your wound.  Check your wound every day for signs of infection. Watch for:  Redness, swelling, or pain.  Fluid, blood, or pus.  Keep the dressing dry until your health care provider says it can be removed. Do not take baths, swim, use a hot tub, or do anything that would put your wound underwater until your health care provider approves.  Raise (elevate) the injured area above the level of your heart while you are sitting or lying down.  Do not scratch or pick at the wound.  Keep all follow-up visits as told by your health care provider. This is important. SEEK MEDICAL CARE IF:  You received a tetanus shot and you have swelling, severe pain, redness, or bleeding at the injection site.  You have a fever.  Your pain is not controlled with medicine.  You have increased redness, swelling, or pain at the site of your wound.  You have fluid, blood, or pus coming from your wound.  You notice a bad smell coming from your wound or your dressing. SEEK IMMEDIATE MEDICAL CARE IF:  You have a red streak going away from your wound.   This information is not intended to replace advice given to you by your health care provider. Make sure you discuss any questions you have with  your health care provider.   Document Released: 12/01/2007 Document Revised: 07/08/2014 Document Reviewed: 02/17/2014 Elsevier Interactive Patient Education [employer].

## 2015-04-20 NOTE — Anesthesia Procedure Notes (Signed)
Procedure Name: MAC Date/Time: 04/20/2015 11:03 AM Performed by: Andree Elk, Rashun Grattan A Pre-anesthesia Checklist: Patient identified, Emergency Drugs available, Suction available and Patient being monitored Oxygen Delivery Method: Nasal cannula

## 2015-04-20 NOTE — Anesthesia Postprocedure Evaluation (Signed)
Anesthesia Post Note  Patient: [name]  Procedure(s) Performed: Procedure(s) (LRB): CATARACT EXTRACTION PHACO AND INTRAOCULAR LENS PLACEMENT LEFT EYE CDE=14.92 (Left)  Patient location during evaluation: Short Stay Anesthesia Type: MAC Level of consciousness: awake and alert and oriented Pain management: pain level controlled Vital Signs Assessment: post-procedure vital signs reviewed and stable Respiratory status: spontaneous breathing and respiratory function stable Cardiovascular status: stable Postop Assessment: no signs of nausea or vomiting Anesthetic complications: no    Last Vitals:  Filed Vitals:   04/20/15 1045 04/20/15 1050  BP: 131/66   Pulse:    Temp:    Resp: 19 0    Last Pain: There were no vitals filed for this visit.               ADAMS, AMY A

## 2015-04-21 ENCOUNTER — Encounter (HOSPITAL_COMMUNITY): Payer: Self-pay | Admitting: Ophthalmology

## 2015-05-04 DIAGNOSIS — Z87891 Personal history of nicotine dependence: Secondary | ICD-10-CM | POA: Diagnosis not present

## 2015-05-04 DIAGNOSIS — Z944 Liver transplant status: Secondary | ICD-10-CM | POA: Diagnosis not present

## 2015-05-04 DIAGNOSIS — Z79899 Other long term (current) drug therapy: Secondary | ICD-10-CM | POA: Diagnosis not present

## 2015-05-04 DIAGNOSIS — I2510 Atherosclerotic heart disease of native coronary artery without angina pectoris: Secondary | ICD-10-CM | POA: Diagnosis not present

## 2015-05-04 DIAGNOSIS — N141 Nephropathy induced by other drugs, medicaments and biological substances: Secondary | ICD-10-CM | POA: Diagnosis not present

## 2015-05-04 DIAGNOSIS — Z418 Encounter for other procedures for purposes other than remedying health state: Secondary | ICD-10-CM | POA: Diagnosis not present

## 2015-05-04 DIAGNOSIS — Z794 Long term (current) use of insulin: Secondary | ICD-10-CM | POA: Diagnosis not present

## 2015-05-04 DIAGNOSIS — E1122 Type 2 diabetes mellitus with diabetic chronic kidney disease: Secondary | ICD-10-CM | POA: Diagnosis not present

## 2015-05-04 DIAGNOSIS — Z951 Presence of aortocoronary bypass graft: Secondary | ICD-10-CM | POA: Diagnosis not present

## 2015-05-04 DIAGNOSIS — N183 Chronic kidney disease, stage 3 (moderate): Secondary | ICD-10-CM | POA: Diagnosis not present

## 2015-05-04 DIAGNOSIS — E039 Hypothyroidism, unspecified: Secondary | ICD-10-CM | POA: Diagnosis not present

## 2015-05-14 DIAGNOSIS — E782 Mixed hyperlipidemia: Secondary | ICD-10-CM | POA: Diagnosis not present

## 2015-05-14 DIAGNOSIS — E039 Hypothyroidism, unspecified: Secondary | ICD-10-CM | POA: Diagnosis not present

## 2015-05-14 DIAGNOSIS — E119 Type 2 diabetes mellitus without complications: Secondary | ICD-10-CM | POA: Diagnosis not present

## 2015-05-18 DIAGNOSIS — E1122 Type 2 diabetes mellitus with diabetic chronic kidney disease: Secondary | ICD-10-CM | POA: Diagnosis not present

## 2015-05-18 DIAGNOSIS — E875 Hyperkalemia: Secondary | ICD-10-CM | POA: Diagnosis not present

## 2015-05-18 DIAGNOSIS — E039 Hypothyroidism, unspecified: Secondary | ICD-10-CM | POA: Diagnosis not present

## 2015-05-18 DIAGNOSIS — I2510 Atherosclerotic heart disease of native coronary artery without angina pectoris: Secondary | ICD-10-CM | POA: Diagnosis not present

## 2015-05-18 DIAGNOSIS — N183 Chronic kidney disease, stage 3 (moderate): Secondary | ICD-10-CM | POA: Diagnosis not present

## 2015-06-22 DIAGNOSIS — H35352 Cystoid macular degeneration, left eye: Secondary | ICD-10-CM | POA: Diagnosis not present

## 2015-07-27 DIAGNOSIS — N183 Chronic kidney disease, stage 3 (moderate): Secondary | ICD-10-CM | POA: Diagnosis not present

## 2015-07-27 DIAGNOSIS — Z944 Liver transplant status: Secondary | ICD-10-CM | POA: Diagnosis not present

## 2015-07-27 DIAGNOSIS — H2512 Age-related nuclear cataract, left eye: Secondary | ICD-10-CM | POA: Diagnosis not present

## 2015-07-27 DIAGNOSIS — Z298 Encounter for other specified prophylactic measures: Secondary | ICD-10-CM | POA: Diagnosis not present

## 2015-07-27 DIAGNOSIS — Z961 Presence of intraocular lens: Secondary | ICD-10-CM | POA: Diagnosis not present

## 2015-07-27 DIAGNOSIS — H35352 Cystoid macular degeneration, left eye: Secondary | ICD-10-CM | POA: Diagnosis not present

## 2015-08-21 DIAGNOSIS — Z961 Presence of intraocular lens: Secondary | ICD-10-CM | POA: Diagnosis not present

## 2015-09-01 DIAGNOSIS — R001 Bradycardia, unspecified: Secondary | ICD-10-CM | POA: Diagnosis not present

## 2015-09-01 DIAGNOSIS — N400 Enlarged prostate without lower urinary tract symptoms: Secondary | ICD-10-CM | POA: Diagnosis not present

## 2015-09-01 DIAGNOSIS — E039 Hypothyroidism, unspecified: Secondary | ICD-10-CM | POA: Diagnosis not present

## 2015-09-01 DIAGNOSIS — M65321 Trigger finger, right index finger: Secondary | ICD-10-CM | POA: Diagnosis not present

## 2015-10-20 DIAGNOSIS — D509 Iron deficiency anemia, unspecified: Secondary | ICD-10-CM | POA: Diagnosis not present

## 2015-10-20 DIAGNOSIS — E782 Mixed hyperlipidemia: Secondary | ICD-10-CM | POA: Diagnosis not present

## 2015-10-20 DIAGNOSIS — E1122 Type 2 diabetes mellitus with diabetic chronic kidney disease: Secondary | ICD-10-CM | POA: Diagnosis not present

## 2015-10-20 DIAGNOSIS — E039 Hypothyroidism, unspecified: Secondary | ICD-10-CM | POA: Diagnosis not present

## 2015-10-22 DIAGNOSIS — E875 Hyperkalemia: Secondary | ICD-10-CM | POA: Diagnosis not present

## 2015-10-22 DIAGNOSIS — E039 Hypothyroidism, unspecified: Secondary | ICD-10-CM | POA: Diagnosis not present

## 2015-10-22 DIAGNOSIS — Z944 Liver transplant status: Secondary | ICD-10-CM | POA: Diagnosis not present

## 2015-10-22 DIAGNOSIS — N183 Chronic kidney disease, stage 3 (moderate): Secondary | ICD-10-CM | POA: Diagnosis not present

## 2015-10-22 DIAGNOSIS — I2510 Atherosclerotic heart disease of native coronary artery without angina pectoris: Secondary | ICD-10-CM | POA: Diagnosis not present

## 2015-11-03 DIAGNOSIS — E785 Hyperlipidemia, unspecified: Secondary | ICD-10-CM | POA: Diagnosis not present

## 2015-11-03 DIAGNOSIS — I2510 Atherosclerotic heart disease of native coronary artery without angina pectoris: Secondary | ICD-10-CM | POA: Diagnosis not present

## 2015-11-26 DIAGNOSIS — L570 Actinic keratosis: Secondary | ICD-10-CM | POA: Diagnosis not present

## 2015-11-26 DIAGNOSIS — Z85828 Personal history of other malignant neoplasm of skin: Secondary | ICD-10-CM | POA: Diagnosis not present

## 2015-11-26 DIAGNOSIS — Z1283 Encounter for screening for malignant neoplasm of skin: Secondary | ICD-10-CM | POA: Diagnosis not present

## 2015-11-26 DIAGNOSIS — B078 Other viral warts: Secondary | ICD-10-CM | POA: Diagnosis not present

## 2015-11-26 DIAGNOSIS — Z08 Encounter for follow-up examination after completed treatment for malignant neoplasm: Secondary | ICD-10-CM | POA: Diagnosis not present

## 2015-11-26 DIAGNOSIS — X32XXXD Exposure to sunlight, subsequent encounter: Secondary | ICD-10-CM | POA: Diagnosis not present

## 2015-11-28 DIAGNOSIS — Z48298 Encounter for aftercare following other organ transplant: Secondary | ICD-10-CM | POA: Diagnosis not present

## 2015-11-28 DIAGNOSIS — Z944 Liver transplant status: Secondary | ICD-10-CM | POA: Diagnosis not present

## 2015-11-28 DIAGNOSIS — D899 Disorder involving the immune mechanism, unspecified: Secondary | ICD-10-CM | POA: Diagnosis not present

## 2015-11-28 DIAGNOSIS — E875 Hyperkalemia: Secondary | ICD-10-CM | POA: Diagnosis not present

## 2015-12-21 DIAGNOSIS — Z944 Liver transplant status: Secondary | ICD-10-CM | POA: Diagnosis not present

## 2015-12-21 DIAGNOSIS — D899 Disorder involving the immune mechanism, unspecified: Secondary | ICD-10-CM | POA: Diagnosis not present

## 2015-12-23 DIAGNOSIS — M65331 Trigger finger, right middle finger: Secondary | ICD-10-CM | POA: Diagnosis not present

## 2015-12-23 DIAGNOSIS — M65321 Trigger finger, right index finger: Secondary | ICD-10-CM | POA: Diagnosis not present

## 2015-12-23 DIAGNOSIS — M79641 Pain in right hand: Secondary | ICD-10-CM | POA: Diagnosis not present

## 2016-01-20 DIAGNOSIS — M65331 Trigger finger, right middle finger: Secondary | ICD-10-CM | POA: Diagnosis not present

## 2016-01-20 DIAGNOSIS — M65321 Trigger finger, right index finger: Secondary | ICD-10-CM | POA: Diagnosis not present

## 2016-01-20 DIAGNOSIS — M79641 Pain in right hand: Secondary | ICD-10-CM | POA: Diagnosis not present

## 2016-02-08 DIAGNOSIS — N183 Chronic kidney disease, stage 3 (moderate): Secondary | ICD-10-CM | POA: Diagnosis not present

## 2016-02-08 DIAGNOSIS — Z944 Liver transplant status: Secondary | ICD-10-CM | POA: Diagnosis not present

## 2016-02-08 DIAGNOSIS — D899 Disorder involving the immune mechanism, unspecified: Secondary | ICD-10-CM | POA: Diagnosis not present

## 2016-02-17 DIAGNOSIS — E039 Hypothyroidism, unspecified: Secondary | ICD-10-CM | POA: Diagnosis not present

## 2016-02-17 DIAGNOSIS — E782 Mixed hyperlipidemia: Secondary | ICD-10-CM | POA: Diagnosis not present

## 2016-02-17 DIAGNOSIS — E1122 Type 2 diabetes mellitus with diabetic chronic kidney disease: Secondary | ICD-10-CM | POA: Diagnosis not present

## 2016-02-17 DIAGNOSIS — D509 Iron deficiency anemia, unspecified: Secondary | ICD-10-CM | POA: Diagnosis not present

## 2016-02-19 DIAGNOSIS — Z23 Encounter for immunization: Secondary | ICD-10-CM | POA: Diagnosis not present

## 2016-02-19 DIAGNOSIS — I10 Essential (primary) hypertension: Secondary | ICD-10-CM | POA: Diagnosis not present

## 2016-02-19 DIAGNOSIS — Z944 Liver transplant status: Secondary | ICD-10-CM | POA: Diagnosis not present

## 2016-02-19 DIAGNOSIS — E875 Hyperkalemia: Secondary | ICD-10-CM | POA: Diagnosis not present

## 2016-02-19 DIAGNOSIS — N183 Chronic kidney disease, stage 3 (moderate): Secondary | ICD-10-CM | POA: Diagnosis not present

## 2016-02-19 DIAGNOSIS — E1122 Type 2 diabetes mellitus with diabetic chronic kidney disease: Secondary | ICD-10-CM | POA: Diagnosis not present

## 2016-05-02 DIAGNOSIS — D899 Disorder involving the immune mechanism, unspecified: Secondary | ICD-10-CM | POA: Diagnosis not present

## 2016-05-02 DIAGNOSIS — N183 Chronic kidney disease, stage 3 (moderate): Secondary | ICD-10-CM | POA: Diagnosis not present

## 2016-05-02 DIAGNOSIS — I10 Essential (primary) hypertension: Secondary | ICD-10-CM | POA: Insufficient documentation

## 2016-05-02 DIAGNOSIS — N2581 Secondary hyperparathyroidism of renal origin: Secondary | ICD-10-CM | POA: Insufficient documentation

## 2016-05-03 DIAGNOSIS — H8110 Benign paroxysmal vertigo, unspecified ear: Secondary | ICD-10-CM | POA: Diagnosis not present

## 2016-05-03 DIAGNOSIS — R112 Nausea with vomiting, unspecified: Secondary | ICD-10-CM | POA: Diagnosis not present

## 2016-05-03 DIAGNOSIS — Z944 Liver transplant status: Secondary | ICD-10-CM | POA: Diagnosis not present

## 2016-05-17 DIAGNOSIS — J060 Acute laryngopharyngitis: Secondary | ICD-10-CM | POA: Diagnosis not present

## 2016-05-17 DIAGNOSIS — J0100 Acute maxillary sinusitis, unspecified: Secondary | ICD-10-CM | POA: Diagnosis not present

## 2016-05-17 DIAGNOSIS — R05 Cough: Secondary | ICD-10-CM | POA: Diagnosis not present

## 2016-06-16 DIAGNOSIS — E039 Hypothyroidism, unspecified: Secondary | ICD-10-CM | POA: Diagnosis not present

## 2016-06-16 DIAGNOSIS — E1122 Type 2 diabetes mellitus with diabetic chronic kidney disease: Secondary | ICD-10-CM | POA: Diagnosis not present

## 2016-06-16 DIAGNOSIS — E782 Mixed hyperlipidemia: Secondary | ICD-10-CM | POA: Diagnosis not present

## 2016-06-16 DIAGNOSIS — I10 Essential (primary) hypertension: Secondary | ICD-10-CM | POA: Diagnosis not present

## 2016-06-20 DIAGNOSIS — M65331 Trigger finger, right middle finger: Secondary | ICD-10-CM | POA: Diagnosis not present

## 2016-06-20 DIAGNOSIS — M65321 Trigger finger, right index finger: Secondary | ICD-10-CM | POA: Diagnosis not present

## 2016-07-01 DIAGNOSIS — K7581 Nonalcoholic steatohepatitis (NASH): Secondary | ICD-10-CM | POA: Diagnosis not present

## 2016-07-01 DIAGNOSIS — E1122 Type 2 diabetes mellitus with diabetic chronic kidney disease: Secondary | ICD-10-CM | POA: Diagnosis not present

## 2016-07-01 DIAGNOSIS — E039 Hypothyroidism, unspecified: Secondary | ICD-10-CM | POA: Diagnosis not present

## 2016-07-01 DIAGNOSIS — N183 Chronic kidney disease, stage 3 (moderate): Secondary | ICD-10-CM | POA: Diagnosis not present

## 2016-07-01 DIAGNOSIS — E875 Hyperkalemia: Secondary | ICD-10-CM | POA: Diagnosis not present

## 2016-07-25 DIAGNOSIS — Z944 Liver transplant status: Secondary | ICD-10-CM | POA: Diagnosis not present

## 2016-07-25 DIAGNOSIS — Z298 Encounter for other specified prophylactic measures: Secondary | ICD-10-CM | POA: Diagnosis not present

## 2016-07-28 DIAGNOSIS — L820 Inflamed seborrheic keratosis: Secondary | ICD-10-CM | POA: Diagnosis not present

## 2016-07-28 DIAGNOSIS — X32XXXD Exposure to sunlight, subsequent encounter: Secondary | ICD-10-CM | POA: Diagnosis not present

## 2016-07-28 DIAGNOSIS — L570 Actinic keratosis: Secondary | ICD-10-CM | POA: Diagnosis not present

## 2016-07-29 DIAGNOSIS — Z944 Liver transplant status: Secondary | ICD-10-CM | POA: Diagnosis not present

## 2016-07-29 DIAGNOSIS — D899 Disorder involving the immune mechanism, unspecified: Secondary | ICD-10-CM | POA: Diagnosis not present

## 2016-07-29 DIAGNOSIS — E039 Hypothyroidism, unspecified: Secondary | ICD-10-CM | POA: Diagnosis not present

## 2016-07-29 DIAGNOSIS — Z48298 Encounter for aftercare following other organ transplant: Secondary | ICD-10-CM | POA: Diagnosis not present

## 2016-08-02 DIAGNOSIS — I2510 Atherosclerotic heart disease of native coronary artery without angina pectoris: Secondary | ICD-10-CM | POA: Diagnosis not present

## 2016-08-02 DIAGNOSIS — E876 Hypokalemia: Secondary | ICD-10-CM | POA: Diagnosis not present

## 2016-08-02 DIAGNOSIS — E1122 Type 2 diabetes mellitus with diabetic chronic kidney disease: Secondary | ICD-10-CM | POA: Diagnosis not present

## 2016-08-02 DIAGNOSIS — N183 Chronic kidney disease, stage 3 (moderate): Secondary | ICD-10-CM | POA: Diagnosis not present

## 2016-08-02 DIAGNOSIS — K7581 Nonalcoholic steatohepatitis (NASH): Secondary | ICD-10-CM | POA: Diagnosis not present

## 2016-08-02 DIAGNOSIS — Z944 Liver transplant status: Secondary | ICD-10-CM | POA: Diagnosis not present

## 2016-08-15 DIAGNOSIS — M65321 Trigger finger, right index finger: Secondary | ICD-10-CM | POA: Diagnosis not present

## 2016-08-15 DIAGNOSIS — M65331 Trigger finger, right middle finger: Secondary | ICD-10-CM | POA: Diagnosis not present

## 2016-08-17 DIAGNOSIS — D899 Disorder involving the immune mechanism, unspecified: Secondary | ICD-10-CM | POA: Diagnosis not present

## 2016-08-17 DIAGNOSIS — Z48298 Encounter for aftercare following other organ transplant: Secondary | ICD-10-CM | POA: Diagnosis not present

## 2016-08-17 DIAGNOSIS — Z944 Liver transplant status: Secondary | ICD-10-CM | POA: Diagnosis not present

## 2016-08-26 DIAGNOSIS — M65321 Trigger finger, right index finger: Secondary | ICD-10-CM | POA: Diagnosis not present

## 2016-09-16 DIAGNOSIS — D899 Disorder involving the immune mechanism, unspecified: Secondary | ICD-10-CM | POA: Diagnosis not present

## 2016-09-16 DIAGNOSIS — Z944 Liver transplant status: Secondary | ICD-10-CM | POA: Diagnosis not present

## 2016-10-05 DIAGNOSIS — Z1159 Encounter for screening for other viral diseases: Secondary | ICD-10-CM | POA: Diagnosis not present

## 2016-10-05 DIAGNOSIS — I10 Essential (primary) hypertension: Secondary | ICD-10-CM | POA: Diagnosis not present

## 2016-10-05 DIAGNOSIS — E1122 Type 2 diabetes mellitus with diabetic chronic kidney disease: Secondary | ICD-10-CM | POA: Diagnosis not present

## 2016-10-05 DIAGNOSIS — Z125 Encounter for screening for malignant neoplasm of prostate: Secondary | ICD-10-CM | POA: Diagnosis not present

## 2016-10-12 DIAGNOSIS — I25810 Atherosclerosis of coronary artery bypass graft(s) without angina pectoris: Secondary | ICD-10-CM | POA: Diagnosis not present

## 2016-10-12 DIAGNOSIS — E1122 Type 2 diabetes mellitus with diabetic chronic kidney disease: Secondary | ICD-10-CM | POA: Diagnosis not present

## 2016-10-12 DIAGNOSIS — E875 Hyperkalemia: Secondary | ICD-10-CM | POA: Diagnosis not present

## 2016-10-12 DIAGNOSIS — N183 Chronic kidney disease, stage 3 (moderate): Secondary | ICD-10-CM | POA: Diagnosis not present

## 2016-10-12 DIAGNOSIS — K7581 Nonalcoholic steatohepatitis (NASH): Secondary | ICD-10-CM | POA: Diagnosis not present

## 2016-10-17 DIAGNOSIS — Z944 Liver transplant status: Secondary | ICD-10-CM | POA: Diagnosis not present

## 2016-10-17 DIAGNOSIS — Z48298 Encounter for aftercare following other organ transplant: Secondary | ICD-10-CM | POA: Diagnosis not present

## 2016-10-17 DIAGNOSIS — D899 Disorder involving the immune mechanism, unspecified: Secondary | ICD-10-CM | POA: Diagnosis not present

## 2016-12-01 DIAGNOSIS — X32XXXD Exposure to sunlight, subsequent encounter: Secondary | ICD-10-CM | POA: Diagnosis not present

## 2016-12-01 DIAGNOSIS — L570 Actinic keratosis: Secondary | ICD-10-CM | POA: Diagnosis not present

## 2016-12-09 DIAGNOSIS — I2510 Atherosclerotic heart disease of native coronary artery without angina pectoris: Secondary | ICD-10-CM | POA: Diagnosis not present

## 2016-12-09 DIAGNOSIS — E785 Hyperlipidemia, unspecified: Secondary | ICD-10-CM | POA: Diagnosis not present

## 2016-12-09 DIAGNOSIS — I10 Essential (primary) hypertension: Secondary | ICD-10-CM | POA: Diagnosis not present

## 2016-12-16 DIAGNOSIS — Z23 Encounter for immunization: Secondary | ICD-10-CM | POA: Diagnosis not present

## 2017-01-16 DIAGNOSIS — E782 Mixed hyperlipidemia: Secondary | ICD-10-CM | POA: Diagnosis not present

## 2017-01-16 DIAGNOSIS — I10 Essential (primary) hypertension: Secondary | ICD-10-CM | POA: Diagnosis not present

## 2017-01-16 DIAGNOSIS — E039 Hypothyroidism, unspecified: Secondary | ICD-10-CM | POA: Diagnosis not present

## 2017-01-16 DIAGNOSIS — D509 Iron deficiency anemia, unspecified: Secondary | ICD-10-CM | POA: Diagnosis not present

## 2017-01-16 DIAGNOSIS — E1122 Type 2 diabetes mellitus with diabetic chronic kidney disease: Secondary | ICD-10-CM | POA: Diagnosis not present

## 2017-01-17 DIAGNOSIS — H43391 Other vitreous opacities, right eye: Secondary | ICD-10-CM | POA: Diagnosis not present

## 2017-01-17 DIAGNOSIS — Z961 Presence of intraocular lens: Secondary | ICD-10-CM | POA: Diagnosis not present

## 2017-01-17 DIAGNOSIS — Z9841 Cataract extraction status, right eye: Secondary | ICD-10-CM | POA: Diagnosis not present

## 2017-01-17 DIAGNOSIS — H5203 Hypermetropia, bilateral: Secondary | ICD-10-CM | POA: Diagnosis not present

## 2017-01-18 DIAGNOSIS — N183 Chronic kidney disease, stage 3 (moderate): Secondary | ICD-10-CM | POA: Diagnosis not present

## 2017-01-18 DIAGNOSIS — K7581 Nonalcoholic steatohepatitis (NASH): Secondary | ICD-10-CM | POA: Diagnosis not present

## 2017-01-23 DIAGNOSIS — D899 Disorder involving the immune mechanism, unspecified: Secondary | ICD-10-CM | POA: Diagnosis not present

## 2017-01-23 DIAGNOSIS — Z944 Liver transplant status: Secondary | ICD-10-CM | POA: Diagnosis not present

## 2017-01-23 DIAGNOSIS — R7989 Other specified abnormal findings of blood chemistry: Secondary | ICD-10-CM | POA: Diagnosis not present

## 2017-02-22 DIAGNOSIS — Z48298 Encounter for aftercare following other organ transplant: Secondary | ICD-10-CM | POA: Diagnosis not present

## 2017-02-22 DIAGNOSIS — D899 Disorder involving the immune mechanism, unspecified: Secondary | ICD-10-CM | POA: Diagnosis not present

## 2017-02-22 DIAGNOSIS — Z944 Liver transplant status: Secondary | ICD-10-CM | POA: Diagnosis not present

## 2017-04-24 DIAGNOSIS — E1122 Type 2 diabetes mellitus with diabetic chronic kidney disease: Secondary | ICD-10-CM | POA: Diagnosis not present

## 2017-04-24 DIAGNOSIS — K7581 Nonalcoholic steatohepatitis (NASH): Secondary | ICD-10-CM | POA: Diagnosis not present

## 2017-04-24 DIAGNOSIS — D649 Anemia, unspecified: Secondary | ICD-10-CM | POA: Diagnosis not present

## 2017-04-24 DIAGNOSIS — N400 Enlarged prostate without lower urinary tract symptoms: Secondary | ICD-10-CM | POA: Diagnosis not present

## 2017-04-24 DIAGNOSIS — Z951 Presence of aortocoronary bypass graft: Secondary | ICD-10-CM | POA: Diagnosis not present

## 2017-04-24 DIAGNOSIS — D696 Thrombocytopenia, unspecified: Secondary | ICD-10-CM | POA: Diagnosis not present

## 2017-04-24 DIAGNOSIS — I25810 Atherosclerosis of coronary artery bypass graft(s) without angina pectoris: Secondary | ICD-10-CM | POA: Diagnosis not present

## 2017-04-26 DIAGNOSIS — K7581 Nonalcoholic steatohepatitis (NASH): Secondary | ICD-10-CM | POA: Diagnosis not present

## 2017-04-26 DIAGNOSIS — I25810 Atherosclerosis of coronary artery bypass graft(s) without angina pectoris: Secondary | ICD-10-CM | POA: Diagnosis not present

## 2017-05-01 DIAGNOSIS — N183 Chronic kidney disease, stage 3 (moderate): Secondary | ICD-10-CM | POA: Diagnosis not present

## 2017-05-23 DIAGNOSIS — Z48298 Encounter for aftercare following other organ transplant: Secondary | ICD-10-CM | POA: Diagnosis not present

## 2017-05-23 DIAGNOSIS — D899 Disorder involving the immune mechanism, unspecified: Secondary | ICD-10-CM | POA: Diagnosis not present

## 2017-05-23 DIAGNOSIS — Z944 Liver transplant status: Secondary | ICD-10-CM | POA: Diagnosis not present

## 2017-05-25 DIAGNOSIS — L570 Actinic keratosis: Secondary | ICD-10-CM | POA: Diagnosis not present

## 2017-05-25 DIAGNOSIS — X32XXXD Exposure to sunlight, subsequent encounter: Secondary | ICD-10-CM | POA: Diagnosis not present

## 2017-06-07 DIAGNOSIS — D899 Disorder involving the immune mechanism, unspecified: Secondary | ICD-10-CM | POA: Diagnosis not present

## 2017-06-07 DIAGNOSIS — Z944 Liver transplant status: Secondary | ICD-10-CM | POA: Diagnosis not present

## 2017-06-07 DIAGNOSIS — Z48298 Encounter for aftercare following other organ transplant: Secondary | ICD-10-CM | POA: Diagnosis not present

## 2017-06-21 DIAGNOSIS — D899 Disorder involving the immune mechanism, unspecified: Secondary | ICD-10-CM | POA: Diagnosis not present

## 2017-06-21 DIAGNOSIS — Z944 Liver transplant status: Secondary | ICD-10-CM | POA: Diagnosis not present

## 2017-06-21 DIAGNOSIS — Z48298 Encounter for aftercare following other organ transplant: Secondary | ICD-10-CM | POA: Diagnosis not present

## 2017-06-29 DIAGNOSIS — H26492 Other secondary cataract, left eye: Secondary | ICD-10-CM | POA: Diagnosis not present

## 2017-07-17 DIAGNOSIS — K7581 Nonalcoholic steatohepatitis (NASH): Secondary | ICD-10-CM | POA: Diagnosis not present

## 2017-07-17 DIAGNOSIS — Z944 Liver transplant status: Secondary | ICD-10-CM | POA: Diagnosis not present

## 2017-07-17 DIAGNOSIS — D899 Disorder involving the immune mechanism, unspecified: Secondary | ICD-10-CM | POA: Diagnosis not present

## 2017-07-17 DIAGNOSIS — H9209 Otalgia, unspecified ear: Secondary | ICD-10-CM | POA: Diagnosis not present

## 2017-07-17 DIAGNOSIS — H6120 Impacted cerumen, unspecified ear: Secondary | ICD-10-CM | POA: Diagnosis not present

## 2017-07-21 DIAGNOSIS — K7581 Nonalcoholic steatohepatitis (NASH): Secondary | ICD-10-CM | POA: Diagnosis not present

## 2017-07-21 DIAGNOSIS — H60502 Unspecified acute noninfective otitis externa, left ear: Secondary | ICD-10-CM | POA: Diagnosis not present

## 2017-07-25 ENCOUNTER — Encounter: Payer: Self-pay | Admitting: Internal Medicine

## 2017-08-17 DIAGNOSIS — I25810 Atherosclerosis of coronary artery bypass graft(s) without angina pectoris: Secondary | ICD-10-CM | POA: Diagnosis not present

## 2017-08-17 DIAGNOSIS — K7581 Nonalcoholic steatohepatitis (NASH): Secondary | ICD-10-CM | POA: Diagnosis not present

## 2017-08-17 DIAGNOSIS — H26492 Other secondary cataract, left eye: Secondary | ICD-10-CM | POA: Diagnosis not present

## 2017-08-17 DIAGNOSIS — H60502 Unspecified acute noninfective otitis externa, left ear: Secondary | ICD-10-CM | POA: Diagnosis not present

## 2017-08-17 DIAGNOSIS — D649 Anemia, unspecified: Secondary | ICD-10-CM | POA: Diagnosis not present

## 2017-08-17 DIAGNOSIS — E1122 Type 2 diabetes mellitus with diabetic chronic kidney disease: Secondary | ICD-10-CM | POA: Diagnosis not present

## 2017-08-17 DIAGNOSIS — D696 Thrombocytopenia, unspecified: Secondary | ICD-10-CM | POA: Diagnosis not present

## 2017-08-25 DIAGNOSIS — D899 Disorder involving the immune mechanism, unspecified: Secondary | ICD-10-CM | POA: Diagnosis not present

## 2017-08-25 DIAGNOSIS — Z48298 Encounter for aftercare following other organ transplant: Secondary | ICD-10-CM | POA: Diagnosis not present

## 2017-08-25 DIAGNOSIS — Z944 Liver transplant status: Secondary | ICD-10-CM | POA: Diagnosis not present

## 2017-08-28 DIAGNOSIS — I2510 Atherosclerotic heart disease of native coronary artery without angina pectoris: Secondary | ICD-10-CM | POA: Diagnosis not present

## 2017-08-28 DIAGNOSIS — K7581 Nonalcoholic steatohepatitis (NASH): Secondary | ICD-10-CM | POA: Diagnosis not present

## 2017-08-28 DIAGNOSIS — E782 Mixed hyperlipidemia: Secondary | ICD-10-CM | POA: Diagnosis not present

## 2017-08-28 DIAGNOSIS — E1122 Type 2 diabetes mellitus with diabetic chronic kidney disease: Secondary | ICD-10-CM | POA: Diagnosis not present

## 2017-08-28 DIAGNOSIS — N183 Chronic kidney disease, stage 3 (moderate): Secondary | ICD-10-CM | POA: Diagnosis not present

## 2017-09-28 DIAGNOSIS — X32XXXD Exposure to sunlight, subsequent encounter: Secondary | ICD-10-CM | POA: Diagnosis not present

## 2017-09-28 DIAGNOSIS — L820 Inflamed seborrheic keratosis: Secondary | ICD-10-CM | POA: Diagnosis not present

## 2017-09-28 DIAGNOSIS — L570 Actinic keratosis: Secondary | ICD-10-CM | POA: Diagnosis not present

## 2017-10-24 DIAGNOSIS — D899 Disorder involving the immune mechanism, unspecified: Secondary | ICD-10-CM | POA: Diagnosis not present

## 2017-10-24 DIAGNOSIS — Z48298 Encounter for aftercare following other organ transplant: Secondary | ICD-10-CM | POA: Diagnosis not present

## 2017-10-24 DIAGNOSIS — Z944 Liver transplant status: Secondary | ICD-10-CM | POA: Diagnosis not present

## 2017-10-27 DIAGNOSIS — Z944 Liver transplant status: Secondary | ICD-10-CM | POA: Diagnosis not present

## 2017-10-27 DIAGNOSIS — Z298 Encounter for other specified prophylactic measures: Secondary | ICD-10-CM | POA: Diagnosis not present

## 2017-11-17 DIAGNOSIS — M11261 Other chondrocalcinosis, right knee: Secondary | ICD-10-CM | POA: Diagnosis not present

## 2017-11-17 DIAGNOSIS — M25561 Pain in right knee: Secondary | ICD-10-CM | POA: Diagnosis not present

## 2017-11-21 ENCOUNTER — Ambulatory Visit: Payer: Medicare Other | Admitting: Internal Medicine

## 2017-11-21 ENCOUNTER — Encounter: Payer: Self-pay | Admitting: Internal Medicine

## 2017-11-21 ENCOUNTER — Other Ambulatory Visit: Payer: Self-pay

## 2017-11-21 VITALS — BP 149/92 | HR 75 | Temp 97.6°F | Ht 67.0 in | Wt 173.4 lb

## 2017-11-21 DIAGNOSIS — Z1211 Encounter for screening for malignant neoplasm of colon: Secondary | ICD-10-CM

## 2017-11-21 DIAGNOSIS — Z1212 Encounter for screening for malignant neoplasm of rectum: Secondary | ICD-10-CM | POA: Diagnosis not present

## 2017-11-21 MED ORDER — CLENPIQ 10-3.5-12 MG-GM -GM/160ML PO SOLN: 1.0000 | Freq: Once | ORAL | 0 refills | Status: AC

## 2017-11-21 NOTE — Progress Notes (Signed)
Primary Care Physician:  Celene Squibb, MD Primary Gastroenterologist:  Dr. Gala Romney  Pre-Procedure History & Physical: HPI:  [name] is a 72 y.o. male is here for a screening colonoscopy. Last colonoscopy almost 9 years ago-essentially negative. No lower GI tract symptoms. Doing well now several  years out from orthotopic liver transplant.  Followed closely by Dr. Loletha Grayer at [facility]. There is no family history of colon cancer.  Past Medical History:  Diagnosis Date  . Anemia    thrombocytopenia  . Atherosclerosis   . BPH (benign prostatic hyperplasia)   . Cirrhosis of liver ([place])    PT HAS HAD A LIVER TRANSPLANT-NASH, vaccinated for HepA/B on 05/31/11 afp 1.6, thrombus in splenic vein per ct  . Coronary artery disease 2004   s/p CAGB  . DM (diabetes mellitus) ([place])   . Erectile dysfunction   . Esophageal varices (HCC)     EGD 05/13/10, three columns grade II to III EV s/p banding, second banding since 01/2010.>Portal gastropathy.. >5 banding sessions in the past.  . GERD (gastroesophageal reflux disease)   . Gynecomastia, male    RIGHT  . History of transfusion of whole blood   . Hyperlipidemia   . Hypertension   . Hypothyroidism   . Overactive bladder   . Spleen enlarged   . Thrombocytopenia ([place])   . Thrombus    Chronic splenic, portal and smv    Past Surgical History:  Procedure Laterality Date  . CARDIAC CATHETERIZATION  10/09/06  . CATARACT EXTRACTION W/PHACO Left 04/20/2015   Procedure: CATARACT EXTRACTION PHACO AND INTRAOCULAR LENS PLACEMENT LEFT EYE CDE=14.92;  Surgeon: Tonny Branch, MD;  Location: [facility];  Service: Ophthalmology;  Laterality: Left;  . COLONOSCOPY  01/2010   portal colopathy, sigmoid AVM (nonbleeding)  . CORONARY ARTERY BYPASS GRAFT  2004   3 vessels  . ESOPHAGOGASTRODUODENOSCOPY  01/2010   4 COLUMNS 2-3 GRADE ESOPHAGEAL VARICES,S/P BANDING,PORTAL GASTROPATHY  . ESOPHAGOGASTRODUODENOSCOPY  June 2012   Dr. Journii Nierman-->3 columns of grade 1 esophageal  varices with overlying scar, no banding treatment required, portal gastropathy. Next EGD in December 2013  . ESOPHAGOGASTRODUODENOSCOPY N/A 04/16/2012   Dr. Gala Romney: 3 columns Grade 2-3 esophageal varices, portal gastropathy, gastric antral vascular ectasia  . LIVER TRANSPLANT  01/2013  . ROTATOR CUFF REPAIR Left     Prior to Admission medications   Medication Sig Start Date End Date Taking? Authorizing Provider  aspirin 81 MG tablet Take 81 mg by mouth daily.   Yes [provider]  atorvastatin (LIPITOR) 10 MG tablet Take 10 mg by mouth every morning.    Yes [provider]  calcium carbonate (OS-CAL - DOSED IN MG OF ELEMENTAL CALCIUM) 1250 MG tablet Take 2 tablets by mouth daily.    Yes [provider]  carvedilol (COREG) 6.25 MG tablet Take 6.25 mg by mouth at bedtime.    Yes [provider]  CycloSPORINE Modified (GENGRAF PO) Take 150 mg by mouth 2 (two) times daily.    Yes [provider]  hydrochlorothiazide (HYDRODIURIL) 25 MG tablet Take 25 mg by mouth daily.    Yes [provider]  insulin glargine (LANTUS) 100 UNIT/ML injection Inject 14 Units into the skin at bedtime.    Yes [provider]  levothyroxine (SYNTHROID, LEVOTHROID) 137 MCG tablet Take 125 mcg by mouth daily before breakfast.    Yes [provider]  Magnesium Oxide (MAGOX 400 PO) Take 400 mg by mouth 3 (three) times daily.  Yes [provider]  Mycophenolate Mofetil (CELLCEPT PO) Take 500 mg by mouth 2 (two) times daily.    Yes [provider]  omeprazole (PRILOSEC) 20 MG capsule Take 20 mg by mouth daily.    [provider]  tamsulosin (FLOMAX) 0.4 MG CAPS capsule Take 0.4 mg by mouth daily.    [provider]    Allergies as of 11/21/2017  . (No Known Allergies)    Family History  Problem Relation Age of Onset  . Stroke Father 11  . Heart disease Mother 41       CHF  . Heart attack Brother   . Liver  disease Neg Hx   . Colon cancer Neg Hx     Social History   Socioeconomic History  . Marital status: Married    Spouse name: Not on file  . Number of children: 4  . Years of education: Not on file  . Highest education level: Not on file  Occupational History    Employer: [employer]  Social Needs  . Financial resource strain: Not on file  . Food insecurity:    Worry: Not on file    Inability: Not on file  . Transportation needs:    Medical: Not on file    Non-medical: Not on file  Tobacco Use  . Smoking status: Former Smoker    Packs/day: 2.00    Years: 20.00    Pack years: 40.00    Types: Cigarettes    Last attempt to quit: 04/14/1974    Years since quitting: 43.6  . Smokeless tobacco: Never Used  . Tobacco comment: Quit smoking at age 81  Substance and Sexual Activity  . Alcohol use: No  . Drug use: No  . Sexual activity: Not Currently  Lifestyle  . Physical activity:    Days per week: Not on file    Minutes per session: Not on file  . Stress: Not on file  Relationships  . Social connections:    Talks on phone: Not on file    Gets together: Not on file    Attends religious service: Not on file    Active member of club or organization: Not on file    Attends meetings of clubs or organizations: Not on file    Relationship status: Not on file  . Intimate partner violence:    Fear of current or ex partner: Not on file    Emotionally abused: Not on file    Physically abused: Not on file    Forced sexual activity: Not on file  Other Topics Concern  . Not on file  Social History Narrative  . Not on file    Review of Systems: See HPI, otherwise negative ROS  Physical Exam: BP (!) 149/92   Pulse 75   Temp 97.6 F (36.4 C) (Oral)   Ht 5\' 7"  (1.702 m)   Wt 173 lb 6.4 oz (78.7 kg)   BMI 27.16 kg/m  General:   Alert,  Well-developed, well-nourished, pleasant and cooperative in NAD Lungs:  Clear throughout to auscultation.   No wheezes,  crackles, or rhonchi. No acute distress. Heart:  Regular rate and rhythm; no murmurs, clicks, rubs,  or gallops. Abdomen:  Soft, nontender and nondistended. No masses, hepatosplenomegaly or hernias noted. Normal bowel sounds, without guarding, and without rebound.    Impression/Plan: [name] is now here to undergo a screening colonoscopy.  Average risk screening examination.  Risks, benefits, limitations, imponderables and alternatives  regarding colonoscopy have been reviewed with the patient. Questions have been answered. All parties agreeable.     Notice:  This dictation was prepared with Dragon dictation along with smaller phrase technology. Any transcriptional errors that result from this process are unintentional and may not be corrected upon review.

## 2017-11-21 NOTE — H&P (View-Only) (Signed)
Primary Care Physician:  Celene Squibb, MD Primary Gastroenterologist:  Dr. Gala Romney  Pre-Procedure History & Physical: HPI:  [name] is a 72 y.o. male is here for a screening colonoscopy. Last colonoscopy almost 9 years ago-essentially negative. No lower GI tract symptoms. Doing well now several  years out from orthotopic liver transplant.  Followed closely by Dr. Loletha Grayer at [facility]. There is no family history of colon cancer.  Past Medical History:  Diagnosis Date  . Anemia    thrombocytopenia  . Atherosclerosis   . BPH (benign prostatic hyperplasia)   . Cirrhosis of liver ([place])    PT HAS HAD A LIVER TRANSPLANT-NASH, vaccinated for HepA/B on 05/31/11 afp 1.6, thrombus in splenic vein per ct  . Coronary artery disease 2004   s/p CAGB  . DM (diabetes mellitus) ([place])   . Erectile dysfunction   . Esophageal varices (HCC)     EGD 05/13/10, three columns grade II to III EV s/p banding, second banding since 01/2010.>Portal gastropathy.. >5 banding sessions in the past.  . GERD (gastroesophageal reflux disease)   . Gynecomastia, male    RIGHT  . History of transfusion of whole blood   . Hyperlipidemia   . Hypertension   . Hypothyroidism   . Overactive bladder   . Spleen enlarged   . Thrombocytopenia ([place])   . Thrombus    Chronic splenic, portal and smv    Past Surgical History:  Procedure Laterality Date  . CARDIAC CATHETERIZATION  10/09/06  . CATARACT EXTRACTION W/PHACO Left 04/20/2015   Procedure: CATARACT EXTRACTION PHACO AND INTRAOCULAR LENS PLACEMENT LEFT EYE CDE=14.92;  Surgeon: Tonny Branch, MD;  Location: [facility];  Service: Ophthalmology;  Laterality: Left;  . COLONOSCOPY  01/2010   portal colopathy, sigmoid AVM (nonbleeding)  . CORONARY ARTERY BYPASS GRAFT  2004   3 vessels  . ESOPHAGOGASTRODUODENOSCOPY  01/2010   4 COLUMNS 2-3 GRADE ESOPHAGEAL VARICES,S/P BANDING,PORTAL GASTROPATHY  . ESOPHAGOGASTRODUODENOSCOPY  June 2012   Dr. Dontrel Smethers-->3 columns of grade 1 esophageal  varices with overlying scar, no banding treatment required, portal gastropathy. Next EGD in December 2013  . ESOPHAGOGASTRODUODENOSCOPY N/A 04/16/2012   Dr. Gala Romney: 3 columns Grade 2-3 esophageal varices, portal gastropathy, gastric antral vascular ectasia  . LIVER TRANSPLANT  01/2013  . ROTATOR CUFF REPAIR Left     Prior to Admission medications   Medication Sig Start Date End Date Taking? Authorizing Provider  aspirin 81 MG tablet Take 81 mg by mouth daily.   Yes [provider]  atorvastatin (LIPITOR) 10 MG tablet Take 10 mg by mouth every morning.    Yes [provider]  calcium carbonate (OS-CAL - DOSED IN MG OF ELEMENTAL CALCIUM) 1250 MG tablet Take 2 tablets by mouth daily.    Yes [provider]  carvedilol (COREG) 6.25 MG tablet Take 6.25 mg by mouth at bedtime.    Yes [provider]  CycloSPORINE Modified (GENGRAF PO) Take 150 mg by mouth 2 (two) times daily.    Yes [provider]  hydrochlorothiazide (HYDRODIURIL) 25 MG tablet Take 25 mg by mouth daily.    Yes [provider]  insulin glargine (LANTUS) 100 UNIT/ML injection Inject 14 Units into the skin at bedtime.    Yes [provider]  levothyroxine (SYNTHROID, LEVOTHROID) 137 MCG tablet Take 125 mcg by mouth daily before breakfast.    Yes [provider]  Magnesium Oxide (MAGOX 400 PO) Take 400 mg by mouth 3 (three) times daily.  Yes [provider]  Mycophenolate Mofetil (CELLCEPT PO) Take 500 mg by mouth 2 (two) times daily.    Yes [provider]  omeprazole (PRILOSEC) 20 MG capsule Take 20 mg by mouth daily.    [provider]  tamsulosin (FLOMAX) 0.4 MG CAPS capsule Take 0.4 mg by mouth daily.    [provider]    Allergies as of 11/21/2017  . (No Known Allergies)    Family History  Problem Relation Age of Onset  . Stroke Father 65  . Heart disease Mother 7       CHF  . Heart attack Brother   . Liver  disease Neg Hx   . Colon cancer Neg Hx     Social History   Socioeconomic History  . Marital status: Married    Spouse name: Not on file  . Number of children: 4  . Years of education: Not on file  . Highest education level: Not on file  Occupational History    Employer: [employer]  Social Needs  . Financial resource strain: Not on file  . Food insecurity:    Worry: Not on file    Inability: Not on file  . Transportation needs:    Medical: Not on file    Non-medical: Not on file  Tobacco Use  . Smoking status: Former Smoker    Packs/day: 2.00    Years: 20.00    Pack years: 40.00    Types: Cigarettes    Last attempt to quit: 04/14/1974    Years since quitting: 43.6  . Smokeless tobacco: Never Used  . Tobacco comment: Quit smoking at age 14  Substance and Sexual Activity  . Alcohol use: No  . Drug use: No  . Sexual activity: Not Currently  Lifestyle  . Physical activity:    Days per week: Not on file    Minutes per session: Not on file  . Stress: Not on file  Relationships  . Social connections:    Talks on phone: Not on file    Gets together: Not on file    Attends religious service: Not on file    Active member of club or organization: Not on file    Attends meetings of clubs or organizations: Not on file    Relationship status: Not on file  . Intimate partner violence:    Fear of current or ex partner: Not on file    Emotionally abused: Not on file    Physically abused: Not on file    Forced sexual activity: Not on file  Other Topics Concern  . Not on file  Social History Narrative  . Not on file    Review of Systems: See HPI, otherwise negative ROS  Physical Exam: BP (!) 149/92   Pulse 75   Temp 97.6 F (36.4 C) (Oral)   Ht 5\' 7"  (1.702 m)   Wt 173 lb 6.4 oz (78.7 kg)   BMI 27.16 kg/m  General:   Alert,  Well-developed, well-nourished, pleasant and cooperative in NAD Lungs:  Clear throughout to auscultation.   No wheezes,  crackles, or rhonchi. No acute distress. Heart:  Regular rate and rhythm; no murmurs, clicks, rubs,  or gallops. Abdomen:  Soft, nontender and nondistended. No masses, hepatosplenomegaly or hernias noted. Normal bowel sounds, without guarding, and without rebound.    Impression/Plan: [name] is now here to undergo a screening colonoscopy.  Average risk screening examination.  Risks, benefits, limitations, imponderables and alternatives  regarding colonoscopy have been reviewed with the patient. Questions have been answered. All parties agreeable.     Notice:  This dictation was prepared with Dragon dictation along with smaller phrase technology. Any transcriptional errors that result from this process are unintentional and may not be corrected upon review.

## 2017-11-21 NOTE — Patient Instructions (Signed)
Pt stated he takes Lantus 14 units in the mornings. RMR advised for him to take Lantus 10 units the morning before TCS and none the morning of TCS. Noted on his procedure instructions.

## 2017-11-21 NOTE — Patient Instructions (Addendum)
Schedule screening colonoscopy - propofol  Further recommendations to follow

## 2017-11-22 ENCOUNTER — Telehealth: Payer: Self-pay

## 2017-11-22 NOTE — Telephone Encounter (Signed)
Called and informed pt of pre-op appt 12/14/17 at 11:00am. Letter mailed.

## 2017-12-12 DIAGNOSIS — I493 Ventricular premature depolarization: Secondary | ICD-10-CM | POA: Diagnosis not present

## 2017-12-12 DIAGNOSIS — I255 Ischemic cardiomyopathy: Secondary | ICD-10-CM | POA: Diagnosis not present

## 2017-12-12 DIAGNOSIS — R008 Other abnormalities of heart beat: Secondary | ICD-10-CM | POA: Diagnosis not present

## 2017-12-12 DIAGNOSIS — R002 Palpitations: Secondary | ICD-10-CM | POA: Diagnosis not present

## 2017-12-12 DIAGNOSIS — I517 Cardiomegaly: Secondary | ICD-10-CM | POA: Diagnosis not present

## 2017-12-12 DIAGNOSIS — I340 Nonrheumatic mitral (valve) insufficiency: Secondary | ICD-10-CM | POA: Diagnosis not present

## 2017-12-12 DIAGNOSIS — I519 Heart disease, unspecified: Secondary | ICD-10-CM | POA: Diagnosis not present

## 2017-12-12 DIAGNOSIS — Z87891 Personal history of nicotine dependence: Secondary | ICD-10-CM | POA: Diagnosis not present

## 2017-12-12 DIAGNOSIS — R9431 Abnormal electrocardiogram [ECG] [EKG]: Secondary | ICD-10-CM | POA: Diagnosis not present

## 2017-12-12 DIAGNOSIS — I2510 Atherosclerotic heart disease of native coronary artery without angina pectoris: Secondary | ICD-10-CM | POA: Diagnosis not present

## 2017-12-12 NOTE — Patient Instructions (Signed)
[name]  12/12/2017     @PREFPERIOPPHARMACY @   Your procedure is scheduled on  12/21/2017 .  Report to [facility] at  615   A.M.  Call this number if you have problems the morning of surgery:  [phone]   Remember:  Do not eat or drink after midnight.  You may drink clear liquids until  (follow the instructions given to you).  Clear liquids allowed are:                    Water, Juice (non-citric and without pulp), Carbonated beverages, Clear Tea, Black Coffee only, Plain Jell-O only, Gatorade and Plain Popsicles only    Take these medicines the morning of surgery with A SIP OF WATER  Gengraf, levothyroxine, cellcept, prilosec, flomax.    Do not wear jewelry, make-up or nail polish.  Do not wear lotions, powders, or perfumes, or deodorant.  Do not shave 48 hours prior to surgery.  Men may shave face and neck.  Do not bring valuables to the hospital.  [facility] is not responsible for any belongings or valuables.  Contacts, dentures or bridgework may not be worn into surgery.  Leave your suitcase in the car.  After surgery it may be brought to your room.  For patients admitted to the hospital, discharge time will be determined by your treatment team.  Patients discharged the day of surgery will not be allowed to drive home.   Name and phone number of your driver:   family Special instructions:  Follow the diet and prep instructions given to you by Dr Roseanne Kaufman office.  Please read over the following fact sheets that you were given. Anesthesia Post-op Instructions and Care and Recovery After Surgery       Colonoscopy, Adult A colonoscopy is an exam to look at the large intestine. It is done to check for problems, such as:  Lumps (tumors).  Growths (polyps).  Swelling (inflammation).  Bleeding.  What happens before the procedure? Eating and drinking Follow instructions from your doctor about eating and drinking. These instructions may  include:  A few days before the procedure - follow a low-fiber diet. ? Avoid nuts. ? Avoid seeds. ? Avoid dried fruit. ? Avoid raw fruits. ? Avoid vegetables.  1-3 days before the procedure - follow a clear liquid diet. Avoid liquids that have red or purple dye. Drink only clear liquids, such as: ? Clear broth or bouillon. ? Black coffee or tea. ? Clear juice. ? Clear soft drinks or sports drinks. ? Gelatin dessert. ? Popsicles.  On the day of the procedure - do not eat or drink anything during the 2 hours before the procedure.  Bowel prep If you were prescribed an oral bowel prep:  Take it as told by your doctor. Starting the day before your procedure, you will need to drink a lot of liquid. The liquid will cause you to poop (have bowel movements) until your poop is almost clear or light green.  If your skin or butt gets irritated from diarrhea, you may: ? Wipe the area with wipes that have medicine in them, such as adult wet wipes with aloe and vitamin E. ? Put something on your skin that soothes the area, such as petroleum jelly.  If you throw up (vomit) while drinking the bowel prep, take a break for up to 60 minutes. Then begin the bowel prep again. If you keep  throwing up and you cannot take the bowel prep without throwing up, call your doctor.  General instructions  Ask your doctor about changing or stopping your normal medicines. This is important if you take diabetes medicines or blood thinners.  Plan to have someone take you home from the hospital or clinic. What happens during the procedure?  An IV tube may be put into one of your veins.  You will be given medicine to help you relax (sedative).  To reduce your risk of infection: ? Your doctors will wash their hands. ? Your anal area will be washed with soap.  You will be asked to lie on your side with your knees bent.  Your doctor will get a long, thin, flexible tube ready. The tube will have a camera and a  light on the end.  The tube will be put into your anus.  The tube will be gently put into your large intestine.  Air will be delivered into your large intestine to keep it open. You may feel some pressure or cramping.  The camera will be used to take photos.  A small tissue sample may be removed from your body to be looked at under a microscope (biopsy). If any possible problems are found, the tissue will be sent to a lab for testing.  If small growths are found, your doctor may remove them and have them checked for cancer.  The tube that was put into your anus will be slowly removed. The procedure may vary among doctors and hospitals. What happens after the procedure?  Your doctor will check on you often until the medicines you were given have worn off.  Do not drive for 24 hours after the procedure.  You may have a small amount of blood in your poop.  You may pass gas.  You may have mild cramps or bloating in your belly (abdomen).  It is up to you to get the results of your procedure. Ask your doctor, or the department performing the procedure, when your results will be ready. This information is not intended to replace advice given to you by your health care provider. Make sure you discuss any questions you have with your health care provider. Document Released: 03/26/2010 Document Revised: 12/23/2015 Document Reviewed: 05/05/2015 Elsevier Interactive Patient Education  2017 [employer]  Colonoscopy, Adult, Care After This sheet gives you information about how to care for yourself after your procedure. Your health care provider may also give you more specific instructions. If you have problems or questions, contact your health care provider. What can I expect after the procedure? After the procedure, it is common to have:  A small amount of blood in your stool for 24 hours after the procedure.  Some gas.  Mild abdominal cramping or bloating.  Follow these  instructions at home: General instructions   For the first 24 hours after the procedure: ? Do not drive or use machinery. ? Do not sign important documents. ? Do not drink alcohol. ? Do your regular daily activities at a slower pace than normal. ? Eat soft, easy-to-digest foods. ? Rest often.  Take over-the-counter or prescription medicines only as told by your health care provider.  It is up to you to get the results of your procedure. Ask your health care provider, or the department performing the procedure, when your results will be ready. Relieving cramping and bloating  Try walking around when you have cramps or feel bloated.  Apply heat to your  abdomen as told by your health care provider. Use a heat source that your health care provider recommends, such as a moist heat pack or a heating pad. ? Place a towel between your skin and the heat source. ? Leave the heat on for 20-30 minutes. ? Remove the heat if your skin turns bright red. This is especially important if you are unable to feel pain, heat, or cold. You may have a greater risk of getting burned. Eating and drinking  Drink enough fluid to keep your urine clear or pale yellow.  Resume your normal diet as instructed by your health care provider. Avoid heavy or fried foods that are hard to digest.  Avoid drinking alcohol for as long as instructed by your health care provider. Contact a health care provider if:  You have blood in your stool 2-3 days after the procedure. Get help right away if:  You have more than a small spotting of blood in your stool.  You pass large blood clots in your stool.  Your abdomen is swollen.  You have nausea or vomiting.  You have a fever.  You have increasing abdominal pain that is not relieved with medicine. This information is not intended to replace advice given to you by your health care provider. Make sure you discuss any questions you have with your health care  provider. Document Released: 10/06/2003 Document Revised: 11/16/2015 Document Reviewed: 05/05/2015 Elsevier Interactive Patient Education  2018 [place] Anesthesia is a term that refers to techniques, procedures, and medicines that help a person stay safe and comfortable during a medical procedure. Monitored anesthesia care, or sedation, is one type of anesthesia. Your anesthesia specialist may recommend sedation if you will be having a procedure that does not require you to be unconscious, such as:  Cataract surgery.  A dental procedure.  A biopsy.  A colonoscopy.  During the procedure, you may receive a medicine to help you relax (sedative). There are three levels of sedation:  Mild sedation. At this level, you may feel awake and relaxed. You will be able to follow directions.  Moderate sedation. At this level, you will be sleepy. You may not remember the procedure.  Deep sedation. At this level, you will be asleep. You will not remember the procedure.  The more medicine you are given, the deeper your level of sedation will be. Depending on how you respond to the procedure, the anesthesia specialist may change your level of sedation or the type of anesthesia to fit your needs. An anesthesia specialist will monitor you closely during the procedure. Let your health care provider know about:  Any allergies you have.  All medicines you are taking, including vitamins, herbs, eye drops, creams, and over-the-counter medicines.  Any use of steroids (by mouth or as a cream).  Any problems you or family members have had with sedatives and anesthetic medicines.  Any blood disorders you have.  Any surgeries you have had.  Any medical conditions you have, such as sleep apnea.  Whether you are pregnant or may be pregnant.  Any use of cigarettes, alcohol, or street drugs. What are the risks? Generally, this is a safe procedure. However, problems may  occur, including:  Getting too much medicine (oversedation).  Nausea.  Allergic reaction to medicines.  Trouble breathing. If this happens, a breathing tube may be used to help with breathing. It will be removed when you are awake and breathing on your own.  Heart trouble.  Lung trouble.  Before the procedure Staying hydrated Follow instructions from your health care provider about hydration, which may include:  Up to 2 hours before the procedure - you may continue to drink clear liquids, such as water, clear fruit juice, black coffee, and plain tea.  Eating and drinking restrictions Follow instructions from your health care provider about eating and drinking, which may include:  8 hours before the procedure - stop eating heavy meals or foods such as meat, fried foods, or fatty foods.  6 hours before the procedure - stop eating light meals or foods, such as toast or cereal.  6 hours before the procedure - stop drinking milk or drinks that contain milk.  2 hours before the procedure - stop drinking clear liquids.  Medicines Ask your health care provider about:  Changing or stopping your regular medicines. This is especially important if you are taking diabetes medicines or blood thinners.  Taking medicines such as aspirin and ibuprofen. These medicines can thin your blood. Do not take these medicines before your procedure if your health care provider instructs you not to.  Tests and exams  You will have a physical exam.  You may have blood tests done to show: ? How well your kidneys and liver are working. ? How well your blood can clot.  General instructions  Plan to have someone take you home from the hospital or clinic.  If you will be going home right after the procedure, plan to have someone with you for 24 hours.  What happens during the procedure?  Your blood pressure, heart rate, breathing, level of pain and overall condition will be monitored.  An IV  tube will be inserted into one of your veins.  Your anesthesia specialist will give you medicines as needed to keep you comfortable during the procedure. This may mean changing the level of sedation.  The procedure will be performed. After the procedure  Your blood pressure, heart rate, breathing rate, and blood oxygen level will be monitored until the medicines you were given have worn off.  Do not drive for 24 hours if you received a sedative.  You may: ? Feel sleepy, clumsy, or nauseous. ? Feel forgetful about what happened after the procedure. ? Have a sore throat if you had a breathing tube during the procedure. ? Vomit. This information is not intended to replace advice given to you by your health care provider. Make sure you discuss any questions you have with your health care provider. Document Released: 11/17/2004 Document Revised: 07/31/2015 Document Reviewed: 06/14/2015 Elsevier Interactive Patient Education  2018 [place], Care After These instructions provide you with information about caring for yourself after your procedure. Your health care provider may also give you more specific instructions. Your treatment has been planned according to current medical practices, but problems sometimes occur. Call your health care provider if you have any problems or questions after your procedure. What can I expect after the procedure? After your procedure, it is common to:  Feel sleepy for several hours.  Feel clumsy and have poor balance for several hours.  Feel forgetful about what happened after the procedure.  Have poor judgment for several hours.  Feel nauseous or vomit.  Have a sore throat if you had a breathing tube during the procedure.  Follow these instructions at home: For at least 24 hours after the procedure:   Do not: ? Participate in activities in which you could fall or become injured. ?  Drive. ? Use heavy  machinery. ? Drink alcohol. ? Take sleeping pills or medicines that cause drowsiness. ? Make important decisions or sign legal documents. ? Take care of children on your own.  Rest. Eating and drinking  Follow the diet that is recommended by your health care provider.  If you vomit, drink water, juice, or soup when you can drink without vomiting.  Make sure you have little or no nausea before eating solid foods. General instructions  Have a responsible adult stay with you until you are awake and alert.  Take over-the-counter and prescription medicines only as told by your health care provider.  If you smoke, do not smoke without supervision.  Keep all follow-up visits as told by your health care provider. This is important. Contact a health care provider if:  You keep feeling nauseous or you keep vomiting.  You feel light-headed.  You develop a rash.  You have a fever. Get help right away if:  You have trouble breathing. This information is not intended to replace advice given to you by your health care provider. Make sure you discuss any questions you have with your health care provider. Document Released: 06/14/2015 Document Revised: 10/14/2015 Document Reviewed: 06/14/2015 Elsevier Interactive Patient Education  [employer].

## 2017-12-14 ENCOUNTER — Encounter (HOSPITAL_COMMUNITY)
Admission: RE | Admit: 2017-12-14 | Discharge: 2017-12-14 | Disposition: A | Discharge status: Home / Self Care | Payer: Medicare Other | Source: Ambulatory Visit | Attending: Internal Medicine | Admitting: Internal Medicine

## 2017-12-14 ENCOUNTER — Encounter (HOSPITAL_COMMUNITY): Payer: Self-pay

## 2017-12-14 ENCOUNTER — Other Ambulatory Visit: Payer: Self-pay

## 2017-12-14 DIAGNOSIS — Z01818 Encounter for other preprocedural examination: Secondary | ICD-10-CM | POA: Diagnosis not present

## 2017-12-14 DIAGNOSIS — I493 Ventricular premature depolarization: Secondary | ICD-10-CM | POA: Insufficient documentation

## 2017-12-14 HISTORY — DX: Cardiomegaly: I51.7

## 2017-12-14 LAB — CBC WITH DIFFERENTIAL/PLATELET
Abs Immature Granulocytes: 0.03 10*3/uL (ref 0.00–0.07)
Basophils Absolute: 0 10*3/uL (ref 0.0–0.1)
Basophils Relative: 0 %
EOS ABS: 0.1 10*3/uL (ref 0.0–0.5)
Eosinophils Relative: 2 %
HEMATOCRIT: 44.2 % (ref 39.0–52.0)
Hemoglobin: 14.5 g/dL (ref 13.0–17.0)
IMMATURE GRANULOCYTES: 1 %
LYMPHS ABS: 1 10*3/uL (ref 0.7–4.0)
Lymphocytes Relative: 19 %
MCH: 30.7 pg (ref 26.0–34.0)
MCHC: 32.8 g/dL (ref 30.0–36.0)
MCV: 93.4 fL (ref 80.0–100.0)
MONO ABS: 0.5 10*3/uL (ref 0.1–1.0)
MONOS PCT: 10 %
NEUTROS PCT: 68 %
Neutro Abs: 3.6 10*3/uL (ref 1.7–7.7)
Platelets: 171 10*3/uL (ref 150–400)
RBC: 4.73 MIL/uL (ref 4.22–5.81)
RDW: 13.3 % (ref 11.5–15.5)
WBC: 5.3 10*3/uL (ref 4.0–10.5)
nRBC: 0 % (ref 0.0–0.2)

## 2017-12-14 LAB — BASIC METABOLIC PANEL
ANION GAP: 7 (ref 5–15)
BUN: 35 mg/dL — ABNORMAL HIGH (ref 8–23)
CALCIUM: 9.6 mg/dL (ref 8.9–10.3)
CO2: 26 mmol/L (ref 22–32)
Chloride: 105 mmol/L (ref 98–111)
Creatinine, Ser: 1.56 mg/dL — ABNORMAL HIGH (ref 0.61–1.24)
GFR calc Af Amer: 49 mL/min — ABNORMAL LOW (ref >60)
GFR calc non Af Amer: 43 mL/min — ABNORMAL LOW (ref >60)
GLUCOSE: 127 mg/dL — AB (ref 70–99)
POTASSIUM: 5.1 mmol/L (ref 3.5–5.1)
Sodium: 138 mmol/L (ref 135–145)

## 2017-12-14 NOTE — Pre-Procedure Instructions (Signed)
Patient in for PAT and monitor shows HR of 25-33. EKG done and shows bigeminy with rate of 66. Patient states he was just at [facility] seeing his cardiologist and Echo was done. He has also been on a  holter tmonitor until this morning, which he states they placed him on because he was having "more irregularites than normall in his heart".His EKG at [facility] showed SR with trigeminy. Ekg and history discussed with Dr Hilaria Ota who states he would proceed with TCS as scheduled but to instruct patient that if his cardiologist calls back with results of holter monitor and wants patient to have any thing else done in reference to his heart, to call us because we would postpone his TCS until that would be done. Patient verbalized understanding of this.

## 2017-12-15 DIAGNOSIS — R002 Palpitations: Secondary | ICD-10-CM | POA: Diagnosis not present

## 2017-12-15 DIAGNOSIS — I255 Ischemic cardiomyopathy: Secondary | ICD-10-CM | POA: Diagnosis not present

## 2017-12-20 DIAGNOSIS — M79646 Pain in unspecified finger(s): Secondary | ICD-10-CM | POA: Insufficient documentation

## 2017-12-20 DIAGNOSIS — M20012 Mallet finger of left finger(s): Secondary | ICD-10-CM | POA: Diagnosis not present

## 2017-12-20 DIAGNOSIS — I493 Ventricular premature depolarization: Secondary | ICD-10-CM | POA: Insufficient documentation

## 2017-12-20 DIAGNOSIS — M79645 Pain in left finger(s): Secondary | ICD-10-CM | POA: Diagnosis not present

## 2017-12-21 ENCOUNTER — Ambulatory Visit (HOSPITAL_COMMUNITY)
Admission: RE | Admit: 2017-12-21 | Discharge: 2017-12-21 | Disposition: A | Discharge status: Home / Self Care | Payer: Medicare Other | Source: Ambulatory Visit | Attending: Internal Medicine | Admitting: Internal Medicine

## 2017-12-21 ENCOUNTER — Encounter (HOSPITAL_COMMUNITY): Payer: Self-pay | Admitting: *Deleted

## 2017-12-21 ENCOUNTER — Encounter (HOSPITAL_COMMUNITY)
Admission: RE | Disposition: A | Discharge status: Home / Self Care | Payer: Self-pay | Source: Ambulatory Visit | Attending: Internal Medicine

## 2017-12-21 ENCOUNTER — Ambulatory Visit (HOSPITAL_COMMUNITY): Payer: Medicare Other | Admitting: Anesthesiology

## 2017-12-21 DIAGNOSIS — Z944 Liver transplant status: Secondary | ICD-10-CM | POA: Insufficient documentation

## 2017-12-21 DIAGNOSIS — N400 Enlarged prostate without lower urinary tract symptoms: Secondary | ICD-10-CM | POA: Insufficient documentation

## 2017-12-21 DIAGNOSIS — Z8249 Family history of ischemic heart disease and other diseases of the circulatory system: Secondary | ICD-10-CM | POA: Insufficient documentation

## 2017-12-21 DIAGNOSIS — Z1211 Encounter for screening for malignant neoplasm of colon: Secondary | ICD-10-CM | POA: Diagnosis not present

## 2017-12-21 DIAGNOSIS — K219 Gastro-esophageal reflux disease without esophagitis: Secondary | ICD-10-CM | POA: Insufficient documentation

## 2017-12-21 DIAGNOSIS — E119 Type 2 diabetes mellitus without complications: Secondary | ICD-10-CM | POA: Diagnosis not present

## 2017-12-21 DIAGNOSIS — I10 Essential (primary) hypertension: Secondary | ICD-10-CM | POA: Insufficient documentation

## 2017-12-21 DIAGNOSIS — Z794 Long term (current) use of insulin: Secondary | ICD-10-CM | POA: Insufficient documentation

## 2017-12-21 DIAGNOSIS — Z79899 Other long term (current) drug therapy: Secondary | ICD-10-CM | POA: Insufficient documentation

## 2017-12-21 DIAGNOSIS — K5730 Diverticulosis of large intestine without perforation or abscess without bleeding: Secondary | ICD-10-CM | POA: Diagnosis not present

## 2017-12-21 DIAGNOSIS — E039 Hypothyroidism, unspecified: Secondary | ICD-10-CM | POA: Diagnosis not present

## 2017-12-21 DIAGNOSIS — Z7982 Long term (current) use of aspirin: Secondary | ICD-10-CM | POA: Insufficient documentation

## 2017-12-21 DIAGNOSIS — Z87891 Personal history of nicotine dependence: Secondary | ICD-10-CM | POA: Insufficient documentation

## 2017-12-21 DIAGNOSIS — Z86718 Personal history of other venous thrombosis and embolism: Secondary | ICD-10-CM | POA: Insufficient documentation

## 2017-12-21 DIAGNOSIS — I2510 Atherosclerotic heart disease of native coronary artery without angina pectoris: Secondary | ICD-10-CM | POA: Diagnosis not present

## 2017-12-21 DIAGNOSIS — Z951 Presence of aortocoronary bypass graft: Secondary | ICD-10-CM | POA: Diagnosis not present

## 2017-12-21 DIAGNOSIS — E785 Hyperlipidemia, unspecified: Secondary | ICD-10-CM | POA: Insufficient documentation

## 2017-12-21 HISTORY — PX: COLONOSCOPY WITH PROPOFOL: SHX5780

## 2017-12-21 LAB — GLUCOSE, CAPILLARY
GLUCOSE-CAPILLARY: 105 mg/dL — AB (ref 70–99)
Glucose-Capillary: 58 mg/dL — ABNORMAL LOW (ref 70–99)
Glucose-Capillary: 98 mg/dL (ref 70–99)

## 2017-12-21 SURGERY — COLONOSCOPY WITH PROPOFOL
Anesthesia: Monitor Anesthesia Care

## 2017-12-21 MED ORDER — CHLORHEXIDINE GLUCONATE CLOTH 2 % EX PADS: 6.0000 | MEDICATED_PAD | Freq: Once | CUTANEOUS | Status: DC

## 2017-12-21 MED ORDER — PROPOFOL 10 MG/ML IV BOLUS
INTRAVENOUS | Status: DC | PRN
  Administered 2017-12-21: 20 mg via INTRAVENOUS
  Administered 2017-12-21: 60 mg via INTRAVENOUS

## 2017-12-21 MED ORDER — STERILE WATER FOR IRRIGATION IR SOLN
Status: DC | PRN
  Administered 2017-12-21: 100 mL

## 2017-12-21 MED ORDER — DEXTROSE 50 % IV SOLN
INTRAVENOUS | Status: AC
  Filled 2017-12-21: qty 50

## 2017-12-21 MED ORDER — LACTATED RINGERS IV SOLN
INTRAVENOUS | Status: DC
  Administered 2017-12-21: 1000 mL via INTRAVENOUS

## 2017-12-21 MED ORDER — DEXTROSE 50 % IV SOLN
25.0000 mL | Freq: Once | INTRAVENOUS | Status: AC
  Administered 2017-12-21: 25 mL via INTRAVENOUS

## 2017-12-21 MED ORDER — PROPOFOL 500 MG/50ML IV EMUL
INTRAVENOUS | Status: DC | PRN
  Administered 2017-12-21: 125 ug/kg/min via INTRAVENOUS

## 2017-12-21 NOTE — Interval H&P Note (Signed)
History and Physical Interval Note:  12/21/2017 7:19 AM  Pat [name]  has presented today for surgery, with the diagnosis of screening colonoscopy  The various methods of treatment have been discussed with the patient and family. After consideration of risks, benefits and other options for treatment, the patient has consented to  Procedure(s) with comments: COLONOSCOPY WITH PROPOFOL (N/A) - 7:30am as a surgical intervention .  The patient's history has been reviewed, patient examined, no change in status, stable for surgery.  I have reviewed the patient's chart and labs.  Questions were answered to the patient's satisfaction.     Charlena Haub  No change.  Ventricular ectopy being worked up by cardiologist.  No contraindication to today's procedure however.  Screening colonoscopy today per plan. The risks, benefits, limitations, alternatives and imponderables have been reviewed with the patient. Questions have been answered. All parties are agreeable.

## 2017-12-21 NOTE — Transfer of Care (Signed)
Immediate Anesthesia Transfer of Care Note  Patient: [name]  Procedure(s) Performed: COLONOSCOPY WITH PROPOFOL (N/A )  Patient Location: PACU  Anesthesia Type:MAC  Level of Consciousness: awake and patient cooperative  Airway & Oxygen Therapy: Patient Spontanous Breathing  Post-op Assessment: Report given to RN and Post -op Vital signs reviewed and stable  Post vital signs: Reviewed and stable  Last Vitals:  Vitals Value Taken Time  BP    Temp    Pulse    Resp    SpO2      Last Pain:  Vitals:   12/21/17 0754  TempSrc:   PainSc: 0-No pain      Patients Stated Pain Goal: 7 (70/96/28 3662)  Complications: No apparent anesthesia complications

## 2017-12-21 NOTE — Op Note (Signed)
[facility] Patient Name: [name] Procedure Date: 12/21/2017 7:22 AM MRN: [id number] Date of Birth: June 24, 1945 Attending MD: Norvel Richards , MD CSN: [id number] Age: 72 Admit Type: Outpatient Procedure:                Colonoscopy Indications:              Screening for colorectal malignant neoplasm Providers:                Norvel Richards, MD, Janeece Riggers, RN, Rosina Lowenstein, RN Referring MD:              Medicines:                Propofol per Anesthesia Complications:            No immediate complications. Estimated Blood Loss:     Estimated blood loss: none. Procedure:                Pre-Anesthesia Assessment:                           - Prior to the procedure, a History and Physical                            was performed, and patient medications and                            allergies were reviewed. The patient's tolerance of                            previous anesthesia was also reviewed. The risks                            and benefits of the procedure and the sedation                            options and risks were discussed with the patient.                            All questions were answered, and informed consent                            was obtained. Prior Anticoagulants: The patient has                            taken no previous anticoagulant or antiplatelet                            agents. ASA Grade Assessment: II - A patient with                            mild systemic disease. After reviewing the risks  and benefits, the patient was deemed in                            satisfactory condition to undergo the procedure.                           After obtaining informed consent, the colonoscope                            was passed under direct vision. Throughout the                            procedure, the patient's blood pressure, pulse, and                            oxygen  saturations were monitored continuously. The                            [id number]-[id number] ([id number]) scope was introduced through                            the anus and advanced to the the cecum, identified                            by appendiceal orifice and ileocecal valve. The                            colonoscopy was performed without difficulty. The                            patient tolerated the procedure well. The quality                            of the bowel preparation was adequate. Scope In: 7:39:22 AM Scope Out: 7:52:23 AM Scope Withdrawal Time: 0 hours 8 minutes 27 seconds  Total Procedure Duration: 0 hours 13 minutes 1 second  Findings:      The perianal and digital rectal examinations were normal.      A few small-mouthed diverticula were found in the sigmoid colon.      The exam was otherwise without abnormality on direct and retroflexion       views. Impression:               - Diverticulosis in the sigmoid colon.                           - The examination was otherwise normal on direct                            and retroflexion views.                           - No specimens collected. Moderate Sedation:      Moderate (conscious) sedation was personally administered by an       anesthesia professional. The following parameters were monitored: oxygen  saturation, heart rate, blood pressure, respiratory rate, EKG, adequacy       of pulmonary ventilation, and response to care. Total physician       intraservice time was 19 minutes. Recommendation:           - Patient has a contact number available for                            emergencies. The signs and symptoms of potential                            delayed complications were discussed with the                            patient. Return to normal activities tomorrow.                            Written discharge instructions were provided to the                            patient.                           - Resume  previous diet.                           - Continue present medications. No future                            colonoscopy unless new symptoms develop.                           - . Procedure Code(s):        --- Professional ---                           [phone], Colonoscopy, flexible; diagnostic, including                            collection of specimen(s) by brushing or washing,                            when performed (separate procedure) Diagnosis Code(s):        --- Professional ---                           Z12.11, Encounter for screening for malignant                            neoplasm of colon                           K57.30, Diverticulosis of large intestine without                            perforation or abscess without bleeding CPT copyright 2018 American [employer]. All rights reserved. The codes documented in this report  are preliminary and upon coder review may  be revised to meet current compliance requirements. Cristopher Estimable. Lalaine Overstreet, MD Norvel Richards, MD 12/21/2017 8:05:02 AM This report has been signed electronically. Number of Addenda: 0

## 2017-12-21 NOTE — Anesthesia Procedure Notes (Signed)
Procedure Name: MAC Date/Time: 12/21/2017 7:31 AM Performed by: Vista Deck, CRNA Pre-anesthesia Checklist: Patient identified, Emergency Drugs available, Suction available, Timeout performed and Patient being monitored Patient Re-evaluated:Patient Re-evaluated prior to induction Oxygen Delivery Method: Nasal Cannula

## 2017-12-21 NOTE — Anesthesia Preprocedure Evaluation (Signed)
Anesthesia Evaluation  Patient identified by MRN, date of birth, ID band Patient awake    Reviewed: Allergy & Precautions, H&P , NPO status , Patient's Chart, lab work & pertinent test results, reviewed documented beta blocker date and time   Airway Mallampati: II  TM Distance: >3 FB Neck ROM: full    Dental no notable dental hx. (+) Edentulous Upper, Partial Lower, Missing, Poor Dentition   Pulmonary neg pulmonary ROS, former smoker,    Pulmonary exam normal breath sounds clear to auscultation       Cardiovascular Exercise Tolerance: Good hypertension, + CAD   Rhythm:regular Rate:Normal     Neuro/Psych negative neurological ROS  negative psych ROS   GI/Hepatic Neg liver ROS, GERD  ,  Endo/Other  diabetesHypothyroidism   Renal/GU negative Renal ROS  negative genitourinary   Musculoskeletal   Abdominal   Peds  Hematology  (+) Blood dyscrasia, anemia ,   Anesthesia Other Findings PT HAS HAD A LIVER TRANSPLANT-NASH  Reproductive/Obstetrics negative OB ROS                             Anesthesia Physical Anesthesia Plan  ASA: III  Anesthesia Plan: MAC   Post-op Pain Management:    Induction:   PONV Risk Score and Plan:   Airway Management Planned:   Additional Equipment:   Intra-op Plan:   Post-operative Plan:   Informed Consent: I have reviewed the patients History and Physical, chart, labs and discussed the procedure including the risks, benefits and alternatives for the proposed anesthesia with the patient or authorized representative who has indicated his/her understanding and acceptance.   Dental Advisory Given  Plan Discussed with: CRNA  Anesthesia Plan Comments:         Anesthesia Quick Evaluation

## 2017-12-21 NOTE — Discharge Instructions (Addendum)
Colonoscopy Discharge Instructions  Read the instructions outlined below and refer to this sheet in the next few weeks. These discharge instructions provide you with general information on caring for yourself after you leave the hospital. Your doctor may also give you specific instructions. While your treatment has been planned according to the most current medical practices available, unavoidable complications occasionally occur. If you have any problems or questions after discharge, call Dr. Gala Romney at [phone]. ACTIVITY  You may resume your regular activity, but move at a slower pace for the next 24 hours.   Take frequent rest periods for the next 24 hours.   Walking will help get rid of the air and reduce the bloated feeling in your belly (abdomen).   No driving for 24 hours (because of the medicine (anesthesia) used during the test).    Do not sign any important legal documents or operate any machinery for 24 hours (because of the anesthesia used during the test).  NUTRITION  Drink plenty of fluids.   You may resume your normal diet as instructed by your doctor.   Begin with a light meal and progress to your normal diet. Heavy or fried foods are harder to digest and may make you feel sick to your stomach (nauseated).   Avoid alcoholic beverages for 24 hours or as instructed.  MEDICATIONS  You may resume your normal medications unless your doctor tells you otherwise.  WHAT YOU CAN EXPECT TODAY  Some feelings of bloating in the abdomen.   Passage of more gas than usual.   Spotting of blood in your stool or on the toilet paper.  IF YOU HAD POLYPS REMOVED DURING THE COLONOSCOPY:  No aspirin products for 7 days or as instructed.   No alcohol for 7 days or as instructed.   Eat a soft diet for the next 24 hours.  FINDING OUT THE RESULTS OF YOUR TEST Not all test results are available during your visit. If your test results are not back during the visit, make an appointment  with your caregiver to find out the results. Do not assume everything is normal if you have not heard from your caregiver or the medical facility. It is important for you to follow up on all of your test results.  SEEK IMMEDIATE MEDICAL ATTENTION IF:  You have more than a spotting of blood in your stool.   Your belly is swollen (abdominal distention).   You are nauseated or vomiting.   You have a temperature over 101.   You have abdominal pain or discomfort that is severe or gets worse throughout the day.   Colon diverticulosis information provided  I do not recommend a future colonoscopy unless new symptoms develop  Office visit with [place] in 6 months   Diverticulosis Diverticulosis is a condition that develops when small pouches (diverticula) form in the wall of the large intestine (colon). The colon is where water is absorbed and stool is formed. The pouches form when the inside layer of the colon pushes through weak spots in the outer layers of the colon. You may have a few pouches or many of them. What are the causes? The cause of this condition is not known. What increases the risk? The following factors may make you more likely to develop this condition:  Being older than age 57. Your risk for this condition increases with age. Diverticulosis is rare among people younger than age 71. By age 29, many people have it.  Eating a low-fiber diet.  Having frequent constipation.  Being overweight.  Not getting enough exercise.  Smoking.  Taking over-the-counter pain medicines, like aspirin and ibuprofen.  Having a family history of diverticulosis.  What are the signs or symptoms? In most people, there are no symptoms of this condition. If you do have symptoms, they may include:  Bloating.  Cramps in the abdomen.  Constipation or diarrhea.  Pain in the lower left side of the abdomen.  How is this diagnosed? This condition is most often diagnosed during an exam for  other colon problems. Because diverticulosis usually has no symptoms, it often cannot be diagnosed independently. This condition may be diagnosed by:  Using a flexible scope to examine the colon (colonoscopy).  Taking an X-ray of the colon after dye has been put into the colon (barium enema).  Doing a CT scan.  How is this treated? You may not need treatment for this condition if you have never developed an infection related to diverticulosis. If you have had an infection before, treatment may include:  Eating a high-fiber diet. This may include eating more fruits, vegetables, and grains.  Taking a fiber supplement.  Taking a live bacteria supplement (probiotic).  Taking medicine to relax your colon.  Taking antibiotic medicines.  Follow these instructions at home:  Drink 6-8 glasses of water or more each day to prevent constipation.  Try not to strain when you have a bowel movement.  If you have had an infection before: ? Eat more fiber as directed by your health care provider or your diet and nutrition specialist (dietitian). ? Take a fiber supplement or probiotic, if your health care provider approves.  Take over-the-counter and prescription medicines only as told by your health care provider.  If you were prescribed an antibiotic, take it as told by your health care provider. Do not stop taking the antibiotic even if you start to feel better.  Keep all follow-up visits as told by your health care provider. This is important. Contact a health care provider if:  You have pain in your abdomen.  You have bloating.  You have cramps.  You have not had a bowel movement in 3 days. Get help right away if:  Your pain gets worse.  Your bloating becomes very bad.  You have a fever or chills, and your symptoms suddenly get worse.  You vomit.  You have bowel movements that are bloody or black.  You have bleeding from your rectum. Summary  Diverticulosis is a  condition that develops when small pouches (diverticula) form in the wall of the large intestine (colon).  You may have a few pouches or many of them.  This condition is most often diagnosed during an exam for other colon problems.  If you have had an infection related to diverticulosis, treatment may include increasing the fiber in your diet, taking supplements, or taking medicines. This information is not intended to replace advice given to you by your health care provider. Make sure you discuss any questions you have with your health care provider. Document Released: 11/19/2003 Document Revised: 01/11/2016 Document Reviewed: 01/11/2016 Elsevier Interactive Patient Education  2017 [employer].

## 2017-12-21 NOTE — Anesthesia Postprocedure Evaluation (Signed)
Anesthesia Post Note  Patient: [name]  Procedure(s) Performed: COLONOSCOPY WITH PROPOFOL (N/A )  Patient location during evaluation: PACU Anesthesia Type: MAC Level of consciousness: awake and alert and patient cooperative Pain management: satisfactory to patient Vital Signs Assessment: post-procedure vital signs reviewed and stable Respiratory status: spontaneous breathing Cardiovascular status: stable Postop Assessment: no apparent nausea or vomiting Anesthetic complications: no     Last Vitals:  Vitals:   12/21/17 0815 12/21/17 0823  BP: (!) 148/89 (!) 146/82  Pulse: 64 (!) 59  Resp: 12   Temp:  36.4 C  SpO2: 100% 100%    Last Pain:  Vitals:   12/21/17 0823  TempSrc: Oral  PainSc: 0-No pain                 Kaelob Persky

## 2017-12-25 DIAGNOSIS — E785 Hyperlipidemia, unspecified: Secondary | ICD-10-CM | POA: Diagnosis not present

## 2017-12-25 DIAGNOSIS — D509 Iron deficiency anemia, unspecified: Secondary | ICD-10-CM | POA: Diagnosis not present

## 2017-12-25 DIAGNOSIS — E1122 Type 2 diabetes mellitus with diabetic chronic kidney disease: Secondary | ICD-10-CM | POA: Diagnosis not present

## 2017-12-25 DIAGNOSIS — I10 Essential (primary) hypertension: Secondary | ICD-10-CM | POA: Diagnosis not present

## 2017-12-25 DIAGNOSIS — E039 Hypothyroidism, unspecified: Secondary | ICD-10-CM | POA: Diagnosis not present

## 2017-12-27 ENCOUNTER — Encounter (HOSPITAL_COMMUNITY): Payer: Self-pay | Admitting: Internal Medicine

## 2018-01-01 DIAGNOSIS — K7581 Nonalcoholic steatohepatitis (NASH): Secondary | ICD-10-CM | POA: Diagnosis not present

## 2018-01-01 DIAGNOSIS — N183 Chronic kidney disease, stage 3 (moderate): Secondary | ICD-10-CM | POA: Diagnosis not present

## 2018-01-01 DIAGNOSIS — Z944 Liver transplant status: Secondary | ICD-10-CM | POA: Diagnosis not present

## 2018-01-01 DIAGNOSIS — E1122 Type 2 diabetes mellitus with diabetic chronic kidney disease: Secondary | ICD-10-CM | POA: Diagnosis not present

## 2018-01-01 DIAGNOSIS — Z0000 Encounter for general adult medical examination without abnormal findings: Secondary | ICD-10-CM | POA: Diagnosis not present

## 2018-01-04 DIAGNOSIS — Z23 Encounter for immunization: Secondary | ICD-10-CM | POA: Diagnosis not present

## 2018-01-22 DIAGNOSIS — Z4823 Encounter for aftercare following liver transplant: Secondary | ICD-10-CM | POA: Diagnosis not present

## 2018-01-22 DIAGNOSIS — Z944 Liver transplant status: Secondary | ICD-10-CM | POA: Diagnosis not present

## 2018-01-22 DIAGNOSIS — I517 Cardiomegaly: Secondary | ICD-10-CM | POA: Diagnosis not present

## 2018-01-22 DIAGNOSIS — D899 Disorder involving the immune mechanism, unspecified: Secondary | ICD-10-CM | POA: Diagnosis not present

## 2018-01-22 DIAGNOSIS — R9431 Abnormal electrocardiogram [ECG] [EKG]: Secondary | ICD-10-CM | POA: Diagnosis not present

## 2018-01-22 DIAGNOSIS — I2510 Atherosclerotic heart disease of native coronary artery without angina pectoris: Secondary | ICD-10-CM | POA: Diagnosis not present

## 2018-01-22 DIAGNOSIS — Z87891 Personal history of nicotine dependence: Secondary | ICD-10-CM | POA: Diagnosis not present

## 2018-01-22 DIAGNOSIS — I493 Ventricular premature depolarization: Secondary | ICD-10-CM | POA: Diagnosis not present

## 2018-02-12 DIAGNOSIS — I493 Ventricular premature depolarization: Secondary | ICD-10-CM | POA: Diagnosis not present

## 2018-02-27 DIAGNOSIS — I2510 Atherosclerotic heart disease of native coronary artery without angina pectoris: Secondary | ICD-10-CM | POA: Diagnosis not present

## 2018-02-27 DIAGNOSIS — I493 Ventricular premature depolarization: Secondary | ICD-10-CM | POA: Diagnosis not present

## 2018-03-29 DIAGNOSIS — D225 Melanocytic nevi of trunk: Secondary | ICD-10-CM | POA: Diagnosis not present

## 2018-03-29 DIAGNOSIS — X32XXXD Exposure to sunlight, subsequent encounter: Secondary | ICD-10-CM | POA: Diagnosis not present

## 2018-03-29 DIAGNOSIS — L821 Other seborrheic keratosis: Secondary | ICD-10-CM | POA: Diagnosis not present

## 2018-03-29 DIAGNOSIS — C44329 Squamous cell carcinoma of skin of other parts of face: Secondary | ICD-10-CM | POA: Diagnosis not present

## 2018-03-29 DIAGNOSIS — L570 Actinic keratosis: Secondary | ICD-10-CM | POA: Diagnosis not present

## 2018-04-11 DIAGNOSIS — I493 Ventricular premature depolarization: Secondary | ICD-10-CM | POA: Diagnosis not present

## 2018-04-23 ENCOUNTER — Emergency Department (HOSPITAL_COMMUNITY)
Admission: EM | Admit: 2018-04-23 | Discharge: 2018-04-23 | Disposition: A | Discharge status: Home / Self Care | Payer: Worker's Compensation | Attending: Emergency Medicine | Admitting: Emergency Medicine

## 2018-04-23 ENCOUNTER — Other Ambulatory Visit: Payer: Self-pay

## 2018-04-23 ENCOUNTER — Encounter (HOSPITAL_COMMUNITY): Payer: Self-pay | Admitting: Emergency Medicine

## 2018-04-23 ENCOUNTER — Emergency Department (HOSPITAL_COMMUNITY): Payer: Worker's Compensation

## 2018-04-23 DIAGNOSIS — I10 Essential (primary) hypertension: Secondary | ICD-10-CM | POA: Diagnosis not present

## 2018-04-23 DIAGNOSIS — Z87891 Personal history of nicotine dependence: Secondary | ICD-10-CM | POA: Diagnosis not present

## 2018-04-23 DIAGNOSIS — Z79899 Other long term (current) drug therapy: Secondary | ICD-10-CM | POA: Insufficient documentation

## 2018-04-23 DIAGNOSIS — E119 Type 2 diabetes mellitus without complications: Secondary | ICD-10-CM | POA: Diagnosis not present

## 2018-04-23 DIAGNOSIS — Z7982 Long term (current) use of aspirin: Secondary | ICD-10-CM | POA: Insufficient documentation

## 2018-04-23 DIAGNOSIS — S61412A Laceration without foreign body of left hand, initial encounter: Secondary | ICD-10-CM

## 2018-04-23 DIAGNOSIS — Y9289 Other specified places as the place of occurrence of the external cause: Secondary | ICD-10-CM | POA: Diagnosis not present

## 2018-04-23 DIAGNOSIS — Z794 Long term (current) use of insulin: Secondary | ICD-10-CM | POA: Diagnosis not present

## 2018-04-23 DIAGNOSIS — Z23 Encounter for immunization: Secondary | ICD-10-CM | POA: Insufficient documentation

## 2018-04-23 DIAGNOSIS — E039 Hypothyroidism, unspecified: Secondary | ICD-10-CM | POA: Diagnosis not present

## 2018-04-23 DIAGNOSIS — Y999 Unspecified external cause status: Secondary | ICD-10-CM | POA: Diagnosis not present

## 2018-04-23 DIAGNOSIS — Z944 Liver transplant status: Secondary | ICD-10-CM | POA: Insufficient documentation

## 2018-04-23 DIAGNOSIS — W228XXA Striking against or struck by other objects, initial encounter: Secondary | ICD-10-CM | POA: Diagnosis not present

## 2018-04-23 DIAGNOSIS — Y9389 Activity, other specified: Secondary | ICD-10-CM | POA: Insufficient documentation

## 2018-04-23 MED ORDER — LIDOCAINE HCL (PF) 1 % IJ SOLN
INTRAMUSCULAR | Status: AC
  Administered 2018-04-23: 5 mL
  Filled 2018-04-23: qty 5

## 2018-04-23 MED ORDER — POVIDONE-IODINE 10 % EX SOLN
CUTANEOUS | Status: AC
  Administered 2018-04-23: 13:00:00
  Filled 2018-04-23: qty 15

## 2018-04-23 MED ORDER — TETANUS-DIPHTH-ACELL PERTUSSIS 5-2.5-18.5 LF-MCG/0.5 IM SUSP
0.5000 mL | Freq: Once | INTRAMUSCULAR | Status: AC
  Administered 2018-04-23: 0.5 mL via INTRAMUSCULAR
  Filled 2018-04-23: qty 0.5

## 2018-04-23 MED ORDER — CEPHALEXIN 500 MG PO CAPS: 500.0000 mg | ORAL_CAPSULE | Freq: Two times a day (BID) | ORAL | 0 refills | Status: AC

## 2018-04-23 NOTE — ED Provider Notes (Signed)
[facility] Provider Note   CSN: [id number] Arrival date & time: 04/23/18  1032     History   Chief Complaint Chief Complaint  Patient presents with  . Laceration    left hand    HPI [name] is a 73 y.o. male.  73 year old male presents with left hand laceration.  Patient states that he was breaking up a mattress left at a dumpster when he accidentally cut his left hand.  Patient is laceration over the left third MCP.  Bleeding is controlled, he is not on blood thinners however is immune compromise due to liver transplant.  Patient's last tetanus was less than 5 years ago.  Patient is right-hand dominant, no other injuries, complaints, concerns.     Past Medical History:  Diagnosis Date  . Anemia    thrombocytopenia  . Atherosclerosis   . BPH (benign prostatic hyperplasia)   . Cardiomegaly   . Cirrhosis of liver ([place])    PT HAS HAD A LIVER TRANSPLANT-NASH, vaccinated for HepA/B on 05/31/11 afp 1.6, thrombus in splenic vein per ct  . Coronary artery disease 2004   s/p CAGB  . DM (diabetes mellitus) ([place])   . Erectile dysfunction   . Esophageal varices ([facility])     EGD 05/13/10, three columns grade II to III EV s/p banding, second banding since 01/2010.>Portal gastropathy.. >5 banding sessions in the past.  . GERD (gastroesophageal reflux disease)   . Gynecomastia, male    RIGHT  . History of transfusion of whole blood   . Hyperlipidemia   . Hypertension   . Hypothyroidism   . Overactive bladder   . Spleen enlarged   . Thrombocytopenia ([place])   . Thrombus    Chronic splenic, portal and smv    Patient Active Problem List   Diagnosis Date Noted  . Gynecomastia 09/18/2012  . Edema 08/08/2011  . Vomiting 05/31/2011  . Diarrhea 05/31/2011  . Abdominal distention 05/31/2011  . DIABETES MELLITUS, CONTROLLED, WITHOUT COMPLICATIONS 12/45/8099  . OTITIS EXTERNA, ACUTE, LEFT 11/07/2008  . SEBORRHEIC KERATOSIS 05/08/2008  . TRIGGER FINGER,  RIGHT MIDDLE 05/11/2007  . HYPERBILIRUBINEMIA 01/08/2007  . ABDOMINAL PAIN, UPPER 01/08/2007  . ACTINIC KERATOSIS 12/04/2006  . ERECTILE DYSFUNCTION 04/24/2006  . HYPOTHYROIDISM 02/01/2006  . HYPERLIPIDEMIA 02/01/2006  . ANEMIA-NOS 02/01/2006  . CORONARY ARTERY DISEASE 02/01/2006  . ESOPHAGEAL VARICES 02/01/2006  . HEPATIC FAILURE, END STAGE 02/01/2006  . CIRRHOSIS 02/01/2006  . SPLENOMEGALY 02/01/2006    Past Surgical History:  Procedure Laterality Date  . CARDIAC CATHETERIZATION  10/09/06  . CATARACT EXTRACTION W/PHACO Left 04/20/2015   Procedure: CATARACT EXTRACTION PHACO AND INTRAOCULAR LENS PLACEMENT LEFT EYE CDE=14.92;  Surgeon: Tonny Branch, MD;  Location: [facility];  Service: Ophthalmology;  Laterality: Left;  . COLONOSCOPY  01/2010   portal colopathy, sigmoid AVM (nonbleeding)  . COLONOSCOPY WITH PROPOFOL N/A 12/21/2017   Procedure: COLONOSCOPY WITH PROPOFOL;  Surgeon: Daneil Dolin, MD;  Location: [facility];  Service: Endoscopy;  Laterality: N/A;  7:30am  . CORONARY ARTERY BYPASS GRAFT  2004   3 vessels  . ESOPHAGOGASTRODUODENOSCOPY  01/2010   4 COLUMNS 2-3 GRADE ESOPHAGEAL VARICES,S/P BANDING,PORTAL GASTROPATHY  . ESOPHAGOGASTRODUODENOSCOPY  June 2012   Dr. Rourk-->3 columns of grade 1 esophageal varices with overlying scar, no banding treatment required, portal gastropathy. Next EGD in December 2013  . ESOPHAGOGASTRODUODENOSCOPY N/A 04/16/2012   Dr. Gala Romney: 3 columns Grade 2-3 esophageal varices, portal gastropathy, gastric antral vascular ectasia  . LIVER TRANSPLANT  01/2013  . ROTATOR CUFF REPAIR Left         Home Medications    Prior to Admission medications   Medication Sig Start Date End Date Taking? Authorizing Provider  aspirin 81 MG tablet Take 81 mg by mouth daily.    [provider]  atorvastatin (LIPITOR) 20 MG tablet Take 20 mg by mouth at bedtime.     [provider]  Calcium Carbonate-Vitamin D (CALCIUM 600+D PO) Take 1 tablet by  mouth daily.    [provider]  carvedilol (COREG) 6.25 MG tablet Take 6.25 mg by mouth at bedtime.     [provider]  cephALEXin (KEFLEX) 500 MG capsule Take 1 capsule (500 mg total) by mouth 2 (two) times daily for 5 days. 04/23/18 04/28/18  Tacy Learn, PA-C  cycloSPORINE modified (GENGRAF) 100 MG capsule Take 200-300 mg by mouth See admin instructions. Take 300mg  by mouth in the morning and 200 mg in the evening    [provider]  hydrochlorothiazide (HYDRODIURIL) 25 MG tablet Take 25 mg by mouth at bedtime.     [provider]  insulin glargine (LANTUS) 100 UNIT/ML injection Inject 14 Units into the skin daily.     [provider]  levothyroxine (SYNTHROID, LEVOTHROID) 125 MCG tablet Take 125 mcg by mouth daily before breakfast.     [provider]  Magnesium Oxide (MAGOX 400 PO) Take 1,200 mg by mouth daily.     [provider]  mycophenolate (CELLCEPT) 250 MG capsule Take 500 mg by mouth 2 (two) times daily.     [provider]    Family History Family History  Problem Relation Age of Onset  . Stroke Father 51  . Heart disease Mother 90       CHF  . Heart attack Brother   . Liver disease Neg Hx   . Colon cancer Neg Hx     Social History Social History   Tobacco Use  . Smoking status: Former Smoker    Packs/day: 2.00    Years: 20.00    Pack years: 40.00    Types: Cigarettes    Last attempt to quit: 04/14/1974    Years since quitting: 44.0  . Smokeless tobacco: Never Used  . Tobacco comment: Quit smoking at age 71  Substance Use Topics  . Alcohol use: No  . Drug use: No     Allergies   Patient has no known allergies.   Review of Systems Review of Systems  Musculoskeletal: Positive for arthralgias and myalgias.  Skin: Positive for wound.  Allergic/Immunologic: Positive for immunocompromised state.  Neurological: Negative for weakness and numbness.  Hematological: Does not bruise/bleed  easily.  Psychiatric/Behavioral: Negative for confusion and self-injury.     Physical Exam Updated Vital Signs BP 119/88 (BP Location: Right Arm)   Pulse 60   Temp (!) 97.5 F (36.4 C) (Oral)   Resp 14   Ht 5\' 7"  (1.702 m)   Wt 76.7 kg   SpO2 100%   BMI 26.47 kg/m   Physical Exam Vitals signs and nursing note reviewed.  Constitutional:      General: He is not in acute distress.    Appearance: He is well-developed. He is not diaphoretic.  HENT:     Head: Normocephalic and atraumatic.  Cardiovascular:     Pulses: Normal pulses.  Pulmonary:     Effort: Pulmonary effort is normal.  Musculoskeletal:        General: Swelling, tenderness  and signs of injury present.       Hands:  Skin:    General: Skin is warm and dry.     Findings: No erythema or rash.  Neurological:     General: No focal deficit present.     Mental Status: He is alert and oriented to person, place, and time.  Psychiatric:        Behavior: Behavior normal.      ED Treatments / Results  Labs (all labs ordered are listed, but only abnormal results are displayed) Labs Reviewed - No data to display  EKG None  Radiology Dg Hand Complete Left  Result Date: 04/23/2018 CLINICAL DATA:  Left hand laceration after injury today. EXAM: LEFT HAND - COMPLETE 3+ VIEW COMPARISON:  None. FINDINGS: There is no evidence of fracture or dislocation. There is no evidence of arthropathy or other focal bone abnormality. Vascular calcifications are noted. IMPRESSION: No acute abnormality seen in the left hand. Electronically Signed   By: Marijo Conception, M.D.   On: 04/23/2018 11:53    Procedures .Marland KitchenLaceration Repair Date/Time: 04/23/2018 12:48 PM Performed by: Tacy Learn, PA-C Authorized by: Tacy Learn, PA-C   Consent:    Consent obtained:  Verbal   Consent given by:  Patient   Risks discussed:  Infection, need for additional repair, pain, poor cosmetic result and poor wound healing   Alternatives  discussed:  No treatment and delayed treatment Universal protocol:    Procedure explained and questions answered to patient or proxy's satisfaction: yes     Relevant documents present and verified: yes     Test results available and properly labeled: yes     Imaging studies available: yes     Required blood products, implants, devices, and special equipment available: yes     Site/side marked: yes     Immediately prior to procedure, a time out was called: yes     Patient identity confirmed:  Verbally with patient Anesthesia (see MAR for exact dosages):    Anesthesia method:  Local infiltration   Local anesthetic:  Lidocaine 1% w/o epi Laceration details:    Location:  Hand   Hand location:  L hand, dorsum   Length (cm):  2   Depth (mm):  3 Repair type:    Repair type:  Simple Pre-procedure details:    Preparation:  Patient was prepped and draped in usual sterile fashion and imaging obtained to evaluate for foreign bodies Exploration:    Hemostasis achieved with:  Direct pressure   Wound exploration: wound explored through full range of motion and entire depth of wound probed and visualized     Wound extent: no foreign bodies/material noted, no muscle damage noted, no tendon damage noted and no underlying fracture noted     Contaminated: no   Treatment:    Area cleansed with:  Betadine and saline   Amount of cleaning:  Standard   Irrigation solution:  Sterile saline Skin repair:    Repair method:  Sutures   Suture size:  4-0   Suture material:  Nylon   Suture technique:  Simple interrupted   Number of sutures:  3 Approximation:    Approximation:  Close Post-procedure details:    Dressing:  Bulky dressing   Patient tolerance of procedure:  Tolerated well, no immediate complications   (including critical care time)  Medications Ordered in ED Medications  lidocaine (PF) (XYLOCAINE) 1 % injection (5 mLs  Given 04/23/18 1243)  povidone-iodine (BETADINE) 10 %  external solution  (  Given 04/23/18 1243)     Initial Impression / Assessment and Plan / ED Course  I have reviewed the triage vital signs and the nursing notes.  Pertinent labs & imaging results that were available during my care of the patient were reviewed by me and considered in my medical decision making (see chart for details).    Final Clinical Impressions(s) / ED Diagnoses   Final diagnoses:  Laceration of left hand, foreign body presence unspecified, initial encounter    ED Discharge Orders         Ordered    cephALEXin (KEFLEX) 500 MG capsule  2 times daily     04/23/18 1247           Roque Lias 04/23/18 1251    Nat Christen, MD 04/24/18 [id number]

## 2018-04-23 NOTE — ED Triage Notes (Signed)
Injury to left hand with wood bed frame cut top of hand.  Bleeding is controlled.  Last tetanus unknown.  Not on any blood thinner.  Rates pain 3/10.

## 2018-04-23 NOTE — Discharge Instructions (Addendum)
Recommend wound check in 2 days. Take Keflex as prescribed and complete the full course.  It is still possible to develop infection, recheck for any concerning symptoms. Suture removal in 10 days.

## 2018-04-24 DIAGNOSIS — D899 Disorder involving the immune mechanism, unspecified: Secondary | ICD-10-CM | POA: Diagnosis not present

## 2018-04-24 DIAGNOSIS — Z48298 Encounter for aftercare following other organ transplant: Secondary | ICD-10-CM | POA: Diagnosis not present

## 2018-04-24 DIAGNOSIS — Z944 Liver transplant status: Secondary | ICD-10-CM | POA: Diagnosis not present

## 2018-04-26 DIAGNOSIS — L570 Actinic keratosis: Secondary | ICD-10-CM | POA: Diagnosis not present

## 2018-04-26 DIAGNOSIS — D509 Iron deficiency anemia, unspecified: Secondary | ICD-10-CM | POA: Diagnosis not present

## 2018-04-26 DIAGNOSIS — Z08 Encounter for follow-up examination after completed treatment for malignant neoplasm: Secondary | ICD-10-CM | POA: Diagnosis not present

## 2018-04-26 DIAGNOSIS — Z85828 Personal history of other malignant neoplasm of skin: Secondary | ICD-10-CM | POA: Diagnosis not present

## 2018-04-26 DIAGNOSIS — E785 Hyperlipidemia, unspecified: Secondary | ICD-10-CM | POA: Diagnosis not present

## 2018-04-26 DIAGNOSIS — E1122 Type 2 diabetes mellitus with diabetic chronic kidney disease: Secondary | ICD-10-CM | POA: Diagnosis not present

## 2018-04-26 DIAGNOSIS — X32XXXD Exposure to sunlight, subsequent encounter: Secondary | ICD-10-CM | POA: Diagnosis not present

## 2018-04-26 DIAGNOSIS — D649 Anemia, unspecified: Secondary | ICD-10-CM | POA: Diagnosis not present

## 2018-05-10 DIAGNOSIS — I2510 Atherosclerotic heart disease of native coronary artery without angina pectoris: Secondary | ICD-10-CM | POA: Diagnosis not present

## 2018-05-10 DIAGNOSIS — E039 Hypothyroidism, unspecified: Secondary | ICD-10-CM | POA: Diagnosis not present

## 2018-05-10 DIAGNOSIS — E1122 Type 2 diabetes mellitus with diabetic chronic kidney disease: Secondary | ICD-10-CM | POA: Diagnosis not present

## 2018-05-10 DIAGNOSIS — E875 Hyperkalemia: Secondary | ICD-10-CM | POA: Diagnosis not present

## 2018-05-10 DIAGNOSIS — E781 Pure hyperglyceridemia: Secondary | ICD-10-CM | POA: Diagnosis not present

## 2018-06-22 ENCOUNTER — Encounter: Payer: Self-pay | Admitting: Gastroenterology

## 2018-06-22 ENCOUNTER — Ambulatory Visit (INDEPENDENT_AMBULATORY_CARE_PROVIDER_SITE_OTHER): Payer: Medicare Other | Admitting: Gastroenterology

## 2018-06-22 ENCOUNTER — Other Ambulatory Visit: Payer: Self-pay

## 2018-06-22 DIAGNOSIS — Z09 Encounter for follow-up examination after completed treatment for conditions other than malignant neoplasm: Secondary | ICD-10-CM | POA: Diagnosis not present

## 2018-06-22 NOTE — Progress Notes (Signed)
Primary Care Physician:  Celene Squibb, MD  Primary GI: Dr. Gala Romney  Virtual Visit via Telephone Note Due to COVID-19, visit is conducted virtually and was requested by patient.   I connected with [name] on 06/22/18 at  9:00 AM EDT by telephone and verified that I am speaking with the correct person using two identifiers.   I discussed the limitations, risks, security and privacy concerns of performing an evaluation and management service by telephone and the availability of in person appointments. I also discussed with the patient that there may be a patient responsible charge related to this service. The patient expressed understanding and agreed to proceed.  Chief Complaint  Patient presents with  . Follow-up    pp. doing well     History of Present Illness: 73 year old male with history orthotopic liver transplant in 2014, recent screening colonoscopy on file with sigmoid diverticulosis otherwise normal, now for routine follow-up visit.   Denies any abdominal pain, N/V, constipation, diarrhea, changes in bowel habits, rectal bleeding. Follows closely with [facility]. No GERD symptoms. 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 Only new health concern is  right knee with torn meniscus. May have to have surgery.   Past Medical History:  Diagnosis Date  . Anemia    thrombocytopenia  . Atherosclerosis   . BPH (benign prostatic hyperplasia)   . Cardiomegaly   . Cirrhosis of liver ([place])    PT HAS HAD A LIVER TRANSPLANT-NASH, vaccinated for HepA/B on 05/31/11 afp 1.6, thrombus in splenic vein per ct  . Coronary artery disease 2004   s/p CAGB  . DM (diabetes mellitus) ([place])   . Erectile dysfunction   . Esophageal varices (HCC)     EGD 05/13/10, three columns grade II to III EV s/p banding, second banding since 01/2010.>Portal gastropathy.. >5 banding sessions in the past.  . GERD  (gastroesophageal reflux disease)   . Gynecomastia, male    RIGHT  . History of transfusion of whole blood   . Hyperlipidemia   . Hypertension   . Hypothyroidism   . Overactive bladder   . Spleen enlarged   . Thrombocytopenia ([place])   . Thrombus    Chronic splenic, portal and smv     Past Surgical History:  Procedure Laterality Date  . CARDIAC CATHETERIZATION  10/09/06  . CATARACT EXTRACTION W/PHACO Left 04/20/2015   Procedure: CATARACT EXTRACTION PHACO AND INTRAOCULAR LENS PLACEMENT LEFT EYE CDE=14.92;  Surgeon: Tonny Branch, MD;  Location: [facility];  Service: Ophthalmology;  Laterality: Left;  . COLONOSCOPY  01/2010   portal colopathy, sigmoid AVM (nonbleeding)  . COLONOSCOPY WITH PROPOFOL N/A 12/21/2017   Dr. Gala Romney: sigmoid diverticulosis, otherwise normal  . CORONARY ARTERY BYPASS GRAFT  2004   3 vessels  . ESOPHAGOGASTRODUODENOSCOPY  01/2010   4 COLUMNS 2-3 GRADE ESOPHAGEAL VARICES,S/P BANDING,PORTAL GASTROPATHY  . ESOPHAGOGASTRODUODENOSCOPY  June 2012   Dr. Rourk-->3 columns of grade 1 esophageal varices with overlying scar, no banding treatment required, portal gastropathy. Next EGD in December 2013  . ESOPHAGOGASTRODUODENOSCOPY N/A 04/16/2012   Dr. Gala Romney: 3 columns Grade 2-3 esophageal varices, portal gastropathy, gastric antral vascular ectasia  . LIVER TRANSPLANT  01/2013  . ROTATOR CUFF REPAIR Left      Current Meds  Medication Sig  . aspirin 81 MG tablet Take 81 mg by mouth daily.  Marland Kitchen atorvastatin (LIPITOR) 20 MG tablet Take 20  mg by mouth at bedtime.   . Calcium Carbonate-Vitamin D (CALCIUM 600+D PO) Take 1 tablet by mouth daily.  . carvedilol (COREG) 6.25 MG tablet Take 6.25 mg by mouth at bedtime.   . cycloSPORINE modified (GENGRAF) 100 MG capsule Take 200-300 mg by mouth See admin instructions. Take 300mg  by mouth in the morning and 200 mg in the evening  . hydrochlorothiazide (HYDRODIURIL) 25 MG tablet Take 25 mg by mouth at bedtime.   . insulin glargine (LANTUS)  100 UNIT/ML injection Inject 14 Units into the skin daily.   Marland Kitchen levothyroxine (SYNTHROID, LEVOTHROID) 125 MCG tablet Take 137 mcg by mouth daily before breakfast.   . Magnesium Oxide (MAGOX 400 PO) Take 1,200 mg by mouth daily.   . mycophenolate (CELLCEPT) 250 MG capsule Take 250 mg by mouth 2 (two) times daily.        Observations/Objective: No distress. Unable to perform physical exam due to telephone encounter. No video available.   Assessment and Plan: 73 year old male s/p liver transplant in 2014 followed by [facility], recent screening colonoscopy normal, doing well from a GI standpoint. No other concerns today. As he is doing well, return in 1 year or sooner if needed to see Dr. Gala Romney.   Follow Up Instructions:    I discussed the assessment and treatment plan with the patient. The patient was provided an opportunity to ask questions and all were answered. The patient agreed with the plan and demonstrated an understanding of the instructions.   The patient was advised to call back or seek an in-person evaluation if the symptoms worsen or if the condition fails to improve as anticipated.  I provided 10 minutes of non-face-to-face time during this encounter.  Annitta Needs, PhD, ANP-BC [facility]

## 2018-06-22 NOTE — Progress Notes (Signed)
CC'ED TO PCP 

## 2018-06-22 NOTE — Patient Instructions (Signed)
I am glad you are doing well!  Please call with any concerns  Otherwise, you will see Dr. Gala Romney in 1 year!  It was a pleasure to talk to you today. I strive to create trusting relationships with patients to provide genuine, compassionate, and quality care. I value your feedback. If you receive a survey regarding your visit,  I greatly appreciate you taking time to fill this out.   Annitta Needs, PhD, ANP-BC [facility]

## 2018-07-10 DIAGNOSIS — I2510 Atherosclerotic heart disease of native coronary artery without angina pectoris: Secondary | ICD-10-CM | POA: Diagnosis not present

## 2018-07-11 DIAGNOSIS — Z0000 Encounter for general adult medical examination without abnormal findings: Secondary | ICD-10-CM | POA: Diagnosis not present

## 2018-07-18 DIAGNOSIS — E119 Type 2 diabetes mellitus without complications: Secondary | ICD-10-CM | POA: Diagnosis not present

## 2018-07-18 DIAGNOSIS — R9431 Abnormal electrocardiogram [ECG] [EKG]: Secondary | ICD-10-CM | POA: Diagnosis not present

## 2018-07-18 DIAGNOSIS — K7581 Nonalcoholic steatohepatitis (NASH): Secondary | ICD-10-CM | POA: Diagnosis not present

## 2018-07-18 DIAGNOSIS — K7460 Unspecified cirrhosis of liver: Secondary | ICD-10-CM | POA: Diagnosis not present

## 2018-07-18 DIAGNOSIS — I2510 Atherosclerotic heart disease of native coronary artery without angina pectoris: Secondary | ICD-10-CM | POA: Diagnosis not present

## 2018-07-18 DIAGNOSIS — I10 Essential (primary) hypertension: Secondary | ICD-10-CM | POA: Diagnosis not present

## 2018-07-19 DIAGNOSIS — L570 Actinic keratosis: Secondary | ICD-10-CM | POA: Diagnosis not present

## 2018-07-19 DIAGNOSIS — X32XXXD Exposure to sunlight, subsequent encounter: Secondary | ICD-10-CM | POA: Diagnosis not present

## 2018-07-19 DIAGNOSIS — C44329 Squamous cell carcinoma of skin of other parts of face: Secondary | ICD-10-CM | POA: Diagnosis not present

## 2018-07-19 DIAGNOSIS — C44229 Squamous cell carcinoma of skin of left ear and external auricular canal: Secondary | ICD-10-CM | POA: Diagnosis not present

## 2018-08-16 DIAGNOSIS — Z85828 Personal history of other malignant neoplasm of skin: Secondary | ICD-10-CM | POA: Diagnosis not present

## 2018-08-16 DIAGNOSIS — L570 Actinic keratosis: Secondary | ICD-10-CM | POA: Diagnosis not present

## 2018-08-16 DIAGNOSIS — Z08 Encounter for follow-up examination after completed treatment for malignant neoplasm: Secondary | ICD-10-CM | POA: Diagnosis not present

## 2018-08-16 DIAGNOSIS — C44329 Squamous cell carcinoma of skin of other parts of face: Secondary | ICD-10-CM | POA: Diagnosis not present

## 2018-08-16 DIAGNOSIS — X32XXXD Exposure to sunlight, subsequent encounter: Secondary | ICD-10-CM | POA: Diagnosis not present

## 2018-08-20 DIAGNOSIS — Z944 Liver transplant status: Secondary | ICD-10-CM | POA: Diagnosis not present

## 2018-08-20 DIAGNOSIS — I493 Ventricular premature depolarization: Secondary | ICD-10-CM | POA: Diagnosis not present

## 2018-08-28 DIAGNOSIS — H1032 Unspecified acute conjunctivitis, left eye: Secondary | ICD-10-CM | POA: Diagnosis not present

## 2018-08-28 DIAGNOSIS — I493 Ventricular premature depolarization: Secondary | ICD-10-CM | POA: Diagnosis not present

## 2018-09-03 DIAGNOSIS — D899 Disorder involving the immune mechanism, unspecified: Secondary | ICD-10-CM | POA: Diagnosis not present

## 2018-09-03 DIAGNOSIS — Z944 Liver transplant status: Secondary | ICD-10-CM | POA: Diagnosis not present

## 2018-09-13 DIAGNOSIS — E1122 Type 2 diabetes mellitus with diabetic chronic kidney disease: Secondary | ICD-10-CM | POA: Diagnosis not present

## 2018-09-13 DIAGNOSIS — D509 Iron deficiency anemia, unspecified: Secondary | ICD-10-CM | POA: Diagnosis not present

## 2018-09-13 DIAGNOSIS — E785 Hyperlipidemia, unspecified: Secondary | ICD-10-CM | POA: Diagnosis not present

## 2018-09-13 DIAGNOSIS — D649 Anemia, unspecified: Secondary | ICD-10-CM | POA: Diagnosis not present

## 2018-09-13 DIAGNOSIS — N183 Chronic kidney disease, stage 3 (moderate): Secondary | ICD-10-CM | POA: Diagnosis not present

## 2018-09-20 DIAGNOSIS — N183 Chronic kidney disease, stage 3 (moderate): Secondary | ICD-10-CM | POA: Diagnosis not present

## 2018-09-20 DIAGNOSIS — R945 Abnormal results of liver function studies: Secondary | ICD-10-CM | POA: Diagnosis not present

## 2018-09-20 DIAGNOSIS — I2510 Atherosclerotic heart disease of native coronary artery without angina pectoris: Secondary | ICD-10-CM | POA: Diagnosis not present

## 2018-09-20 DIAGNOSIS — E1122 Type 2 diabetes mellitus with diabetic chronic kidney disease: Secondary | ICD-10-CM | POA: Diagnosis not present

## 2018-09-20 DIAGNOSIS — E782 Mixed hyperlipidemia: Secondary | ICD-10-CM | POA: Diagnosis not present

## 2018-09-27 DIAGNOSIS — Z944 Liver transplant status: Secondary | ICD-10-CM | POA: Diagnosis not present

## 2018-09-27 DIAGNOSIS — D899 Disorder involving the immune mechanism, unspecified: Secondary | ICD-10-CM | POA: Diagnosis not present

## 2018-10-25 DIAGNOSIS — Z08 Encounter for follow-up examination after completed treatment for malignant neoplasm: Secondary | ICD-10-CM | POA: Diagnosis not present

## 2018-10-25 DIAGNOSIS — L570 Actinic keratosis: Secondary | ICD-10-CM | POA: Diagnosis not present

## 2018-10-25 DIAGNOSIS — H01009 Unspecified blepharitis unspecified eye, unspecified eyelid: Secondary | ICD-10-CM | POA: Diagnosis not present

## 2018-10-25 DIAGNOSIS — X32XXXD Exposure to sunlight, subsequent encounter: Secondary | ICD-10-CM | POA: Diagnosis not present

## 2018-10-25 DIAGNOSIS — Z85828 Personal history of other malignant neoplasm of skin: Secondary | ICD-10-CM | POA: Diagnosis not present

## 2019-01-03 DIAGNOSIS — D899 Disorder involving the immune mechanism, unspecified: Secondary | ICD-10-CM | POA: Diagnosis not present

## 2019-01-03 DIAGNOSIS — Z944 Liver transplant status: Secondary | ICD-10-CM | POA: Diagnosis not present

## 2019-01-16 DIAGNOSIS — H60312 Diffuse otitis externa, left ear: Secondary | ICD-10-CM | POA: Diagnosis not present

## 2019-01-21 DIAGNOSIS — D509 Iron deficiency anemia, unspecified: Secondary | ICD-10-CM | POA: Diagnosis not present

## 2019-01-21 DIAGNOSIS — I10 Essential (primary) hypertension: Secondary | ICD-10-CM | POA: Diagnosis not present

## 2019-01-21 DIAGNOSIS — E785 Hyperlipidemia, unspecified: Secondary | ICD-10-CM | POA: Diagnosis not present

## 2019-01-21 DIAGNOSIS — E1122 Type 2 diabetes mellitus with diabetic chronic kidney disease: Secondary | ICD-10-CM | POA: Diagnosis not present

## 2019-01-21 DIAGNOSIS — D649 Anemia, unspecified: Secondary | ICD-10-CM | POA: Diagnosis not present

## 2019-01-25 DIAGNOSIS — Z944 Liver transplant status: Secondary | ICD-10-CM | POA: Diagnosis not present

## 2019-01-25 DIAGNOSIS — E1122 Type 2 diabetes mellitus with diabetic chronic kidney disease: Secondary | ICD-10-CM | POA: Diagnosis not present

## 2019-01-25 DIAGNOSIS — E039 Hypothyroidism, unspecified: Secondary | ICD-10-CM | POA: Diagnosis not present

## 2019-01-25 DIAGNOSIS — E782 Mixed hyperlipidemia: Secondary | ICD-10-CM | POA: Diagnosis not present

## 2019-01-25 DIAGNOSIS — I2510 Atherosclerotic heart disease of native coronary artery without angina pectoris: Secondary | ICD-10-CM | POA: Diagnosis not present

## 2019-02-08 DIAGNOSIS — I129 Hypertensive chronic kidney disease with stage 1 through stage 4 chronic kidney disease, or unspecified chronic kidney disease: Secondary | ICD-10-CM | POA: Diagnosis not present

## 2019-03-11 DIAGNOSIS — R7989 Other specified abnormal findings of blood chemistry: Secondary | ICD-10-CM | POA: Diagnosis not present

## 2019-03-11 DIAGNOSIS — Z87891 Personal history of nicotine dependence: Secondary | ICD-10-CM | POA: Diagnosis not present

## 2019-03-11 DIAGNOSIS — Z79899 Other long term (current) drug therapy: Secondary | ICD-10-CM | POA: Diagnosis not present

## 2019-03-11 DIAGNOSIS — Z944 Liver transplant status: Secondary | ICD-10-CM | POA: Diagnosis not present

## 2019-03-11 DIAGNOSIS — I129 Hypertensive chronic kidney disease with stage 1 through stage 4 chronic kidney disease, or unspecified chronic kidney disease: Secondary | ICD-10-CM | POA: Diagnosis not present

## 2019-03-11 DIAGNOSIS — N1832 Chronic kidney disease, stage 3b: Secondary | ICD-10-CM | POA: Diagnosis not present

## 2019-03-11 DIAGNOSIS — I10 Essential (primary) hypertension: Secondary | ICD-10-CM | POA: Diagnosis not present

## 2019-03-11 DIAGNOSIS — D84821 Immunodeficiency due to drugs: Secondary | ICD-10-CM | POA: Diagnosis not present

## 2019-03-11 DIAGNOSIS — E875 Hyperkalemia: Secondary | ICD-10-CM | POA: Diagnosis not present

## 2019-03-11 DIAGNOSIS — Z4823 Encounter for aftercare following liver transplant: Secondary | ICD-10-CM | POA: Diagnosis not present

## 2019-03-11 DIAGNOSIS — D849 Immunodeficiency, unspecified: Secondary | ICD-10-CM | POA: Diagnosis not present

## 2019-03-11 DIAGNOSIS — D8489 Other immunodeficiencies: Secondary | ICD-10-CM | POA: Diagnosis not present

## 2019-03-27 DIAGNOSIS — D899 Disorder involving the immune mechanism, unspecified: Secondary | ICD-10-CM | POA: Diagnosis not present

## 2019-03-27 DIAGNOSIS — Z944 Liver transplant status: Secondary | ICD-10-CM | POA: Diagnosis not present

## 2019-03-28 DIAGNOSIS — X32XXXD Exposure to sunlight, subsequent encounter: Secondary | ICD-10-CM | POA: Diagnosis not present

## 2019-03-28 DIAGNOSIS — L570 Actinic keratosis: Secondary | ICD-10-CM | POA: Diagnosis not present

## 2019-03-28 DIAGNOSIS — L820 Inflamed seborrheic keratosis: Secondary | ICD-10-CM | POA: Diagnosis not present

## 2019-05-08 DIAGNOSIS — H9202 Otalgia, left ear: Secondary | ICD-10-CM | POA: Diagnosis not present

## 2019-05-20 ENCOUNTER — Encounter: Payer: Self-pay | Admitting: Internal Medicine

## 2019-05-20 DIAGNOSIS — D899 Disorder involving the immune mechanism, unspecified: Secondary | ICD-10-CM | POA: Diagnosis not present

## 2019-05-20 DIAGNOSIS — Z944 Liver transplant status: Secondary | ICD-10-CM | POA: Diagnosis not present

## 2019-05-27 DIAGNOSIS — E039 Hypothyroidism, unspecified: Secondary | ICD-10-CM | POA: Diagnosis not present

## 2019-05-27 DIAGNOSIS — E1022 Type 1 diabetes mellitus with diabetic chronic kidney disease: Secondary | ICD-10-CM | POA: Diagnosis not present

## 2019-05-27 DIAGNOSIS — D696 Thrombocytopenia, unspecified: Secondary | ICD-10-CM | POA: Diagnosis not present

## 2019-05-27 DIAGNOSIS — R945 Abnormal results of liver function studies: Secondary | ICD-10-CM | POA: Diagnosis not present

## 2019-05-27 DIAGNOSIS — Z23 Encounter for immunization: Secondary | ICD-10-CM | POA: Diagnosis not present

## 2019-05-27 DIAGNOSIS — D509 Iron deficiency anemia, unspecified: Secondary | ICD-10-CM | POA: Diagnosis not present

## 2019-05-27 DIAGNOSIS — D649 Anemia, unspecified: Secondary | ICD-10-CM | POA: Diagnosis not present

## 2019-05-27 DIAGNOSIS — Z0000 Encounter for general adult medical examination without abnormal findings: Secondary | ICD-10-CM | POA: Diagnosis not present

## 2019-05-30 DIAGNOSIS — Z944 Liver transplant status: Secondary | ICD-10-CM | POA: Diagnosis not present

## 2019-05-30 DIAGNOSIS — E1122 Type 2 diabetes mellitus with diabetic chronic kidney disease: Secondary | ICD-10-CM | POA: Diagnosis not present

## 2019-05-30 DIAGNOSIS — E039 Hypothyroidism, unspecified: Secondary | ICD-10-CM | POA: Diagnosis not present

## 2019-05-30 DIAGNOSIS — I2510 Atherosclerotic heart disease of native coronary artery without angina pectoris: Secondary | ICD-10-CM | POA: Diagnosis not present

## 2019-05-30 DIAGNOSIS — E782 Mixed hyperlipidemia: Secondary | ICD-10-CM | POA: Diagnosis not present

## 2019-05-30 DIAGNOSIS — Z0001 Encounter for general adult medical examination with abnormal findings: Secondary | ICD-10-CM | POA: Diagnosis not present

## 2019-05-31 DIAGNOSIS — I361 Nonrheumatic tricuspid (valve) insufficiency: Secondary | ICD-10-CM | POA: Diagnosis not present

## 2019-06-03 DIAGNOSIS — L570 Actinic keratosis: Secondary | ICD-10-CM | POA: Diagnosis not present

## 2019-06-03 DIAGNOSIS — X32XXXD Exposure to sunlight, subsequent encounter: Secondary | ICD-10-CM | POA: Diagnosis not present

## 2019-06-03 DIAGNOSIS — C44321 Squamous cell carcinoma of skin of nose: Secondary | ICD-10-CM | POA: Diagnosis not present

## 2019-06-03 DIAGNOSIS — B078 Other viral warts: Secondary | ICD-10-CM | POA: Diagnosis not present

## 2019-06-03 DIAGNOSIS — L820 Inflamed seborrheic keratosis: Secondary | ICD-10-CM | POA: Diagnosis not present

## 2019-06-03 DIAGNOSIS — C44222 Squamous cell carcinoma of skin of right ear and external auricular canal: Secondary | ICD-10-CM | POA: Diagnosis not present

## 2019-06-11 DIAGNOSIS — D899 Disorder involving the immune mechanism, unspecified: Secondary | ICD-10-CM | POA: Diagnosis not present

## 2019-06-11 DIAGNOSIS — Z944 Liver transplant status: Secondary | ICD-10-CM | POA: Diagnosis not present

## 2019-06-26 ENCOUNTER — Ambulatory Visit
Admission: EM | Admit: 2019-06-26 | Discharge: 2019-06-26 | Disposition: A | Discharge status: Home / Self Care | Payer: Medicare Other | Attending: Emergency Medicine | Admitting: Emergency Medicine

## 2019-06-26 ENCOUNTER — Other Ambulatory Visit: Payer: Self-pay

## 2019-06-26 DIAGNOSIS — H01005 Unspecified blepharitis left lower eyelid: Secondary | ICD-10-CM | POA: Diagnosis not present

## 2019-06-26 DIAGNOSIS — H0289 Other specified disorders of eyelid: Secondary | ICD-10-CM

## 2019-06-26 DIAGNOSIS — D899 Disorder involving the immune mechanism, unspecified: Secondary | ICD-10-CM | POA: Diagnosis not present

## 2019-06-26 DIAGNOSIS — Z944 Liver transplant status: Secondary | ICD-10-CM | POA: Diagnosis not present

## 2019-06-26 MED ORDER — ERYTHROMYCIN 5 MG/GM OP OINT: TOPICAL_OINTMENT | OPHTHALMIC | 0 refills | Status: DC

## 2019-06-26 NOTE — ED Provider Notes (Signed)
[place]   [id number] 06/26/19 Arrival Time: [id number]  CC: LT eyelid irritation  SUBJECTIVE:  [name] is a 74 y.o. male who presents with complaint of LT eyelid irritation and discomfort x several days.  Denies a precipitating event, trauma, or close contacts with similar symptoms.  Speculates he may have rubbed his eye and irritated it.  Has NOT tried OTC eye drops.  Symptoms are made worse to the touch.  Denies similar symptoms in the past.  Denies fever, chills, nausea, vomiting, eye pain, painful eye movements, discharge, itching, vision changes, double vision, FB sensation, periorbital erythema.     Denies contact lens use.    ROS: As per HPI.  All other pertinent ROS negative.     Past Medical History:  Diagnosis Date  . Anemia    thrombocytopenia  . Atherosclerosis   . BPH (benign prostatic hyperplasia)   . Cardiomegaly   . Cirrhosis of liver ([place])    PT HAS HAD A LIVER TRANSPLANT-NASH, vaccinated for HepA/B on 05/31/11 afp 1.6, thrombus in splenic vein per ct  . Coronary artery disease 2004   s/p CAGB  . DM (diabetes mellitus) ([place])   . Erectile dysfunction   . Esophageal varices (HCC)     EGD 05/13/10, three columns grade II to III EV s/p banding, second banding since 01/2010.>Portal gastropathy.. >5 banding sessions in the past.  . GERD (gastroesophageal reflux disease)   . Gynecomastia, male    RIGHT  . History of transfusion of whole blood   . Hyperlipidemia   . Hypertension   . Hypothyroidism   . Overactive bladder   . Spleen enlarged   . Thrombocytopenia ([place])   . Thrombus    Chronic splenic, portal and smv   Past Surgical History:  Procedure Laterality Date  . CARDIAC CATHETERIZATION  10/09/06  . CATARACT EXTRACTION W/PHACO Left 04/20/2015   Procedure: CATARACT EXTRACTION PHACO AND INTRAOCULAR LENS PLACEMENT LEFT EYE CDE=14.92;  Surgeon: Tonny Branch, MD;  Location: [facility];  Service: Ophthalmology;  Laterality: Left;  . COLONOSCOPY  01/2010     portal colopathy, sigmoid AVM (nonbleeding)  . COLONOSCOPY WITH PROPOFOL N/A 12/21/2017   Dr. Gala Romney: sigmoid diverticulosis, otherwise normal  . CORONARY ARTERY BYPASS GRAFT  2004   3 vessels  . ESOPHAGOGASTRODUODENOSCOPY  01/2010   4 COLUMNS 2-3 GRADE ESOPHAGEAL VARICES,S/P BANDING,PORTAL GASTROPATHY  . ESOPHAGOGASTRODUODENOSCOPY  June 2012   Dr. Rourk-->3 columns of grade 1 esophageal varices with overlying scar, no banding treatment required, portal gastropathy. Next EGD in December 2013  . ESOPHAGOGASTRODUODENOSCOPY N/A 04/16/2012   Dr. Gala Romney: 3 columns Grade 2-3 esophageal varices, portal gastropathy, gastric antral vascular ectasia  . LIVER TRANSPLANT  01/2013  . ROTATOR CUFF REPAIR Left    No Known Allergies No current facility-administered medications on file prior to encounter.   Current Outpatient Medications on File Prior to Encounter  Medication Sig Dispense Refill  . aspirin 81 MG tablet Take 81 mg by mouth daily.    . Calcium Carbonate-Vitamin D (CALCIUM 600+D PO) Take 1 tablet by mouth daily.    . carvedilol (COREG) 6.25 MG tablet Take 6.25 mg by mouth at bedtime.     . cycloSPORINE modified (GENGRAF) 100 MG capsule Take 200-300 mg by mouth See admin instructions. Take 300mg  by mouth in the morning and 200 mg in the evening    . hydrochlorothiazide (HYDRODIURIL) 25 MG tablet Take 25 mg by mouth at bedtime.     . insulin glargine (LANTUS)  100 UNIT/ML injection Inject 14 Units into the skin daily.     Marland Kitchen levothyroxine (SYNTHROID, LEVOTHROID) 125 MCG tablet Take 137 mcg by mouth daily before breakfast.     . Magnesium Oxide (MAGOX 400 PO) Take 1,200 mg by mouth daily.     . mycophenolate (CELLCEPT) 250 MG capsule Take 250 mg by mouth 2 (two) times daily.     Marland Kitchen atorvastatin (LIPITOR) 20 MG tablet Take 20 mg by mouth at bedtime.      Social History   Socioeconomic History  . Marital status: Married    Spouse name: Not on file  . Number of children: 4  . Years of  education: Not on file  . Highest education level: Not on file  Occupational History    Employer: [employer]  Tobacco Use  . Smoking status: Former Smoker    Packs/day: 2.00    Years: 20.00    Pack years: 40.00    Types: Cigarettes    Quit date: 04/14/1974    Years since quitting: 45.2  . Smokeless tobacco: Never Used  . Tobacco comment: Quit smoking at age 74  Substance and Sexual Activity  . Alcohol use: No  . Drug use: No  . Sexual activity: Not Currently  Other Topics Concern  . Not on file  Social History Narrative  . Not on file   Social Determinants of Health   Financial Resource Strain:   . Difficulty of Paying Living Expenses:   Food Insecurity:   . Worried About Charity fundraiser in the Last Year:   . Arboriculturist in the Last Year:   Transportation Needs:   . Film/video editor (Medical):   Marland Kitchen Lack of Transportation (Non-Medical):   Physical Activity:   . Days of Exercise per Week:   . Minutes of Exercise per Session:   Stress:   . Feeling of Stress :   Social Connections:   . Frequency of Communication with Friends and Family:   . Frequency of Social Gatherings with Friends and Family:   . Attends Religious Services:   . Active Member of Clubs or Organizations:   . Attends Archivist Meetings:   Marland Kitchen Marital Status:   Intimate Partner Violence:   . Fear of Current or Ex-Partner:   . Emotionally Abused:   Marland Kitchen Physically Abused:   . Sexually Abused:    Family History  Problem Relation Age of Onset  . Stroke Father 100  . Heart disease Mother 48       CHF  . Heart attack Brother   . Liver disease Neg Hx   . Colon cancer Neg Hx     OBJECTIVE:    Visual Acuity  Right Eye Distance:  20/30 Left Eye Distance:  20/30 Bilateral Distance:  20/30   Vitals:   06/26/19 1303  BP: (!) 160/87  Pulse: 64  Resp: 18  Temp: (!) 97.5 F (36.4 C)  TempSrc: Oral  SpO2: 97%  Weight: 168 lb (76.2 kg)  Height: 5\' 7"  (1.702 m)     General appearance: alert; no distress Eyes: LT lower eyelid with mild erythema, inner eyelid erythematous; trace conjunctival erythema. PERRL; EOMI without discomfort;  no obvious drainage Neck: supple Lungs: clear to auscultation bilaterally Heart: regular rate and rhythm Skin: warm and dry Psychological: alert and cooperative; normal mood and affect   ASSESSMENT & PLAN:  1. Blepharitis of left lower eyelid, unspecified type   2. Irritation of eyelid  Meds ordered this encounter  Medications  . erythromycin ophthalmic ointment    Sig: Place a 1/2 inch ribbon of ointment into/onto the lower eyelid.    Dispense:  3.5 g    Refill:  0    Order Specific Question:   Supervising Provider    Answer:   Raylene Everts [id number]   May perform warm compresses at home.  Soak a wash cloth in warm (not scalding) water and place it over the eyes. As the wash cloth cools, it should be rewarmed and replaced for a total of 5 to 10 minutes of soaking time. Warm compresses should be applied two to four times a day as long as the patient has symptoms Perform lid washing: Either warm water or very dilute baby shampoo can be placed on a clean wash cloth, gauze pad, or cotton swab. Then be advised to gently clean along the lashes and lid margin to remove the accumulated material with care to avoid contacting the ocular surface. If shampoo is used, thorough rinsing is recommended. Vigorous washing should be avoided, as it may cause more irritation.  Prescribed erythromycin ointment.  Apply up to 6 times daily for 5-7 days, or until symptomatic improvement Follow up with ophthalmology in 1-2 days if not having improvement with ointmetn Return or go to ER if you have any new or worsening symptoms such as fever, chills, redness, swelling, eye pain, painful eye movements, vision changes, etc...  Reviewed expectations re: course of current medical issues. Questions answered. Outlined signs and symptoms  indicating need for more acute intervention. Patient verbalized understanding. After Visit Summary given.   Lestine Box, PA-C 06/26/19 1321

## 2019-06-26 NOTE — ED Triage Notes (Signed)
Pt reports left eye irritation and discomfort for past several days.  Denies any vision changes.

## 2019-06-26 NOTE — Discharge Instructions (Signed)
May perform warm compresses at home.  Soak a wash cloth in warm (not scalding) water and place it over the eyes. As the wash cloth cools, it should be rewarmed and replaced for a total of 5 to 10 minutes of soaking time. Warm compresses should be applied two to four times a day as long as the patient has symptoms Perform lid washing: Either warm water or very dilute baby shampoo can be placed on a clean wash cloth, gauze pad, or cotton swab. Then be advised to gently clean along the lashes and lid margin to remove the accumulated material with care to avoid contacting the ocular surface. If shampoo is used, thorough rinsing is recommended. Vigorous washing should be avoided, as it may cause more irritation.  Prescribed erythromycin ointment.  Apply up to 6 times daily for 5-7 days, or until symptomatic improvement Follow up with ophthalmology in 1-2 days if not having improvement with ointmetn Return or go to ER if you have any new or worsening symptoms such as fever, chills, redness, swelling, eye pain, painful eye movements, vision changes, etc..Marland Kitchen

## 2019-07-01 DIAGNOSIS — Z08 Encounter for follow-up examination after completed treatment for malignant neoplasm: Secondary | ICD-10-CM | POA: Diagnosis not present

## 2019-07-01 DIAGNOSIS — L570 Actinic keratosis: Secondary | ICD-10-CM | POA: Diagnosis not present

## 2019-07-01 DIAGNOSIS — Z85828 Personal history of other malignant neoplasm of skin: Secondary | ICD-10-CM | POA: Diagnosis not present

## 2019-07-01 DIAGNOSIS — X32XXXD Exposure to sunlight, subsequent encounter: Secondary | ICD-10-CM | POA: Diagnosis not present

## 2019-07-01 DIAGNOSIS — D0439 Carcinoma in situ of skin of other parts of face: Secondary | ICD-10-CM | POA: Diagnosis not present

## 2019-07-09 DIAGNOSIS — I361 Nonrheumatic tricuspid (valve) insufficiency: Secondary | ICD-10-CM | POA: Diagnosis not present

## 2019-07-09 DIAGNOSIS — I272 Pulmonary hypertension, unspecified: Secondary | ICD-10-CM | POA: Diagnosis not present

## 2019-07-09 DIAGNOSIS — I081 Rheumatic disorders of both mitral and tricuspid valves: Secondary | ICD-10-CM | POA: Diagnosis not present

## 2019-07-09 DIAGNOSIS — I0989 Other specified rheumatic heart diseases: Secondary | ICD-10-CM | POA: Diagnosis not present

## 2019-07-11 DIAGNOSIS — H1032 Unspecified acute conjunctivitis, left eye: Secondary | ICD-10-CM | POA: Diagnosis not present

## 2019-07-16 DIAGNOSIS — I81 Portal vein thrombosis: Secondary | ICD-10-CM | POA: Diagnosis not present

## 2019-07-16 DIAGNOSIS — I2510 Atherosclerotic heart disease of native coronary artery without angina pectoris: Secondary | ICD-10-CM | POA: Diagnosis not present

## 2019-07-16 DIAGNOSIS — I361 Nonrheumatic tricuspid (valve) insufficiency: Secondary | ICD-10-CM | POA: Diagnosis not present

## 2019-07-16 DIAGNOSIS — I10 Essential (primary) hypertension: Secondary | ICD-10-CM | POA: Diagnosis not present

## 2019-07-16 DIAGNOSIS — I493 Ventricular premature depolarization: Secondary | ICD-10-CM | POA: Diagnosis not present

## 2019-08-01 DIAGNOSIS — I10 Essential (primary) hypertension: Secondary | ICD-10-CM | POA: Diagnosis not present

## 2019-08-06 ENCOUNTER — Encounter: Payer: Self-pay | Admitting: Internal Medicine

## 2019-08-20 DIAGNOSIS — I10 Essential (primary) hypertension: Secondary | ICD-10-CM | POA: Diagnosis not present

## 2019-08-22 DIAGNOSIS — I129 Hypertensive chronic kidney disease with stage 1 through stage 4 chronic kidney disease, or unspecified chronic kidney disease: Secondary | ICD-10-CM | POA: Diagnosis not present

## 2019-08-22 DIAGNOSIS — I25810 Atherosclerosis of coronary artery bypass graft(s) without angina pectoris: Secondary | ICD-10-CM | POA: Diagnosis not present

## 2019-10-02 DIAGNOSIS — Z7984 Long term (current) use of oral hypoglycemic drugs: Secondary | ICD-10-CM | POA: Diagnosis not present

## 2019-10-02 DIAGNOSIS — H1045 Other chronic allergic conjunctivitis: Secondary | ICD-10-CM | POA: Diagnosis not present

## 2019-10-02 DIAGNOSIS — E119 Type 2 diabetes mellitus without complications: Secondary | ICD-10-CM | POA: Diagnosis not present

## 2019-10-02 DIAGNOSIS — Z794 Long term (current) use of insulin: Secondary | ICD-10-CM | POA: Diagnosis not present

## 2019-10-02 DIAGNOSIS — H02125 Mechanical ectropion of left lower eyelid: Secondary | ICD-10-CM | POA: Diagnosis not present

## 2019-10-15 ENCOUNTER — Encounter: Payer: Self-pay | Admitting: Internal Medicine

## 2019-10-15 ENCOUNTER — Other Ambulatory Visit: Payer: Self-pay

## 2019-10-15 ENCOUNTER — Ambulatory Visit: Payer: Medicare Other | Admitting: Internal Medicine

## 2019-10-15 VITALS — BP 138/72 | HR 64 | Temp 97.3°F | Ht 67.0 in | Wt 178.8 lb

## 2019-10-15 DIAGNOSIS — Z944 Liver transplant status: Secondary | ICD-10-CM

## 2019-10-15 NOTE — Progress Notes (Signed)
Primary Care Physician:  Celene Squibb, MD Primary Gastroenterologist:  Dr. Gala Romney  Pre-Procedure History & Physical: HPI:  [name] is a 74 y.o. male here for follow-up.  History of orthotopic liver transplant about 7 years ago now.  Chronic kidney disease stage IIIb.  Coronary artery disease.  He is followed closely by specialist dowdy at [facility] including hepatology clinic.  He is done very very well.  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  Enjoying life.  He states he is doing very well and has no GI complaints.  Up-to-date on screening colonoscopy as of 2019.  Colonoscopy is not recommended  Past Medical History:  Diagnosis Date  . Anemia    thrombocytopenia  . Atherosclerosis   . BPH (benign prostatic hyperplasia)   . Cardiomegaly   . Cirrhosis of liver ([place])    PT HAS HAD A LIVER TRANSPLANT-NASH, vaccinated for HepA/B on 05/31/11 afp 1.6, thrombus in splenic vein per ct  . Coronary artery disease 2004   s/p CAGB  . DM (diabetes mellitus) ([place])   . Erectile dysfunction   . Esophageal varices (HCC)     EGD 05/13/10, three columns grade II to III EV s/p banding, second banding since 01/2010.>Portal gastropathy.. >5 banding sessions in the past.  . GERD (gastroesophageal reflux disease)   . Gynecomastia, male    RIGHT  . History of transfusion of whole blood   . Hyperlipidemia   . Hypertension   . Hypothyroidism   . Overactive bladder   . Spleen enlarged   . Thrombocytopenia ([place])   . Thrombus    Chronic splenic, portal and smv    Past Surgical History:  Procedure Laterality Date  . CARDIAC CATHETERIZATION  10/09/06  . CATARACT EXTRACTION W/PHACO Left 04/20/2015   Procedure: CATARACT EXTRACTION PHACO AND INTRAOCULAR LENS PLACEMENT LEFT EYE CDE=14.92;  Surgeon: Tonny Branch, MD;  Location: [facility];  Service: Ophthalmology;  Laterality: Left;  . COLONOSCOPY  01/2010   portal colopathy, sigmoid AVM (nonbleeding)  .  COLONOSCOPY WITH PROPOFOL N/A 12/21/2017   Dr. Gala Romney: sigmoid diverticulosis, otherwise normal  . CORONARY ARTERY BYPASS GRAFT  2004   3 vessels  . ESOPHAGOGASTRODUODENOSCOPY  01/2010   4 COLUMNS 2-3 GRADE ESOPHAGEAL VARICES,S/P BANDING,PORTAL GASTROPATHY  . ESOPHAGOGASTRODUODENOSCOPY  June 2012   Dr. Masami Plata-->3 columns of grade 1 esophageal varices with overlying scar, no banding treatment required, portal gastropathy. Next EGD in December 2013  . ESOPHAGOGASTRODUODENOSCOPY N/A 04/16/2012   Dr. Gala Romney: 3 columns Grade 2-3 esophageal varices, portal gastropathy, gastric antral vascular ectasia  . LIVER TRANSPLANT  01/2013  . ROTATOR CUFF REPAIR Left     Prior to Admission medications   Medication Sig Start Date End Date Taking? Authorizing Provider  aspirin 81 MG tablet Take 81 mg by mouth daily.   Yes [provider]  atorvastatin (LIPITOR) 20 MG tablet Take 20 mg by mouth at bedtime.    Yes [provider]  Calcium Carbonate-Vitamin D (CALCIUM 600+D PO) Take 1 tablet by mouth daily.   Yes [provider]  carvedilol (COREG) 6.25 MG tablet Take 6.25 mg by mouth at bedtime.    Yes [provider]  cycloSPORINE modified (GENGRAF) 100 MG capsule Take 200-300 mg by mouth See admin instructions. Take 300mg  by mouth in the morning and 200 mg in the evening   Yes [provider]  ENTRESTO 24-26 MG Take 1 tablet by mouth 2 (two)  times daily. 09/21/19  Yes [provider]  hydrochlorothiazide (HYDRODIURIL) 25 MG tablet Take 25 mg by mouth at bedtime.    Yes [provider]  insulin glargine (LANTUS) 100 UNIT/ML injection Inject 14 Units into the skin daily.    Yes [provider]  levothyroxine (SYNTHROID, LEVOTHROID) 125 MCG tablet Take 137 mcg by mouth daily before breakfast.    Yes [provider]  Magnesium Oxide (MAGOX 400 PO) Take 1,200 mg by mouth daily.    Yes [provider]  mycophenolate (CELLCEPT) 250  MG capsule Take 250 mg by mouth 2 (two) times daily.    Yes [provider]  erythromycin ophthalmic ointment Place a 1/2 inch ribbon of ointment into/onto the lower eyelid. Patient not taking: Reported on 10/15/2019 06/26/19   Stacey Drain [place], PA-C    Allergies as of 10/15/2019  . (No Known Allergies)    Family History  Problem Relation Age of Onset  . Stroke Father 39  . Heart disease Mother 54       CHF  . Heart attack Brother   . Liver disease Neg Hx   . Colon cancer Neg Hx     Social History   Socioeconomic History  . Marital status: Married    Spouse name: Not on file  . Number of children: 4  . Years of education: Not on file  . Highest education level: Not on file  Occupational History    Employer: [employer]  Tobacco Use  . Smoking status: Former Smoker    Packs/day: 2.00    Years: 20.00    Pack years: 40.00    Types: Cigarettes    Quit date: 04/14/1974    Years since quitting: 45.5  . Smokeless tobacco: Never Used  . Tobacco comment: Quit smoking at age 31  Vaping Use  . Vaping Use: Never used  Substance and Sexual Activity  . Alcohol use: No  . Drug use: No  . Sexual activity: Not Currently  Other Topics Concern  . Not on file  Social History Narrative  . Not on file   Social Determinants of Health   Financial Resource Strain:   . Difficulty of Paying Living Expenses:   Food Insecurity:   . Worried About Charity fundraiser in the Last Year:   . Arboriculturist in the Last Year:   Transportation Needs:   . Film/video editor (Medical):   Marland Kitchen Lack of Transportation (Non-Medical):   Physical Activity:   . Days of Exercise per Week:   . Minutes of Exercise per Session:   Stress:   . Feeling of Stress :   Social Connections:   . Frequency of Communication with Friends and Family:   . Frequency of Social Gatherings with Friends and Family:   . Attends Religious Services:   . Active Member of Clubs or Organizations:     . Attends Archivist Meetings:   Marland Kitchen Marital Status:   Intimate Partner Violence:   . Fear of Current or Ex-Partner:   . Emotionally Abused:   Marland Kitchen Physically Abused:   . Sexually Abused:     Review of Systems: See HPI, otherwise negative ROS  Physical Exam: BP 138/72   Pulse 64   Temp (!) 97.3 F (36.3 C) (Temporal)   Ht 5\' 7"  (1.702 m)   Wt 178 lb 12.8 oz (81.1 kg)   BMI 28.00 kg/m  General:   Alert,  pleasant and cooperative in  NAD Skin: Multiple tattoos eyes:  Sclera clear, no icterus.   Conjunctiva pink.  Neck:  Supple; no masses or thyromegaly. No significant cervical adenopathy. Lungs:  Clear throughout to auscultation.   No wheezes, crackles, or rhonchi. No acute distress. Heart:  Regular rate and rhythm; no murmurs, clicks, rubs,  or gallops. Abdomen: Non-distended, normal bowel sounds.  Well-healed transplant scar.  Soft and nontender without appreciable mass or hepatosplenomegaly.  Pulses:  Normal pulses noted. Extremities:  Without clubbing or edema.  Impression/Plan: 74 year old gentleman status post orthotopic liver transplantation for Nash cirrhosis.  Doing very well.  His chronic kidney disease and CAD followed closely by specialist at [facility].  Concerns today.   Recommendations:  Patient is doing very well things considered.  .  Continue follow-up with your liver, heart and kidney doctors  I'll see you back in 1 year and as needed   Notice: This dictation was prepared with Dragon dictation along with smaller phrase technology. Any transcriptional errors that result from this process are unintentional and may not be corrected upon review.

## 2019-10-15 NOTE — Patient Instructions (Addendum)
You are doing very well overall.  Continue follow-up with your liver, heart and kidney doctors  I'll see you back in 1 year and as needed

## 2019-10-18 DIAGNOSIS — I5042 Chronic combined systolic (congestive) and diastolic (congestive) heart failure: Secondary | ICD-10-CM | POA: Diagnosis not present

## 2019-10-18 DIAGNOSIS — I2510 Atherosclerotic heart disease of native coronary artery without angina pectoris: Secondary | ICD-10-CM | POA: Diagnosis not present

## 2019-10-18 DIAGNOSIS — Z951 Presence of aortocoronary bypass graft: Secondary | ICD-10-CM | POA: Diagnosis not present

## 2019-10-18 DIAGNOSIS — I10 Essential (primary) hypertension: Secondary | ICD-10-CM | POA: Diagnosis not present

## 2019-10-18 DIAGNOSIS — I361 Nonrheumatic tricuspid (valve) insufficiency: Secondary | ICD-10-CM | POA: Diagnosis not present

## 2019-10-22 DIAGNOSIS — Z944 Liver transplant status: Secondary | ICD-10-CM | POA: Diagnosis not present

## 2019-10-22 DIAGNOSIS — R7989 Other specified abnormal findings of blood chemistry: Secondary | ICD-10-CM | POA: Diagnosis not present

## 2019-10-22 DIAGNOSIS — D849 Immunodeficiency, unspecified: Secondary | ICD-10-CM | POA: Diagnosis not present

## 2019-10-22 DIAGNOSIS — E875 Hyperkalemia: Secondary | ICD-10-CM | POA: Diagnosis not present

## 2019-10-31 DIAGNOSIS — H60502 Unspecified acute noninfective otitis externa, left ear: Secondary | ICD-10-CM | POA: Diagnosis not present

## 2019-10-31 DIAGNOSIS — Z23 Encounter for immunization: Secondary | ICD-10-CM | POA: Diagnosis not present

## 2019-10-31 DIAGNOSIS — C44629 Squamous cell carcinoma of skin of left upper limb, including shoulder: Secondary | ICD-10-CM | POA: Diagnosis not present

## 2019-10-31 DIAGNOSIS — E1122 Type 2 diabetes mellitus with diabetic chronic kidney disease: Secondary | ICD-10-CM | POA: Diagnosis not present

## 2019-10-31 DIAGNOSIS — Z712 Person consulting for explanation of examination or test findings: Secondary | ICD-10-CM | POA: Diagnosis not present

## 2019-10-31 DIAGNOSIS — I129 Hypertensive chronic kidney disease with stage 1 through stage 4 chronic kidney disease, or unspecified chronic kidney disease: Secondary | ICD-10-CM | POA: Diagnosis not present

## 2019-10-31 DIAGNOSIS — C44729 Squamous cell carcinoma of skin of left lower limb, including hip: Secondary | ICD-10-CM | POA: Diagnosis not present

## 2019-11-05 DIAGNOSIS — E1122 Type 2 diabetes mellitus with diabetic chronic kidney disease: Secondary | ICD-10-CM | POA: Diagnosis not present

## 2019-11-05 DIAGNOSIS — E782 Mixed hyperlipidemia: Secondary | ICD-10-CM | POA: Diagnosis not present

## 2019-11-05 DIAGNOSIS — I2510 Atherosclerotic heart disease of native coronary artery without angina pectoris: Secondary | ICD-10-CM | POA: Diagnosis not present

## 2019-11-05 DIAGNOSIS — E039 Hypothyroidism, unspecified: Secondary | ICD-10-CM | POA: Diagnosis not present

## 2019-11-05 DIAGNOSIS — Z944 Liver transplant status: Secondary | ICD-10-CM | POA: Diagnosis not present

## 2019-11-18 IMAGING — DX DG HAND COMPLETE 3+V*L*
3 series · 3 of 3 positions shown · non-contrast
Comparison: None.

CLINICAL DATA: Left hand laceration after injury today.

EXAM:
LEFT HAND - COMPLETE 3+ VIEW

[hand obl]
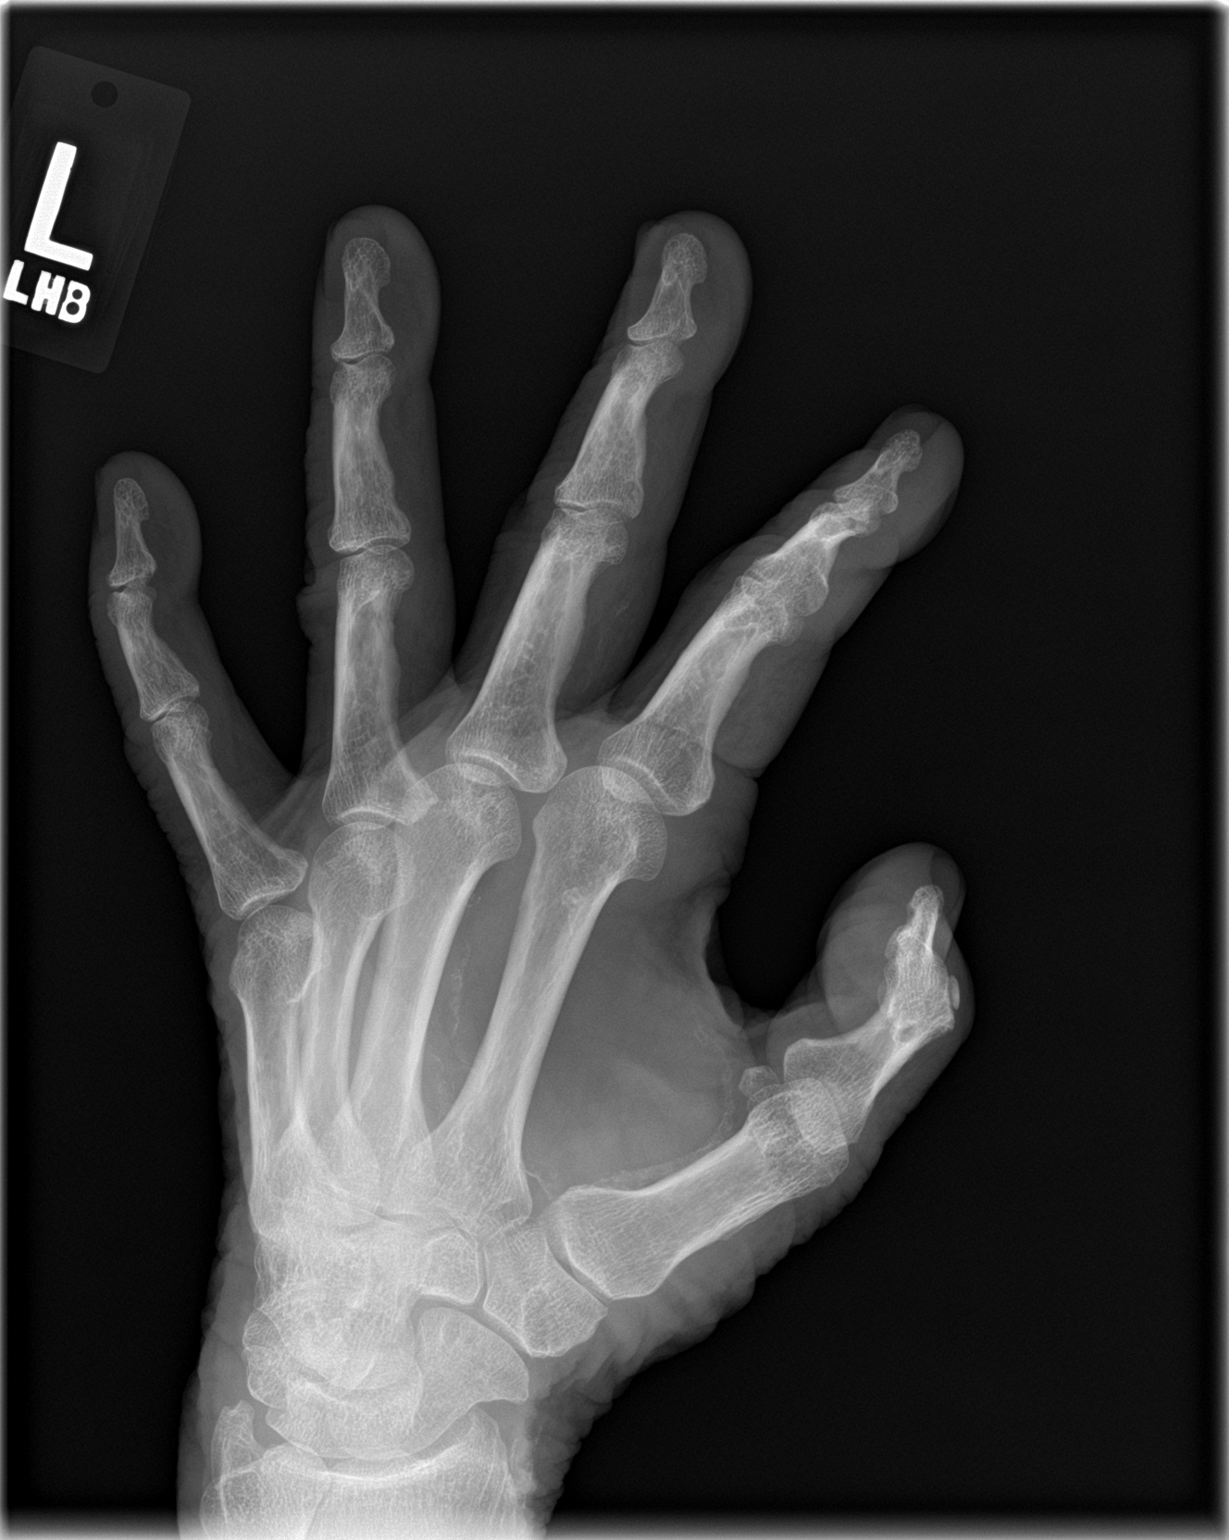

[hand lat]
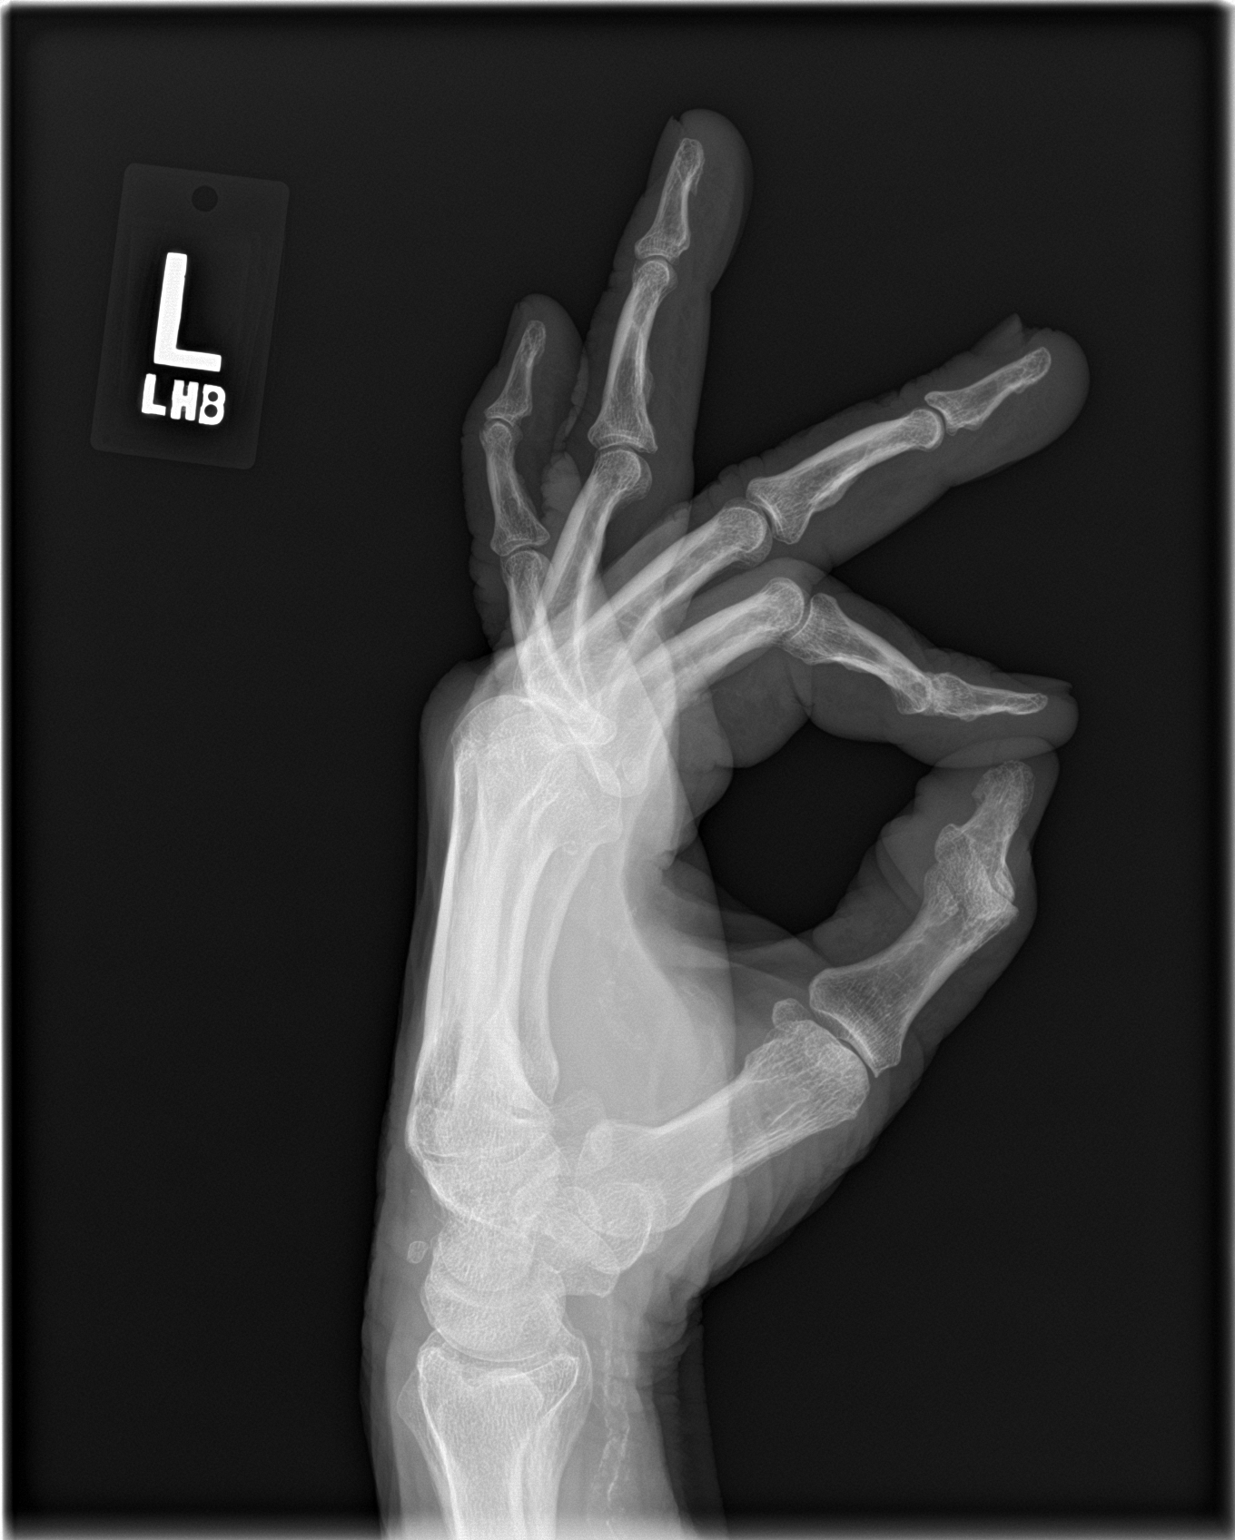

[hand pa]
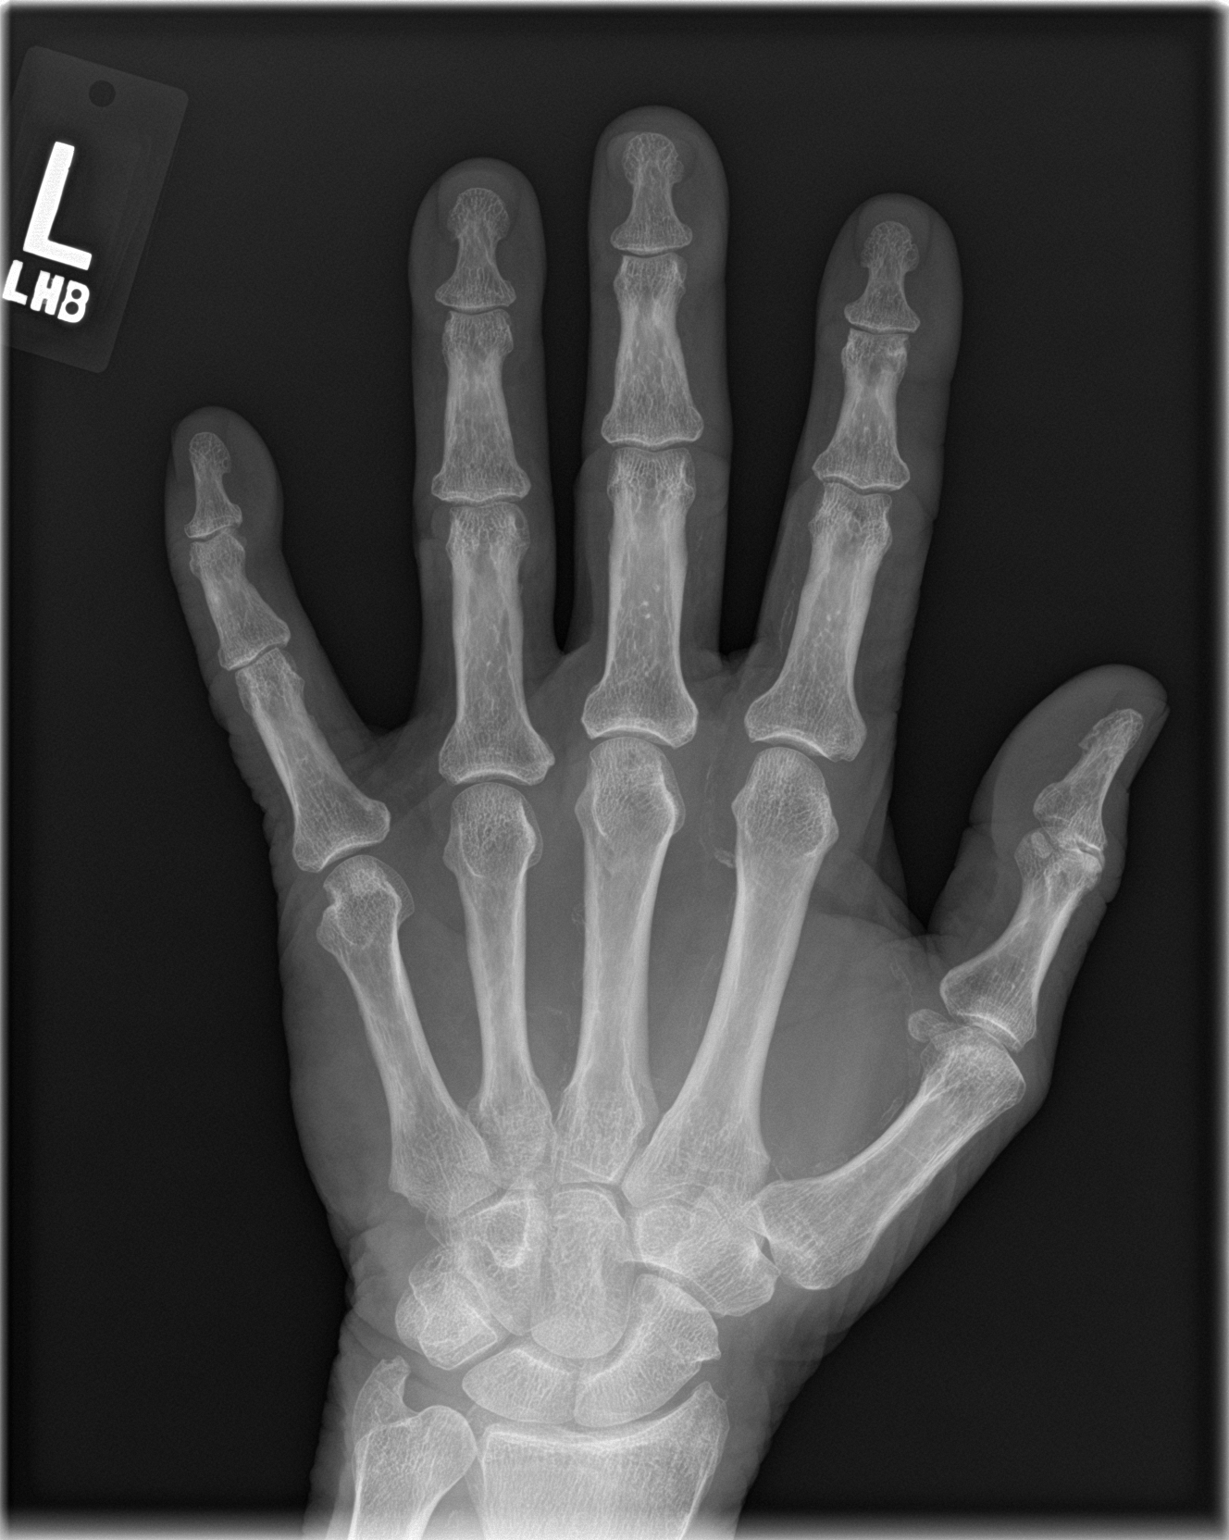

[3 of 3 positions shown; findings below may reference images not displayed]

FINDINGS: There is no evidence of fracture or dislocation. There is no
evidence of arthropathy or other focal bone abnormality. Vascular
calcifications are noted.
IMPRESSION: No acute abnormality seen in the left hand.

## 2019-12-12 DIAGNOSIS — Z08 Encounter for follow-up examination after completed treatment for malignant neoplasm: Secondary | ICD-10-CM | POA: Diagnosis not present

## 2019-12-12 DIAGNOSIS — L570 Actinic keratosis: Secondary | ICD-10-CM | POA: Diagnosis not present

## 2019-12-12 DIAGNOSIS — S20461A Insect bite (nonvenomous) of right back wall of thorax, initial encounter: Secondary | ICD-10-CM | POA: Diagnosis not present

## 2019-12-12 DIAGNOSIS — Z85828 Personal history of other malignant neoplasm of skin: Secondary | ICD-10-CM | POA: Diagnosis not present

## 2019-12-12 DIAGNOSIS — X32XXXD Exposure to sunlight, subsequent encounter: Secondary | ICD-10-CM | POA: Diagnosis not present

## 2020-01-24 DIAGNOSIS — Z23 Encounter for immunization: Secondary | ICD-10-CM | POA: Diagnosis not present

## 2020-01-24 DIAGNOSIS — Z136 Encounter for screening for cardiovascular disorders: Secondary | ICD-10-CM | POA: Diagnosis not present

## 2020-01-24 DIAGNOSIS — I10 Essential (primary) hypertension: Secondary | ICD-10-CM | POA: Diagnosis not present

## 2020-01-24 DIAGNOSIS — R002 Palpitations: Secondary | ICD-10-CM | POA: Diagnosis not present

## 2020-02-06 DIAGNOSIS — D899 Disorder involving the immune mechanism, unspecified: Secondary | ICD-10-CM | POA: Diagnosis not present

## 2020-02-06 DIAGNOSIS — Z944 Liver transplant status: Secondary | ICD-10-CM | POA: Diagnosis not present

## 2020-02-07 DIAGNOSIS — H60502 Unspecified acute noninfective otitis externa, left ear: Secondary | ICD-10-CM | POA: Diagnosis not present

## 2020-02-07 DIAGNOSIS — I10 Essential (primary) hypertension: Secondary | ICD-10-CM | POA: Diagnosis not present

## 2020-02-07 DIAGNOSIS — Z23 Encounter for immunization: Secondary | ICD-10-CM | POA: Diagnosis not present

## 2020-02-07 DIAGNOSIS — Z712 Person consulting for explanation of examination or test findings: Secondary | ICD-10-CM | POA: Diagnosis not present

## 2020-03-12 DIAGNOSIS — D1801 Hemangioma of skin and subcutaneous tissue: Secondary | ICD-10-CM | POA: Diagnosis not present

## 2020-03-12 DIAGNOSIS — X32XXXD Exposure to sunlight, subsequent encounter: Secondary | ICD-10-CM | POA: Diagnosis not present

## 2020-03-12 DIAGNOSIS — L570 Actinic keratosis: Secondary | ICD-10-CM | POA: Diagnosis not present

## 2020-03-12 DIAGNOSIS — C44121 Squamous cell carcinoma of skin of unspecified eyelid, including canthus: Secondary | ICD-10-CM | POA: Diagnosis not present

## 2020-03-12 DIAGNOSIS — C44329 Squamous cell carcinoma of skin of other parts of face: Secondary | ICD-10-CM | POA: Diagnosis not present

## 2020-03-26 DIAGNOSIS — C4442 Squamous cell carcinoma of skin of scalp and neck: Secondary | ICD-10-CM | POA: Diagnosis not present

## 2020-03-26 DIAGNOSIS — C441292 Squamous cell carcinoma of skin of left lower eyelid, including canthus: Secondary | ICD-10-CM | POA: Diagnosis not present

## 2020-04-08 DIAGNOSIS — M79671 Pain in right foot: Secondary | ICD-10-CM | POA: Diagnosis not present

## 2020-04-08 DIAGNOSIS — M7661 Achilles tendinitis, right leg: Secondary | ICD-10-CM | POA: Diagnosis not present

## 2020-04-08 DIAGNOSIS — M9261 Juvenile osteochondrosis of tarsus, right ankle: Secondary | ICD-10-CM | POA: Diagnosis not present

## 2020-04-15 DIAGNOSIS — C441292 Squamous cell carcinoma of skin of left lower eyelid, including canthus: Secondary | ICD-10-CM | POA: Diagnosis not present

## 2020-04-20 DIAGNOSIS — Z944 Liver transplant status: Secondary | ICD-10-CM | POA: Diagnosis not present

## 2020-04-20 DIAGNOSIS — N189 Chronic kidney disease, unspecified: Secondary | ICD-10-CM | POA: Diagnosis not present

## 2020-04-20 DIAGNOSIS — D849 Immunodeficiency, unspecified: Secondary | ICD-10-CM | POA: Diagnosis not present

## 2020-04-30 DIAGNOSIS — D899 Disorder involving the immune mechanism, unspecified: Secondary | ICD-10-CM | POA: Diagnosis not present

## 2020-04-30 DIAGNOSIS — Z944 Liver transplant status: Secondary | ICD-10-CM | POA: Diagnosis not present

## 2020-05-11 DIAGNOSIS — C441292 Squamous cell carcinoma of skin of left lower eyelid, including canthus: Secondary | ICD-10-CM | POA: Diagnosis not present

## 2020-05-11 DIAGNOSIS — Z944 Liver transplant status: Secondary | ICD-10-CM | POA: Insufficient documentation

## 2020-05-19 DIAGNOSIS — C441292 Squamous cell carcinoma of skin of left lower eyelid, including canthus: Secondary | ICD-10-CM | POA: Diagnosis not present

## 2020-05-19 DIAGNOSIS — L570 Actinic keratosis: Secondary | ICD-10-CM | POA: Diagnosis not present

## 2020-05-20 DIAGNOSIS — S0181XS Laceration without foreign body of other part of head, sequela: Secondary | ICD-10-CM | POA: Diagnosis not present

## 2020-05-20 DIAGNOSIS — S01112S Laceration without foreign body of left eyelid and periocular area, sequela: Secondary | ICD-10-CM | POA: Diagnosis not present

## 2020-05-20 DIAGNOSIS — H0259 Other disorders affecting eyelid function: Secondary | ICD-10-CM | POA: Diagnosis not present

## 2020-05-20 DIAGNOSIS — Z481 Encounter for planned postprocedural wound closure: Secondary | ICD-10-CM | POA: Diagnosis not present

## 2020-05-20 DIAGNOSIS — L988 Other specified disorders of the skin and subcutaneous tissue: Secondary | ICD-10-CM | POA: Diagnosis not present

## 2020-05-20 DIAGNOSIS — C441292 Squamous cell carcinoma of skin of left lower eyelid, including canthus: Secondary | ICD-10-CM | POA: Diagnosis not present

## 2020-05-20 DIAGNOSIS — Z483 Aftercare following surgery for neoplasm: Secondary | ICD-10-CM | POA: Diagnosis not present

## 2020-05-20 DIAGNOSIS — Z85828 Personal history of other malignant neoplasm of skin: Secondary | ICD-10-CM | POA: Diagnosis not present

## 2020-05-20 DIAGNOSIS — S01111S Laceration without foreign body of right eyelid and periocular area, sequela: Secondary | ICD-10-CM | POA: Diagnosis not present

## 2020-06-05 DIAGNOSIS — I361 Nonrheumatic tricuspid (valve) insufficiency: Secondary | ICD-10-CM | POA: Diagnosis not present

## 2020-06-05 DIAGNOSIS — I10 Essential (primary) hypertension: Secondary | ICD-10-CM | POA: Diagnosis not present

## 2020-06-05 DIAGNOSIS — I2510 Atherosclerotic heart disease of native coronary artery without angina pectoris: Secondary | ICD-10-CM | POA: Diagnosis not present

## 2020-06-05 DIAGNOSIS — Z951 Presence of aortocoronary bypass graft: Secondary | ICD-10-CM | POA: Diagnosis not present

## 2020-06-05 DIAGNOSIS — I493 Ventricular premature depolarization: Secondary | ICD-10-CM | POA: Diagnosis not present

## 2020-06-05 DIAGNOSIS — I5042 Chronic combined systolic (congestive) and diastolic (congestive) heart failure: Secondary | ICD-10-CM | POA: Diagnosis not present

## 2020-06-10 DIAGNOSIS — C44629 Squamous cell carcinoma of skin of left upper limb, including shoulder: Secondary | ICD-10-CM | POA: Diagnosis not present

## 2020-06-10 DIAGNOSIS — L570 Actinic keratosis: Secondary | ICD-10-CM | POA: Diagnosis not present

## 2020-06-10 DIAGNOSIS — C44712 Basal cell carcinoma of skin of right lower limb, including hip: Secondary | ICD-10-CM | POA: Diagnosis not present

## 2020-06-10 DIAGNOSIS — D899 Disorder involving the immune mechanism, unspecified: Secondary | ICD-10-CM | POA: Diagnosis not present

## 2020-06-10 DIAGNOSIS — X32XXXD Exposure to sunlight, subsequent encounter: Secondary | ICD-10-CM | POA: Diagnosis not present

## 2020-06-10 DIAGNOSIS — Z944 Liver transplant status: Secondary | ICD-10-CM | POA: Diagnosis not present

## 2020-06-11 DIAGNOSIS — I129 Hypertensive chronic kidney disease with stage 1 through stage 4 chronic kidney disease, or unspecified chronic kidney disease: Secondary | ICD-10-CM | POA: Diagnosis not present

## 2020-06-11 DIAGNOSIS — E039 Hypothyroidism, unspecified: Secondary | ICD-10-CM | POA: Diagnosis not present

## 2020-06-11 DIAGNOSIS — E1122 Type 2 diabetes mellitus with diabetic chronic kidney disease: Secondary | ICD-10-CM | POA: Diagnosis not present

## 2020-06-11 DIAGNOSIS — E782 Mixed hyperlipidemia: Secondary | ICD-10-CM | POA: Diagnosis not present

## 2020-06-11 DIAGNOSIS — K7581 Nonalcoholic steatohepatitis (NASH): Secondary | ICD-10-CM | POA: Diagnosis not present

## 2020-06-11 DIAGNOSIS — N1832 Chronic kidney disease, stage 3b: Secondary | ICD-10-CM | POA: Diagnosis not present

## 2020-06-11 DIAGNOSIS — D696 Thrombocytopenia, unspecified: Secondary | ICD-10-CM | POA: Diagnosis not present

## 2020-06-11 DIAGNOSIS — Z944 Liver transplant status: Secondary | ICD-10-CM | POA: Diagnosis not present

## 2020-06-11 DIAGNOSIS — I2510 Atherosclerotic heart disease of native coronary artery without angina pectoris: Secondary | ICD-10-CM | POA: Diagnosis not present

## 2020-06-17 DIAGNOSIS — Z481 Encounter for planned postprocedural wound closure: Secondary | ICD-10-CM | POA: Diagnosis not present

## 2020-06-17 DIAGNOSIS — L905 Scar conditions and fibrosis of skin: Secondary | ICD-10-CM | POA: Diagnosis not present

## 2020-06-17 DIAGNOSIS — Z483 Aftercare following surgery for neoplasm: Secondary | ICD-10-CM | POA: Diagnosis not present

## 2020-06-17 DIAGNOSIS — C441292 Squamous cell carcinoma of skin of left lower eyelid, including canthus: Secondary | ICD-10-CM | POA: Diagnosis not present

## 2020-06-23 ENCOUNTER — Encounter (HOSPITAL_COMMUNITY): Payer: Self-pay | Admitting: *Deleted

## 2020-06-23 ENCOUNTER — Encounter: Payer: Self-pay | Admitting: Emergency Medicine

## 2020-06-23 ENCOUNTER — Inpatient Hospital Stay (HOSPITAL_COMMUNITY)
Admission: EM | Admit: 2020-06-23 | Discharge: 2020-06-25 | DRG: 682 | Disposition: A | Discharge status: Home / Self Care | Payer: Medicare Other | Attending: Internal Medicine | Admitting: Internal Medicine

## 2020-06-23 ENCOUNTER — Other Ambulatory Visit: Payer: Self-pay

## 2020-06-23 ENCOUNTER — Ambulatory Visit
Admission: EM | Admit: 2020-06-23 | Discharge: 2020-06-23 | Disposition: A | Discharge status: Home / Self Care | Payer: Medicare Other | Attending: Family Medicine | Admitting: Family Medicine

## 2020-06-23 ENCOUNTER — Emergency Department (HOSPITAL_COMMUNITY): Payer: Medicare Other

## 2020-06-23 ENCOUNTER — Ambulatory Visit (INDEPENDENT_AMBULATORY_CARE_PROVIDER_SITE_OTHER): Payer: Medicare Other

## 2020-06-23 DIAGNOSIS — I10 Essential (primary) hypertension: Secondary | ICD-10-CM | POA: Diagnosis not present

## 2020-06-23 DIAGNOSIS — Z87891 Personal history of nicotine dependence: Secondary | ICD-10-CM

## 2020-06-23 DIAGNOSIS — N179 Acute kidney failure, unspecified: Principal | ICD-10-CM | POA: Diagnosis present

## 2020-06-23 DIAGNOSIS — J189 Pneumonia, unspecified organism: Principal | ICD-10-CM | POA: Diagnosis present

## 2020-06-23 DIAGNOSIS — N400 Enlarged prostate without lower urinary tract symptoms: Secondary | ICD-10-CM | POA: Diagnosis present

## 2020-06-23 DIAGNOSIS — D849 Immunodeficiency, unspecified: Secondary | ICD-10-CM

## 2020-06-23 DIAGNOSIS — Z7689 Persons encountering health services in other specified circumstances: Secondary | ICD-10-CM | POA: Diagnosis not present

## 2020-06-23 DIAGNOSIS — K219 Gastro-esophageal reflux disease without esophagitis: Secondary | ICD-10-CM | POA: Diagnosis not present

## 2020-06-23 DIAGNOSIS — J90 Pleural effusion, not elsewhere classified: Secondary | ICD-10-CM | POA: Diagnosis not present

## 2020-06-23 DIAGNOSIS — Z823 Family history of stroke: Secondary | ICD-10-CM | POA: Diagnosis not present

## 2020-06-23 DIAGNOSIS — R059 Cough, unspecified: Secondary | ICD-10-CM

## 2020-06-23 DIAGNOSIS — Z794 Long term (current) use of insulin: Secondary | ICD-10-CM

## 2020-06-23 DIAGNOSIS — E039 Hypothyroidism, unspecified: Secondary | ICD-10-CM | POA: Diagnosis not present

## 2020-06-23 DIAGNOSIS — E785 Hyperlipidemia, unspecified: Secondary | ICD-10-CM | POA: Diagnosis present

## 2020-06-23 DIAGNOSIS — Z79899 Other long term (current) drug therapy: Secondary | ICD-10-CM | POA: Diagnosis not present

## 2020-06-23 DIAGNOSIS — Z951 Presence of aortocoronary bypass graft: Secondary | ICD-10-CM | POA: Diagnosis not present

## 2020-06-23 DIAGNOSIS — Z8249 Family history of ischemic heart disease and other diseases of the circulatory system: Secondary | ICD-10-CM | POA: Diagnosis not present

## 2020-06-23 DIAGNOSIS — A419 Sepsis, unspecified organism: Secondary | ICD-10-CM | POA: Diagnosis not present

## 2020-06-23 DIAGNOSIS — R0902 Hypoxemia: Secondary | ICD-10-CM | POA: Diagnosis present

## 2020-06-23 DIAGNOSIS — J069 Acute upper respiratory infection, unspecified: Secondary | ICD-10-CM

## 2020-06-23 DIAGNOSIS — E119 Type 2 diabetes mellitus without complications: Secondary | ICD-10-CM

## 2020-06-23 DIAGNOSIS — I2510 Atherosclerotic heart disease of native coronary artery without angina pectoris: Secondary | ICD-10-CM | POA: Diagnosis not present

## 2020-06-23 DIAGNOSIS — Z20822 Contact with and (suspected) exposure to covid-19: Secondary | ICD-10-CM | POA: Diagnosis present

## 2020-06-23 DIAGNOSIS — Z944 Liver transplant status: Secondary | ICD-10-CM | POA: Diagnosis not present

## 2020-06-23 DIAGNOSIS — R5383 Other fatigue: Secondary | ICD-10-CM

## 2020-06-23 DIAGNOSIS — R0602 Shortness of breath: Secondary | ICD-10-CM

## 2020-06-23 DIAGNOSIS — E11649 Type 2 diabetes mellitus with hypoglycemia without coma: Secondary | ICD-10-CM | POA: Diagnosis not present

## 2020-06-23 DIAGNOSIS — R17 Unspecified jaundice: Secondary | ICD-10-CM | POA: Diagnosis present

## 2020-06-23 DIAGNOSIS — R531 Weakness: Secondary | ICD-10-CM

## 2020-06-23 DIAGNOSIS — Z7982 Long term (current) use of aspirin: Secondary | ICD-10-CM | POA: Diagnosis not present

## 2020-06-23 LAB — CBC WITH DIFFERENTIAL/PLATELET
Abs Immature Granulocytes: 0.02 10*3/uL (ref 0.00–0.07)
Basophils Absolute: 0 10*3/uL (ref 0.0–0.1)
Basophils Relative: 0 %
Eosinophils Absolute: 0 10*3/uL (ref 0.0–0.5)
Eosinophils Relative: 1 %
HCT: 41.7 % (ref 39.0–52.0)
Hemoglobin: 13.5 g/dL (ref 13.0–17.0)
Immature Granulocytes: 0 %
Lymphocytes Relative: 11 %
Lymphs Abs: 0.6 10*3/uL — ABNORMAL LOW (ref 0.7–4.0)
MCH: 30.7 pg (ref 26.0–34.0)
MCHC: 32.4 g/dL (ref 30.0–36.0)
MCV: 94.8 fL (ref 80.0–100.0)
Monocytes Absolute: 0.7 10*3/uL (ref 0.1–1.0)
Monocytes Relative: 12 %
Neutro Abs: 4.3 10*3/uL (ref 1.7–7.7)
Neutrophils Relative %: 76 %
Platelets: 148 10*3/uL — ABNORMAL LOW (ref 150–400)
RBC: 4.4 MIL/uL (ref 4.22–5.81)
RDW: 14 % (ref 11.5–15.5)
WBC Morphology: INCREASED
WBC: 5.8 10*3/uL (ref 4.0–10.5)
nRBC: 0 % (ref 0.0–0.2)

## 2020-06-23 LAB — URINALYSIS, ROUTINE W REFLEX MICROSCOPIC
Bilirubin Urine: NEGATIVE
Glucose, UA: 150 mg/dL — AB
Hgb urine dipstick: NEGATIVE
Ketones, ur: NEGATIVE mg/dL
Leukocytes,Ua: NEGATIVE
Nitrite: NEGATIVE
Protein, ur: 30 mg/dL — AB
Specific Gravity, Urine: 1.024 (ref 1.005–1.030)
pH: 5 (ref 5.0–8.0)

## 2020-06-23 LAB — COMPREHENSIVE METABOLIC PANEL
ALT: 12 U/L (ref 0–44)
AST: 23 U/L (ref 15–41)
Albumin: 3.6 g/dL (ref 3.5–5.0)
Alkaline Phosphatase: 55 U/L (ref 38–126)
Anion gap: 13 (ref 5–15)
BUN: 49 mg/dL — ABNORMAL HIGH (ref 8–23)
CO2: 24 mmol/L (ref 22–32)
Calcium: 9.2 mg/dL (ref 8.9–10.3)
Chloride: 97 mmol/L — ABNORMAL LOW (ref 98–111)
Creatinine, Ser: 2.57 mg/dL — ABNORMAL HIGH (ref 0.61–1.24)
GFR, Estimated: 25 mL/min — ABNORMAL LOW (ref >60)
Glucose, Bld: 128 mg/dL — ABNORMAL HIGH (ref 70–99)
Potassium: 4.4 mmol/L (ref 3.5–5.1)
Sodium: 134 mmol/L — ABNORMAL LOW (ref 135–145)
Total Bilirubin: 3.1 mg/dL — ABNORMAL HIGH (ref 0.3–1.2)
Total Protein: 7.8 g/dL (ref 6.5–8.1)

## 2020-06-23 LAB — RESP PANEL BY RT-PCR (FLU A&B, COVID) ARPGX2
Influenza A by PCR: NEGATIVE
Influenza B by PCR: NEGATIVE
SARS Coronavirus 2 by RT PCR: NEGATIVE

## 2020-06-23 LAB — LACTIC ACID, PLASMA
Lactic Acid, Venous: 1.2 mmol/L (ref 0.5–1.9)
Lactic Acid, Venous: 0.9 mmol/L (ref 0.5–1.9)

## 2020-06-23 LAB — GLUCOSE, CAPILLARY: Glucose-Capillary: 99 mg/dL (ref 70–99)

## 2020-06-23 LAB — PROTIME-INR
INR: 1.4 — ABNORMAL HIGH (ref 0.8–1.2)
Prothrombin Time: 16.9 seconds — ABNORMAL HIGH (ref 11.4–15.2)

## 2020-06-23 LAB — APTT: aPTT: 43 seconds — ABNORMAL HIGH (ref 24–36)

## 2020-06-23 MED ORDER — LACTATED RINGERS IV SOLN: INTRAVENOUS | Status: DC

## 2020-06-23 MED ORDER — INSULIN GLARGINE 100 UNIT/ML ~~LOC~~ SOLN
10.0000 [IU] | Freq: Every day | SUBCUTANEOUS | Status: DC
  Filled 2020-06-23 (×2): qty 0.1

## 2020-06-23 MED ORDER — SODIUM CHLORIDE 0.9 % IV SOLN
500.0000 mg | INTRAVENOUS | Status: DC
  Administered 2020-06-23: 500 mg via INTRAVENOUS
  Filled 2020-06-23: qty 500

## 2020-06-23 MED ORDER — INSULIN ASPART 100 UNIT/ML ~~LOC~~ SOLN
0.0000 [IU] | Freq: Three times a day (TID) | SUBCUTANEOUS | Status: DC
  Administered 2020-06-24 – 2020-06-25 (×2): 2 [IU] via SUBCUTANEOUS

## 2020-06-23 MED ORDER — INSULIN ASPART 100 UNIT/ML ~~LOC~~ SOLN: 0.0000 [IU] | Freq: Every day | SUBCUTANEOUS | Status: DC

## 2020-06-23 MED ORDER — ATORVASTATIN CALCIUM 20 MG PO TABS
20.0000 mg | ORAL_TABLET | Freq: Every day | ORAL | Status: DC
  Administered 2020-06-23 – 2020-06-24 (×2): 20 mg via ORAL
  Filled 2020-06-23 (×2): qty 1

## 2020-06-23 MED ORDER — MYCOPHENOLATE MOFETIL 250 MG PO CAPS
250.0000 mg | ORAL_CAPSULE | Freq: Two times a day (BID) | ORAL | Status: DC
  Administered 2020-06-23 – 2020-06-25 (×4): 250 mg via ORAL
  Filled 2020-06-23 (×4): qty 1

## 2020-06-23 MED ORDER — SODIUM CHLORIDE 0.9 % IV SOLN
2.0000 g | INTRAVENOUS | Status: DC
  Administered 2020-06-23: 2 g via INTRAVENOUS
  Filled 2020-06-23: qty 20

## 2020-06-23 MED ORDER — ONDANSETRON HCL 4 MG/2ML IJ SOLN: 4.0000 mg | Freq: Four times a day (QID) | INTRAMUSCULAR | Status: DC | PRN

## 2020-06-23 MED ORDER — OXYCODONE HCL 5 MG PO TABS: 5.0000 mg | ORAL_TABLET | ORAL | Status: DC | PRN

## 2020-06-23 MED ORDER — ASPIRIN 81 MG PO CHEW
81.0000 mg | CHEWABLE_TABLET | Freq: Every day | ORAL | Status: DC
  Administered 2020-06-24 – 2020-06-25 (×2): 81 mg via ORAL
  Filled 2020-06-23 (×2): qty 1

## 2020-06-23 MED ORDER — ACETAMINOPHEN 325 MG PO TABS
650.0000 mg | ORAL_TABLET | Freq: Four times a day (QID) | ORAL | Status: DC | PRN
  Filled 2020-06-23: qty 2

## 2020-06-23 MED ORDER — CARVEDILOL 3.125 MG PO TABS
6.2500 mg | ORAL_TABLET | Freq: Every day | ORAL | Status: DC
  Administered 2020-06-23 – 2020-06-24 (×2): 6.25 mg via ORAL
  Filled 2020-06-23 (×2): qty 2

## 2020-06-23 MED ORDER — HEPARIN SODIUM (PORCINE) 5000 UNIT/ML IJ SOLN
5000.0000 [IU] | Freq: Three times a day (TID) | INTRAMUSCULAR | Status: DC
  Administered 2020-06-23 – 2020-06-25 (×5): 5000 [IU] via SUBCUTANEOUS
  Filled 2020-06-23 (×5): qty 1

## 2020-06-23 MED ORDER — SODIUM CHLORIDE 0.9 % IV BOLUS
1000.0000 mL | Freq: Once | INTRAVENOUS | Status: AC
  Administered 2020-06-23: 1000 mL via INTRAVENOUS

## 2020-06-23 MED ORDER — CYCLOSPORINE MODIFIED (NEORAL) 100 MG PO CAPS
200.0000 mg | ORAL_CAPSULE | Freq: Every day | ORAL | Status: DC
  Administered 2020-06-23 – 2020-06-24 (×2): 200 mg via ORAL
  Filled 2020-06-23 (×4): qty 2

## 2020-06-23 MED ORDER — LEVOTHYROXINE SODIUM 137 MCG PO TABS
137.0000 ug | ORAL_TABLET | Freq: Every day | ORAL | Status: DC
  Administered 2020-06-24: 137 ug via ORAL
  Filled 2020-06-23: qty 1

## 2020-06-23 MED ORDER — SODIUM CHLORIDE 0.9 % IV SOLN
500.0000 mg | Freq: Every day | INTRAVENOUS | Status: DC
  Administered 2020-06-24 – 2020-06-25 (×2): 500 mg via INTRAVENOUS
  Filled 2020-06-23 (×2): qty 500

## 2020-06-23 MED ORDER — ONDANSETRON HCL 4 MG PO TABS: 4.0000 mg | ORAL_TABLET | Freq: Four times a day (QID) | ORAL | Status: DC | PRN

## 2020-06-23 MED ORDER — ACETAMINOPHEN 650 MG RE SUPP: 650.0000 mg | Freq: Four times a day (QID) | RECTAL | Status: DC | PRN

## 2020-06-23 MED ORDER — SODIUM CHLORIDE 0.9 % IV SOLN
1.0000 g | Freq: Every day | INTRAVENOUS | Status: DC
  Administered 2020-06-24: 1 g via INTRAVENOUS
  Filled 2020-06-23: qty 10

## 2020-06-23 MED ORDER — CYCLOSPORINE MODIFIED (NEORAL) 100 MG PO CAPS
300.0000 mg | ORAL_CAPSULE | Freq: Every day | ORAL | Status: DC
  Administered 2020-06-25: 300 mg via ORAL
  Filled 2020-06-23 (×3): qty 3

## 2020-06-23 NOTE — H&P (Signed)
TRH H&P    Patient Demographics:    [name], is a 75 y.o. male  MRN: [id number]  DOB - 12-19-1945  Admit Date - 06/23/2020  Referring MD/NP/PA: Sabra Heck  Outpatient Primary MD for the patient is Celene Squibb, MD  Patient coming from: Home Chief complaint- No appetite   HPI:    [name]  is a 75 y.o. male, with history of hypothyroidism, HTN, HLD, GERD, DMII, liver transplant, and more presents to the ED with a chief complaint of no appetite. Patient reports that it started 10 days ago. He reports that he hasn't wanted to eat or drink at all. He had eye surgery approx 1 month ago. He reports intermittent fevers 100 - 101. He reports that he just can't make himself eat or drink. He reports non productive cough as well. He feels congested, but has not been able to cough any of it up. He feels like it is loosening up today. Patient reports that since his cough started he feels abdominal pain, rib pain, and back pain that is worse with cough and better with rest. Patient denies dysphagia, abdominal pain, nausea, vomiting, dysuria, chest pain, palpitations, or diarrhea.   Patient does not smoke, does not drink, does not use illicit drugs.  He is vaccinated for COVID.  He is full code.  In the ED Temperature is 98.2, heart rate 68-79, respiratory rate 17-32, blood pressure 123/59, satting 92% on room air White blood cell count 5.8, hemoglobin 13.5 Chemistry panel shows slight hyponatremia of 134, hypochloremia 97, elevated BUN of 49, elevated creatinine at 2.57, slightly elevated glucose 128 Lactic acid is 1.2 Blood cultures drawn Chest x-ray shows right basilar infiltrate with small effusion,  Urine culture pending UA borderline Patient was wheezing initially in the ER   Review of systems:    In addition to the HPI above,  Admits to fever, No Headache, No changes with Vision or hearing, No problems  swallowing food or Liquids, No Chest pain, admits to cough No Abdominal pain, No Nausea or Vomiting, bowel movements are regular, No Blood in stool or Urine, No dysuria, No new skin rashes or bruises, No new joints pains-aches,  No new weakness, tingling, numbness in any extremity, No recent weight gain or loss, No polyuria, polydypsia or polyphagia, No significant Mental Stressors.  All other systems reviewed and are negative.    Past History of the following :    Past Medical History:  Diagnosis Date  . Anemia    thrombocytopenia  . Atherosclerosis   . BPH (benign prostatic hyperplasia)   . Cardiomegaly   . Cirrhosis of liver ([place])    PT HAS HAD A LIVER TRANSPLANT-NASH, vaccinated for HepA/B on 05/31/11 afp 1.6, thrombus in splenic vein per ct  . Coronary artery disease 2004   s/p CAGB  . DM (diabetes mellitus) ([place])   . Erectile dysfunction   . Esophageal varices ([facility])     EGD 05/13/10, three columns grade II to III EV s/p banding, second banding since 01/2010.>Portal gastropathy.. >5 banding  sessions in the past.  . GERD (gastroesophageal reflux disease)   . Gynecomastia, male    RIGHT  . History of transfusion of whole blood   . Hyperlipidemia   . Hypertension   . Hypothyroidism   . Overactive bladder   . Spleen enlarged   . Thrombocytopenia ([place])   . Thrombus    Chronic splenic, portal and smv      Past Surgical History:  Procedure Laterality Date  . CARDIAC CATHETERIZATION  10/09/06  . CATARACT EXTRACTION W/PHACO Left 04/20/2015   Procedure: CATARACT EXTRACTION PHACO AND INTRAOCULAR LENS PLACEMENT LEFT EYE CDE=14.92;  Surgeon: Tonny Branch, MD;  Location: [facility];  Service: Ophthalmology;  Laterality: Left;  . COLONOSCOPY  01/2010   portal colopathy, sigmoid AVM (nonbleeding)  . COLONOSCOPY WITH PROPOFOL N/A 12/21/2017   Dr. Gala Romney: sigmoid diverticulosis, otherwise normal  . CORONARY ARTERY BYPASS GRAFT  2004   3 vessels  . ESOPHAGOGASTRODUODENOSCOPY  01/2010    4 COLUMNS 2-3 GRADE ESOPHAGEAL VARICES,S/P BANDING,PORTAL GASTROPATHY  . ESOPHAGOGASTRODUODENOSCOPY  June 2012   Dr. Rourk-->3 columns of grade 1 esophageal varices with overlying scar, no banding treatment required, portal gastropathy. Next EGD in December 2013  . ESOPHAGOGASTRODUODENOSCOPY N/A 04/16/2012   Dr. Gala Romney: 3 columns Grade 2-3 esophageal varices, portal gastropathy, gastric antral vascular ectasia  . LIVER TRANSPLANT  01/2013  . ROTATOR CUFF REPAIR Left       Social History:      Social History   Tobacco Use  . Smoking status: Former Smoker    Packs/day: 2.00    Years: 20.00    Pack years: 40.00    Types: Cigarettes    Quit date: 04/14/1974    Years since quitting: 46.2  . Smokeless tobacco: Never Used  . Tobacco comment: Quit smoking at age 33  Substance Use Topics  . Alcohol use: No       Family History :     Family History  Problem Relation Age of Onset  . Stroke Father 3  . Heart disease Mother 75       CHF  . Heart attack Brother   . Liver disease Neg Hx   . Colon cancer Neg Hx       Home Medications:   Prior to Admission medications   Medication Sig Start Date End Date Taking? Authorizing Provider  aspirin 81 MG tablet Take 81 mg by mouth daily.    [provider]  atorvastatin (LIPITOR) 20 MG tablet Take 20 mg by mouth at bedtime.     [provider]  Calcium Carbonate-Vitamin D (CALCIUM 600+D PO) Take 1 tablet by mouth daily.    [provider]  carvedilol (COREG) 6.25 MG tablet Take 6.25 mg by mouth at bedtime.     [provider]  cycloSPORINE modified (GENGRAF) 100 MG capsule Take 200-300 mg by mouth See admin instructions. Take 300mg  by mouth in the morning and 200 mg in the evening    [provider]  ENTRESTO 24-26 MG Take 1 tablet by mouth 2 (two) times daily. 09/21/19   [provider]  erythromycin ophthalmic ointment Place a 1/2 inch ribbon of ointment into/onto the lower  eyelid. Patient not taking: Reported on 10/15/2019 06/26/19   Wurst, [place], PA-C  hydrochlorothiazide (HYDRODIURIL) 25 MG tablet Take 25 mg by mouth at bedtime.     [provider]  insulin glargine (LANTUS) 100 UNIT/ML injection Inject 14 Units into the skin daily.     [provider]  levothyroxine (SYNTHROID, LEVOTHROID) 125 MCG tablet Take 137 mcg by mouth daily before breakfast.     [provider]  Magnesium Oxide (MAGOX 400 PO) Take 1,200 mg by mouth daily.     [provider]  mycophenolate (CELLCEPT) 250 MG capsule Take 250 mg by mouth 2 (two) times daily.     [provider]     Allergies:    No Known Allergies   Physical Exam:   Vitals  Blood pressure 124/63, pulse 73, temperature 98.6 F (37 C), temperature source Oral, resp. rate (!) 22, SpO2 94 %.  1.  General: Patient sitting on edge of bed, no acute distress  2. Psychiatric: Mood and behavior normal for situation, alert and oriented x3, pleasant and cooperative with exam  3. Neurologic: Cranial nerves II through XII are intact, speech and language normal, moves all 4 extremities voluntarily, no acute deficit on limited exam  4. HEENMT:  -Atraumatic, normocephalic, pupils reactive to light, neck is cachectic, mucous membranes are moist, trachea is midline  5. Respiratory : Minimal wheezing more on the right than the left, no rhonchi, no rales, no accessory muscle use or increased work of breathing  6. Cardiovascular : Heart rate is normal, rhythm is regular, no murmurs rubs or gallops, no peripheral edema  7. Gastrointestinal:  Abdomen is soft, nondistended, nontender to palpation, no masses palpated, scarring from liver transplant, bowel sounds active  8. Skin:  Skin is warm dry and intact minimal jaundice present  9.Musculoskeletal:  No peripheral edema, varicose veins present, no acute deformity, no calf tenderness    Data Review:    CBC Recent Labs   Lab 06/23/20 1700  WBC 5.8  HGB 13.5  HCT 41.7  PLT 148*  MCV 94.8  MCH 30.7  MCHC 32.4  RDW 14.0  LYMPHSABS 0.6*  MONOABS 0.7  EOSABS 0.0  BASOSABS 0.0   ------------------------------------------------------------------------------------------------------------------  Results for orders placed or performed during the hospital encounter of 06/23/20 (from the past 48 hour(s))  Resp Panel by RT-PCR (Flu A&B, Covid) Nasopharyngeal Swab     Status: None   Collection Time: 06/23/20  4:17 PM   Specimen: Nasopharyngeal Swab; Nasopharyngeal(NP) swabs in vial transport medium  Result Value Ref Range   SARS Coronavirus 2 by RT PCR NEGATIVE NEGATIVE    Comment: (NOTE) SARS-CoV-2 target nucleic acids are NOT DETECTED.  The SARS-CoV-2 RNA is generally detectable in upper respiratory specimens during the acute phase of infection. The lowest concentration of SARS-CoV-2 viral copies this assay can detect is 138 copies/mL. A negative result does not preclude SARS-Cov-2 infection and should not be used as the sole basis for treatment or other patient management decisions. A negative result may occur with  improper specimen collection/handling, submission of specimen other than nasopharyngeal swab, presence of viral mutation(s) within the areas targeted by this assay, and inadequate number of viral copies(<138 copies/mL). A negative result must be combined with clinical observations, patient history, and epidemiological information. The expected result is Negative.  Fact Sheet for Patients:  EntrepreneurPulse.com.au  Fact Sheet for Healthcare Providers:  IncredibleEmployment.be  This test is no t yet approved or cleared by the [place] FDA and  has been authorized for detection and/or diagnosis of SARS-CoV-2 by FDA under an Emergency Use Authorization (EUA). This EUA will remain  in effect (meaning this test can be used) for the duration of  the COVID-19 declaration under Section 564(b)(1) of the Act, 21 U.S.C.section 360bbb-3(b)(1), unless the authorization is  terminated  or revoked sooner.       Influenza A by PCR NEGATIVE NEGATIVE   Influenza B by PCR NEGATIVE NEGATIVE    Comment: (NOTE) The Xpert Xpress SARS-CoV-2/FLU/RSV plus assay is intended as an aid in the diagnosis of influenza from Nasopharyngeal swab specimens and should not be used as a sole basis for treatment. Nasal washings and aspirates are unacceptable for Xpert Xpress SARS-CoV-2/FLU/RSV testing.  Fact Sheet for Patients: EntrepreneurPulse.com.au  Fact Sheet for Healthcare Providers: IncredibleEmployment.be  This test is not yet approved or cleared by the [place] FDA and has been authorized for detection and/or diagnosis of SARS-CoV-2 by FDA under an Emergency Use Authorization (EUA). This EUA will remain in effect (meaning this test can be used) for the duration of the COVID-19 declaration under Section 564(b)(1) of the Act, 21 U.S.C. section 360bbb-3(b)(1), unless the authorization is terminated or revoked.  Performed at [facility], [address], [place], [place] [address]   Lactic acid, plasma     Status: None   Collection Time: 06/23/20  5:00 PM  Result Value Ref Range   Lactic Acid, Venous 1.2 0.5 - 1.9 mmol/L    Comment: Performed at [facility], [address] [place], [place] [address]  Comprehensive metabolic panel     Status: Abnormal   Collection Time: 06/23/20  5:00 PM  Result Value Ref Range   Sodium 134 (L) 135 - 145 mmol/L   Potassium 4.4 3.5 - 5.1 mmol/L   Chloride 97 (L) 98 - 111 mmol/L   CO2 24 22 - 32 mmol/L   Glucose, Bld 128 (H) 70 - 99 mg/dL    Comment: Glucose reference range applies only to samples taken after fasting for at least 8 hours.   BUN 49 (H) 8 - 23 mg/dL   Creatinine, Ser 2.57 (H) 0.61 - 1.24 mg/dL   Calcium 9.2 8.9 - 10.3 mg/dL   Total Protein 7.8 6.5 - 8.1  g/dL   Albumin 3.6 3.5 - 5.0 g/dL   AST 23 15 - 41 U/L   ALT 12 0 - 44 U/L   Alkaline Phosphatase 55 38 - 126 U/L   Total Bilirubin 3.1 (H) 0.3 - 1.2 mg/dL   GFR, Estimated 25 (L) >60 mL/min    Comment: (NOTE) Calculated using the CKD-EPI Creatinine Equation (2021)    Anion gap 13 5 - 15    Comment: Performed at [facility], [address], [place],  [address]  CBC WITH DIFFERENTIAL     Status: Abnormal   Collection Time: 06/23/20  5:00 PM  Result Value Ref Range   WBC 5.8 4.0 - 10.5 K/uL   RBC 4.40 4.22 - 5.81 MIL/uL   Hemoglobin 13.5 13.0 - 17.0 g/dL   HCT 41.7 39.0 - 52.0 %   MCV 94.8 80.0 - 100.0 fL   MCH 30.7 26.0 - 34.0 pg   MCHC 32.4 30.0 - 36.0 g/dL   RDW 14.0 11.5 - 15.5 %   Platelets 148 (L) 150 - 400 K/uL   nRBC 0.0 0.0 - 0.2 %   Neutrophils Relative % 76 %   Neutro Abs 4.3 1.7 - 7.7 K/uL   Lymphocytes Relative 11 %   Lymphs Abs 0.6 (L) 0.7 - 4.0 K/uL   Monocytes Relative 12 %   Monocytes Absolute 0.7 0.1 - 1.0 K/uL   Eosinophils Relative 1 %   Eosinophils Absolute 0.0 0.0 - 0.5 K/uL   Basophils Relative 0 %   Basophils Absolute  0.0 0.0 - 0.1 K/uL   WBC Morphology INCREASED BANDS (>20% BANDS)    Immature Granulocytes 0 %   Abs Immature Granulocytes 0.02 0.00 - 0.07 K/uL    Comment: Performed at [facility], [address], [place], [place] [address]  Protime-INR     Status: Abnormal   Collection Time: 06/23/20  5:00 PM  Result Value Ref Range   Prothrombin Time 16.9 (H) 11.4 - 15.2 seconds   INR 1.4 (H) 0.8 - 1.2    Comment: (NOTE) INR goal varies based on device and disease states. Performed at [facility] [address] [place], [place] [address]   APTT     Status: Abnormal   Collection Time: 06/23/20  5:00 PM  Result Value Ref Range   aPTT 43 (H) 24 - 36 seconds    Comment:        IF BASELINE aPTT IS ELEVATED, SUGGEST PATIENT RISK ASSESSMENT BE USED TO DETERMINE APPROPRIATE ANTICOAGULANT THERAPY. Performed at [facility], [address], [place], [place] [address]   Blood Culture (routine x 2)     Status: None (Preliminary result)   Collection Time: 06/23/20  5:00 PM   Specimen: Blood  Result Value Ref Range   Specimen Description BLOOD LEFT ANTECUBITAL    Special Requests      BOTTLES DRAWN AEROBIC AND ANAEROBIC Blood Culture adequate volume Performed at [facility], [address] [place], [place] [address]    Culture PENDING    Report Status PENDING   Blood Culture (routine x 2)     Status: None (Preliminary result)   Collection Time: 06/23/20  5:00 PM   Specimen: Blood  Result Value Ref Range   Specimen Description BLOOD RIGHT FOREARM    Special Requests      BOTTLES DRAWN AEROBIC AND ANAEROBIC Blood Culture adequate volume Performed at [facility], [address] [place], [place] [address]    Culture PENDING    Report Status PENDING   Urinalysis, Routine w reflex microscopic Urine, Random     Status: Abnormal   Collection Time: 06/23/20  6:15 PM  Result Value Ref Range   Color, Urine YELLOW YELLOW   APPearance HAZY (A) CLEAR   Specific Gravity, Urine 1.024 1.005 - 1.030   pH 5.0 5.0 - 8.0   Glucose, UA 150 (A) NEGATIVE mg/dL   Hgb urine dipstick NEGATIVE NEGATIVE   Bilirubin Urine NEGATIVE NEGATIVE   Ketones, ur NEGATIVE NEGATIVE mg/dL   Protein, ur 30 (A) NEGATIVE mg/dL   Nitrite NEGATIVE NEGATIVE   Leukocytes,Ua NEGATIVE NEGATIVE   RBC / HPF 0-5 0 - 5 RBC/hpf   WBC, UA 6-10 0 - 5 WBC/hpf   Bacteria, UA RARE (A) NONE SEEN   Mucus PRESENT    Hyaline Casts, UA PRESENT     Comment: Performed at [facility], [address], [place],  [address]    Chemistries  Recent Labs  Lab 06/23/20 1700  NA 134*  K 4.4  CL 97*  CO2 24  GLUCOSE 128*  BUN 49*  CREATININE 2.57*  CALCIUM 9.2  AST 23  ALT 12  ALKPHOS 55  BILITOT 3.1*    ------------------------------------------------------------------------------------------------------------------  ------------------------------------------------------------------------------------------------------------------ GFR: CrCl cannot be calculated (Unknown ideal weight.). Liver Function Tests: Recent Labs  Lab 06/23/20 1700  AST 23  ALT 12  ALKPHOS 55  BILITOT 3.1*  PROT 7.8  ALBUMIN 3.6   No results for input(s): LIPASE, AMYLASE in the last 168 hours. No results for input(s):  AMMONIA in the last 168 hours. Coagulation Profile: Recent Labs  Lab 06/23/20 1700  INR 1.4*   Cardiac Enzymes: No results for input(s): CKTOTAL, CKMB, CKMBINDEX, TROPONINI in the last 168 hours. BNP (last 3 results) No results for input(s): PROBNP in the last 8760 hours. HbA1C: No results for input(s): HGBA1C in the last 72 hours. CBG: No results for input(s): GLUCAP in the last 168 hours. Lipid Profile: No results for input(s): CHOL, HDL, LDLCALC, TRIG, CHOLHDL, LDLDIRECT in the last 72 hours. Thyroid Function Tests: No results for input(s): TSH, T4TOTAL, FREET4, T3FREE, THYROIDAB in the last 72 hours. Anemia Panel: No results for input(s): VITAMINB12, FOLATE, FERRITIN, TIBC, IRON, RETICCTPCT in the last 72 hours.  --------------------------------------------------------------------------------------------------------------- Urine analysis:    Component Value Date/Time   COLORURINE YELLOW 06/23/2020 1815   APPEARANCEUR HAZY (A) 06/23/2020 1815   LABSPEC 1.024 06/23/2020 1815   PHURINE 5.0 06/23/2020 1815   GLUCOSEU 150 (A) 06/23/2020 1815   HGBUR NEGATIVE 06/23/2020 1815   BILIRUBINUR NEGATIVE 06/23/2020 1815   KETONESUR NEGATIVE 06/23/2020 1815   PROTEINUR 30 (A) 06/23/2020 1815   UROBILINOGEN 0.2 04/06/2012 1206   NITRITE NEGATIVE 06/23/2020 1815   LEUKOCYTESUR NEGATIVE 06/23/2020 1815      Imaging Results:    DG Chest 2 View  Result Date: 06/23/2020 CLINICAL  DATA:  Cough, shortness of breath, fatigue EXAM: CHEST - 2 VIEW COMPARISON:  04/06/2014 FINDINGS: Post CABG changes. Heart size within normal limits. Atherosclerotic calcification of the aortic knob. Diffuse bilateral interstitial prominence. Hazy opacity within the right mid lung. No pleural effusion. No pneumothorax. IMPRESSION: Diffuse bilateral interstitial prominence with hazy right midlung opacity, which may reflect edema or atypical/viral infection. Electronically Signed   By: Davina Poke D.O.   On: 06/23/2020 14:24   DG Chest Port 1 View  Result Date: 06/23/2020 CLINICAL DATA:  Sepsis. EXAM: PORTABLE CHEST 1 VIEW COMPARISON:  Earlier film, same date. FINDINGS: The cardiac silhouette, mediastinal and hilar contours are within normal limits and stable, given the AP projection and portable technique. Stable surgical changes from bypass surgery. Persistent patchy E right basilar infiltrate and suspected small right pleural effusion. No pneumothorax. IMPRESSION: Persistent right basilar infiltrate and small right effusion. Electronically Signed   By: Marijo Sanes M.D.   On: 06/23/2020 17:29       Assessment & Plan:    Principal Problem:   AKI (acute kidney injury) ([place]) Active Problems:   Jaundice   Type 2 diabetes mellitus ([place])   Community acquired pneumonia   1. AKI 1. Prerenal secondary to poor p.o. intake 2. 1 L bolus given in ED 3. Continue IV hydration 4. Hold hydrochlorothiazide and ARB 5. Creatinine baseline 1.5, today 2.5 6. Continue to monitor 2. CAP 1. X-ray finding on chest x-ray, fever reported from home 2. Immunocompromise with antirejection medications 3. Continue Rocephin and Zithromax 4. Continue to monitor 3. Jaundice 1. Continue fluids 2. Trend in a.m. 3. Bili 3.1 4. In the setting of liver transplant and volume down status 4. DMII 1. Continue partial dose of long-acting insulin 2. Sliding scale coverage 3. Carb modified diet 5. HTN 1. Holding  home meds secondary to AKI 2. Blood pressure well controlled now 123/59 3. Add as needed medication if indicated 6. Hx of liver transplant 1. Continue antirejection medications 7.    DVT Prophylaxis-   Heparin- SCDs  AM Labs Ordered, also please review Full Orders  Family Communication: No family at bedside  Code Status:  Full  Admission  status: Inpatient :The appropriate admission status for this patient is INPATIENT. Inpatient status is judged to be reasonable and necessary in order to provide the required intensity of service to ensure the patient's safety. The patient's presenting symptoms, physical exam findings, and initial radiographic and laboratory data in the context of their chronic comorbidities is felt to place them at high risk for further clinical deterioration. Furthermore, it is not anticipated that the patient will be medically stable for discharge from the hospital within 2 midnights of admission. The following factors support the admission status of inpatient.     The patient's presenting symptoms include fever cough and no appetite The worrisome physical exam findings include jaundice The initial radiographic and laboratory data are worrisome because of pneumonia on chest x-ray, AKI The chronic co-morbidities include liver transplant, hypertension, diabetes mellitus type 2       * I certify that at the point of admission it is my clinical judgment that the patient will require inpatient hospital care spanning beyond 2 midnights from the point of admission due to high intensity of service, high risk for further deterioration and high frequency of surveillance required.*  Time spent in minutes : [place]

## 2020-06-23 NOTE — ED Triage Notes (Signed)
Triaged by provider  

## 2020-06-23 NOTE — ED Notes (Signed)
Patient is being discharged from the Urgent Care and sent to the Emergency Department via pov . Per Faustino Congress , NP  patient is in need of higher level of care due to hypoxia . Patient is aware and verbalizes understanding of plan of care.  Vitals:   06/23/20 1337  BP: 102/80  Pulse: 68  Resp: 20  Temp: 98.3 F (36.8 C)  SpO2: 91%

## 2020-06-23 NOTE — ED Notes (Signed)
.  kuc

## 2020-06-23 NOTE — Discharge Instructions (Signed)
Go to the ER for further evaluation and treatment  I'm concerned for sepsis with your lower blood pressure and being sick over the last 5 days  I'm concerned about your oxygen level as well and you will need supplemental oxygen until you can maintain your levels on your own

## 2020-06-23 NOTE — ED Triage Notes (Signed)
Hurting all over for 5 days

## 2020-06-23 NOTE — ED Provider Notes (Signed)
[facility] EMERGENCY DEPARTMENT Provider Note   CSN: [id number] Arrival date & time: 06/23/20  1500     History No chief complaint on file.   [name] is a 75 y.o. male.  HPI   This patient is a 75 year old male, he has a history of cirrhosis, has a history of liver transplant status post multiple antirejection drugs constantly for the last 8 years, history of esophageal varices history of hypertension hyperlipidemia.  He also has a history of thrombocytopenia.  He presents from the urgent care after being seen for coughing and shortness of breath, he was found to have multifocal infiltrates, mild hypoxia and was referred to the emergency department.  States he has been coughing for approximately 1 week, denies any swelling of the legs, denies chest pain or back pain.  Symptoms are persistent.  His maximum temperature is 101.0.  No nausea vomiting or diarrhea.  He has lost his appetite.  His blood sugar at home has been around 100.   Past Medical History:  Diagnosis Date  . Anemia    thrombocytopenia  . Atherosclerosis   . BPH (benign prostatic hyperplasia)   . Cardiomegaly   . Cirrhosis of liver ([place])    PT HAS HAD A LIVER TRANSPLANT-NASH, vaccinated for HepA/B on 05/31/11 afp 1.6, thrombus in splenic vein per ct  . Coronary artery disease 2004   s/p CAGB  . DM (diabetes mellitus) ([place])   . Erectile dysfunction   . Esophageal varices (HCC)     EGD 05/13/10, three columns grade II to III EV s/p banding, second banding since 01/2010.>Portal gastropathy.. >5 banding sessions in the past.  . GERD (gastroesophageal reflux disease)   . Gynecomastia, male    RIGHT  . History of transfusion of whole blood   . Hyperlipidemia   . Hypertension   . Hypothyroidism   . Overactive bladder   . Spleen enlarged   . Thrombocytopenia ([place])   . Thrombus    Chronic splenic, portal and smv    Patient Active Problem List   Diagnosis Date Noted  . Follow-up examination 06/22/2018   . Gynecomastia 09/18/2012  . Edema 08/08/2011  . Vomiting 05/31/2011  . Diarrhea 05/31/2011  . Abdominal distention 05/31/2011  . DIABETES MELLITUS, CONTROLLED, WITHOUT COMPLICATIONS 09/98/3382  . OTITIS EXTERNA, ACUTE, LEFT 11/07/2008  . SEBORRHEIC KERATOSIS 05/08/2008  . TRIGGER FINGER, RIGHT MIDDLE 05/11/2007  . HYPERBILIRUBINEMIA 01/08/2007  . ABDOMINAL PAIN, UPPER 01/08/2007  . ACTINIC KERATOSIS 12/04/2006  . ERECTILE DYSFUNCTION 04/24/2006  . HYPOTHYROIDISM 02/01/2006  . HYPERLIPIDEMIA 02/01/2006  . ANEMIA-NOS 02/01/2006  . CORONARY ARTERY DISEASE 02/01/2006  . ESOPHAGEAL VARICES 02/01/2006  . HEPATIC FAILURE, END STAGE 02/01/2006  . CIRRHOSIS 02/01/2006  . SPLENOMEGALY 02/01/2006    Past Surgical History:  Procedure Laterality Date  . CARDIAC CATHETERIZATION  10/09/06  . CATARACT EXTRACTION W/PHACO Left 04/20/2015   Procedure: CATARACT EXTRACTION PHACO AND INTRAOCULAR LENS PLACEMENT LEFT EYE CDE=14.92;  Surgeon: Tonny Branch, MD;  Location: [facility];  Service: Ophthalmology;  Laterality: Left;  . COLONOSCOPY  01/2010   portal colopathy, sigmoid AVM (nonbleeding)  . COLONOSCOPY WITH PROPOFOL N/A 12/21/2017   Dr. Gala Romney: sigmoid diverticulosis, otherwise normal  . CORONARY ARTERY BYPASS GRAFT  2004   3 vessels  . ESOPHAGOGASTRODUODENOSCOPY  01/2010   4 COLUMNS 2-3 GRADE ESOPHAGEAL VARICES,S/P BANDING,PORTAL GASTROPATHY  . ESOPHAGOGASTRODUODENOSCOPY  June 2012   Dr. Rourk-->3 columns of grade 1 esophageal varices with overlying scar, no banding treatment required, portal gastropathy. Next  EGD in December 2013  . ESOPHAGOGASTRODUODENOSCOPY N/A 04/16/2012   Dr. Gala Romney: 3 columns Grade 2-3 esophageal varices, portal gastropathy, gastric antral vascular ectasia  . LIVER TRANSPLANT  01/2013  . ROTATOR CUFF REPAIR Left        Family History  Problem Relation Age of Onset  . Stroke Father 75  . Heart disease Mother 48       CHF  . Heart attack Brother   . Liver disease Neg  Hx   . Colon cancer Neg Hx     Social History   Tobacco Use  . Smoking status: Former Smoker    Packs/day: 2.00    Years: 20.00    Pack years: 40.00    Types: Cigarettes    Quit date: 04/14/1974    Years since quitting: 46.2  . Smokeless tobacco: Never Used  . Tobacco comment: Quit smoking at age 29  Vaping Use  . Vaping Use: Never used  Substance Use Topics  . Alcohol use: No  . Drug use: No    Home Medications Prior to Admission medications   Medication Sig Start Date End Date Taking? Authorizing Provider  aspirin 81 MG tablet Take 81 mg by mouth daily.    [provider]  atorvastatin (LIPITOR) 20 MG tablet Take 20 mg by mouth at bedtime.     [provider]  Calcium Carbonate-Vitamin D (CALCIUM 600+D PO) Take 1 tablet by mouth daily.    [provider]  carvedilol (COREG) 6.25 MG tablet Take 6.25 mg by mouth at bedtime.     [provider]  cycloSPORINE modified (GENGRAF) 100 MG capsule Take 200-300 mg by mouth See admin instructions. Take 300mg  by mouth in the morning and 200 mg in the evening    [provider]  ENTRESTO 24-26 MG Take 1 tablet by mouth 2 (two) times daily. 09/21/19   [provider]  erythromycin ophthalmic ointment Place a 1/2 inch ribbon of ointment into/onto the lower eyelid. Patient not taking: Reported on 10/15/2019 06/26/19   Wurst, [place], PA-C  hydrochlorothiazide (HYDRODIURIL) 25 MG tablet Take 25 mg by mouth at bedtime.     [provider]  insulin glargine (LANTUS) 100 UNIT/ML injection Inject 14 Units into the skin daily.     [provider]  levothyroxine (SYNTHROID, LEVOTHROID) 125 MCG tablet Take 137 mcg by mouth daily before breakfast.     [provider]  Magnesium Oxide (MAGOX 400 PO) Take 1,200 mg by mouth daily.     [provider]  mycophenolate (CELLCEPT) 250 MG capsule Take 250 mg by mouth 2 (two) times daily.     [provider]     Allergies    Patient has no known allergies.  Review of Systems   Review of Systems  All other systems reviewed and are negative.   Physical Exam Updated Vital Signs BP (!) 123/59   Pulse 77   Temp 98.2 F (36.8 C) (Oral)   Resp 20   SpO2 92%   Physical Exam Vitals and nursing note reviewed.  Constitutional:      General: He is not in acute distress.    Appearance: He is well-developed.  HENT:     Head: Normocephalic and atraumatic.     Mouth/Throat:     Pharynx: No oropharyngeal exudate.  Eyes:     General: Scleral icterus present.        Right eye: No discharge.        Left  eye: No discharge.     Conjunctiva/sclera: Conjunctivae normal.     Pupils: Pupils are equal, round, and reactive to light.  Neck:     Thyroid: No thyromegaly.     Vascular: No JVD.  Cardiovascular:     Rate and Rhythm: Normal rate and regular rhythm.     Heart sounds: Normal heart sounds. No murmur heard. No friction rub. No gallop.   Pulmonary:     Effort: Pulmonary effort is normal. No respiratory distress.     Breath sounds: Normal breath sounds. No wheezing or rales.     Comments: Lungs are clear, no tachypnea, no wheezing or rales, no respiratory distress Abdominal:     General: Bowel sounds are normal. There is no distension.     Palpations: Abdomen is soft. There is no mass.     Tenderness: There is no abdominal tenderness.  Musculoskeletal:        General: No tenderness. Normal range of motion.     Cervical back: Normal range of motion and neck supple.     Right lower leg: No edema.     Left lower leg: No edema.  Lymphadenopathy:     Cervical: No cervical adenopathy.  Skin:    General: Skin is warm and dry.     Findings: No erythema or rash.  Neurological:     Mental Status: He is alert.     Coordination: Coordination normal.  Psychiatric:        Behavior: Behavior normal.     ED Results / Procedures / Treatments   Labs (all labs ordered are listed, but only  abnormal results are displayed) Labs Reviewed  COMPREHENSIVE METABOLIC PANEL - Abnormal; Notable for the following components:      Result Value   Sodium 134 (*)    Chloride 97 (*)    Glucose, Bld 128 (*)    BUN 49 (*)    Creatinine, Ser 2.57 (*)    Total Bilirubin 3.1 (*)    GFR, Estimated 25 (*)    All other components within normal limits  CBC WITH DIFFERENTIAL/PLATELET - Abnormal; Notable for the following components:   Platelets 148 (*)    Lymphs Abs 0.6 (*)    All other components within normal limits  PROTIME-INR - Abnormal; Notable for the following components:   Prothrombin Time 16.9 (*)    INR 1.4 (*)    All other components within normal limits  APTT - Abnormal; Notable for the following components:   aPTT 43 (*)    All other components within normal limits  URINALYSIS, ROUTINE W REFLEX MICROSCOPIC - Abnormal; Notable for the following components:   APPearance HAZY (*)    Glucose, UA 150 (*)    Protein, ur 30 (*)    Bacteria, UA RARE (*)    All other components within normal limits  RESP PANEL BY RT-PCR (FLU A&B, COVID) ARPGX2  CULTURE, BLOOD (ROUTINE X 2)  CULTURE, BLOOD (ROUTINE X 2)  URINE CULTURE  LACTIC ACID, PLASMA  LACTIC ACID, PLASMA    EKG None  Radiology DG Chest 2 View  Result Date: 06/23/2020 CLINICAL DATA:  Cough, shortness of breath, fatigue EXAM: CHEST - 2 VIEW COMPARISON:  04/06/2014 FINDINGS: Post CABG changes. Heart size within normal limits. Atherosclerotic calcification of the aortic knob. Diffuse bilateral interstitial prominence. Hazy opacity within the right mid lung. No pleural effusion. No pneumothorax. IMPRESSION: Diffuse bilateral interstitial prominence with hazy right midlung opacity, which may reflect edema or atypical/viral  infection. Electronically Signed   By: Davina Poke D.O.   On: 06/23/2020 14:24   DG Chest Port 1 View  Result Date: 06/23/2020 CLINICAL DATA:  Sepsis. EXAM: PORTABLE CHEST 1 VIEW COMPARISON:  Earlier  film, same date. FINDINGS: The cardiac silhouette, mediastinal and hilar contours are within normal limits and stable, given the AP projection and portable technique. Stable surgical changes from bypass surgery. Persistent patchy E right basilar infiltrate and suspected small right pleural effusion. No pneumothorax. IMPRESSION: Persistent right basilar infiltrate and small right effusion. Electronically Signed   By: Marijo Sanes M.D.   On: 06/23/2020 17:29    Procedures Procedures   Medications Ordered in ED Medications  lactated ringers infusion ( Intravenous New Bag/Given 06/23/20 1709)  cefTRIAXone (ROCEPHIN) 2 g in sodium chloride 0.9 % 100 mL IVPB (0 g Intravenous Stopped 06/23/20 1759)  azithromycin (ZITHROMAX) 500 mg in sodium chloride 0.9 % 250 mL IVPB (0 mg Intravenous Stopped 06/23/20 1812)  sodium chloride 0.9 % bolus 1,000 mL (has no administration in time range)    ED Course  I have reviewed the triage vital signs and the nursing notes.  Pertinent labs & imaging results that were available during my care of the patient were reviewed by me and considered in my medical decision making (see chart for details).    MDM Rules/Calculators/A&P                          Ultimately this patient is immunosuppressed from liver transplant, he has what appears to be an infiltrate on the x-ray that could be consistent with pneumonia and with his hypoxia will likely need to be admitted.  Antibiotics ordered, IV fluids ordered for his acute kidney injury, thankfully he is not tachycardic, febrile, hypotensive and has no leukocytosis although this may in part be related to the immunosuppression.  Will discuss with hospitalist for admission.  COVID-negative.  Discussed the care with the hospitalist who will admit  Final Clinical Impression(s) / ED Diagnoses Final diagnoses:  Community acquired pneumonia of right lung, unspecified part of lung  AKI (acute kidney injury) ([place])  Immunosuppressed  status ([place])      Noemi Chapel, MD 06/23/20 [phone]

## 2020-06-24 DIAGNOSIS — D849 Immunodeficiency, unspecified: Secondary | ICD-10-CM

## 2020-06-24 DIAGNOSIS — J189 Pneumonia, unspecified organism: Secondary | ICD-10-CM | POA: Diagnosis present

## 2020-06-24 LAB — COMPREHENSIVE METABOLIC PANEL
ALT: 10 U/L (ref 0–44)
AST: 20 U/L (ref 15–41)
Albumin: 2.7 g/dL — ABNORMAL LOW (ref 3.5–5.0)
Alkaline Phosphatase: 41 U/L (ref 38–126)
Anion gap: 11 (ref 5–15)
BUN: 53 mg/dL — ABNORMAL HIGH (ref 8–23)
CO2: 21 mmol/L — ABNORMAL LOW (ref 22–32)
Calcium: 8.4 mg/dL — ABNORMAL LOW (ref 8.9–10.3)
Chloride: 102 mmol/L (ref 98–111)
Creatinine, Ser: 2.29 mg/dL — ABNORMAL HIGH (ref 0.61–1.24)
GFR, Estimated: 29 mL/min — ABNORMAL LOW (ref >60)
Glucose, Bld: 64 mg/dL — ABNORMAL LOW (ref 70–99)
Potassium: 4.1 mmol/L (ref 3.5–5.1)
Sodium: 134 mmol/L — ABNORMAL LOW (ref 135–145)
Total Bilirubin: 1.8 mg/dL — ABNORMAL HIGH (ref 0.3–1.2)
Total Protein: 6.1 g/dL — ABNORMAL LOW (ref 6.5–8.1)

## 2020-06-24 LAB — COVID-19, FLU A+B NAA
Influenza A, NAA: NOT DETECTED
Influenza B, NAA: NOT DETECTED
SARS-CoV-2, NAA: NOT DETECTED

## 2020-06-24 LAB — CBC
HCT: 36 % — ABNORMAL LOW (ref 39.0–52.0)
Hemoglobin: 11.5 g/dL — ABNORMAL LOW (ref 13.0–17.0)
MCH: 29.7 pg (ref 26.0–34.0)
MCHC: 31.9 g/dL (ref 30.0–36.0)
MCV: 93 fL (ref 80.0–100.0)
Platelets: 144 10*3/uL — ABNORMAL LOW (ref 150–400)
RBC: 3.87 MIL/uL — ABNORMAL LOW (ref 4.22–5.81)
RDW: 13.8 % (ref 11.5–15.5)
WBC: 5.7 10*3/uL (ref 4.0–10.5)
nRBC: 0 % (ref 0.0–0.2)

## 2020-06-24 LAB — GLUCOSE, CAPILLARY
Glucose-Capillary: 54 mg/dL — ABNORMAL LOW (ref 70–99)
Glucose-Capillary: 71 mg/dL (ref 70–99)
Glucose-Capillary: 155 mg/dL — ABNORMAL HIGH (ref 70–99)
Glucose-Capillary: 143 mg/dL — ABNORMAL HIGH (ref 70–99)
Glucose-Capillary: 116 mg/dL — ABNORMAL HIGH (ref 70–99)

## 2020-06-24 LAB — MAGNESIUM: Magnesium: 2.1 mg/dL (ref 1.7–2.4)

## 2020-06-24 MED ORDER — LEVOTHYROXINE SODIUM 25 MCG PO TABS
125.0000 ug | ORAL_TABLET | Freq: Every day | ORAL | Status: DC
  Administered 2020-06-25: 125 ug via ORAL
  Filled 2020-06-24: qty 1

## 2020-06-24 MED ORDER — GUAIFENESIN-CODEINE 100-10 MG/5ML PO SOLN
5.0000 mL | Freq: Four times a day (QID) | ORAL | Status: DC | PRN
  Administered 2020-06-24 – 2020-06-25 (×4): 5 mL via ORAL
  Filled 2020-06-24 (×4): qty 5

## 2020-06-24 MED ORDER — SODIUM CHLORIDE 0.9 % IV SOLN: INTRAVENOUS | Status: DC

## 2020-06-24 MED ORDER — INSULIN GLARGINE 100 UNIT/ML ~~LOC~~ SOLN
8.0000 [IU] | Freq: Every day | SUBCUTANEOUS | Status: DC
  Filled 2020-06-24: qty 0.08

## 2020-06-24 MED ORDER — NEOMYCIN-POLYMYXIN-DEXAMETH 3.5-10000-0.1 OP OINT
TOPICAL_OINTMENT | Freq: Two times a day (BID) | OPHTHALMIC | Status: DC
  Administered 2020-06-24: 1 via OPHTHALMIC

## 2020-06-24 MED ORDER — CYCLOSPORINE MODIFIED (NEORAL) 25 MG PO CAPS
300.0000 mg | ORAL_CAPSULE | Freq: Every day | ORAL | Status: AC
  Administered 2020-06-24: 300 mg via ORAL
  Filled 2020-06-24: qty 12

## 2020-06-24 NOTE — ED Provider Notes (Signed)
[name]    CSN: [id number] Arrival date & time: 06/23/20  1228      History   Chief Complaint No chief complaint on file.   HPI [name] is a 75 y.o. male.   Reports cough, SOB, chest pain with coughing, fever, chest tightness, fatigue, weakness, decreased appetite, decreased activity for the last 5 days. Denies sick contacts. Denies known Covid or flu exposure. Has completed Covid vaccines and has had 2 boosters. Has completed flu vaccine. Has history of liver transplant with continuous use of antirejection drugs, CAD, hypothyroidism, GERD, thrombocytopenia, diabetes, esophageal varices, HTN, BPH, and cardiomegaly. Has attempted to treat with OTC cough and cold medications with no relief. Declines nausea, vomiting, diarrhea, rash, other symptoms.  ROS per HPI  The history is provided by the patient.    Past Medical History:  Diagnosis Date  . Anemia    thrombocytopenia  . Atherosclerosis   . BPH (benign prostatic hyperplasia)   . Cardiomegaly   . Cirrhosis of liver ([place])    PT HAS HAD A LIVER TRANSPLANT-NASH, vaccinated for HepA/B on 05/31/11 afp 1.6, thrombus in splenic vein per ct  . Coronary artery disease 2004   s/p CAGB  . DM (diabetes mellitus) ([place])   . Erectile dysfunction   . Esophageal varices (HCC)     EGD 05/13/10, three columns grade II to III EV s/p banding, second banding since 01/2010.>Portal gastropathy.. >5 banding sessions in the past.  . GERD (gastroesophageal reflux disease)   . Gynecomastia, male    RIGHT  . History of transfusion of whole blood   . Hyperlipidemia   . Hypertension   . Hypothyroidism   . Overactive bladder   . Spleen enlarged   . Thrombocytopenia ([place])   . Thrombus    Chronic splenic, portal and smv    Patient Active Problem List   Diagnosis Date Noted  . Community acquired pneumonia 06/24/2020  . AKI (acute kidney injury) ([place]) 06/23/2020  . Type 2 diabetes mellitus ([place]) 06/23/2020  . History of  transplantation, liver ([place]) 05/11/2020  . Chronic combined systolic and diastolic heart failure ([place]) 10/18/2019  . Nonrheumatic tricuspid valve regurgitation 05/31/2019  . Pain in finger 12/20/2017  . PVC (premature ventricular contraction) 12/20/2017  . Pain in right knee 11/17/2017  . Hypertension, essential 05/02/2016  . Secondary hyperparathyroidism of renal origin ([place]) 05/02/2016  . Acute urinary retention 07/11/2014  . Incisional hernia without obstruction or gangrene 03/27/2014  . Hyperkalemia 02/03/2014  . Chronic kidney disease (CKD), stage III (moderate) ([place]) 07/02/2013  . Hematospermia 05/27/2013  . Hematoma of rectus sheath 02/11/2013  . Portal vein thrombosis 02/04/2013  . Aftercare following organ transplant 01/28/2013  . Immunosuppression ([place]) 01/28/2013  . Hypertrophy of breast 09/18/2012  . S/P CABG x 3 06/15/2012  . Edema 08/08/2011  . Vomiting 05/31/2011  . Diarrhea 05/31/2011  . Abdominal distention 05/31/2011  . Thyroid disease 11/07/2008  . OTITIS EXTERNA, ACUTE, LEFT 11/07/2008  . SEBORRHEIC KERATOSIS 05/08/2008  . Acquired trigger finger 05/11/2007  . Jaundice 01/08/2007  . ABDOMINAL PAIN, UPPER 01/08/2007  . Actinic keratosis 12/04/2006  . ERECTILE DYSFUNCTION 04/24/2006  . Hyperlipidemia 02/01/2006  . ANEMIA-NOS 02/01/2006  . CAD (coronary artery disease) 02/01/2006  . ESOPHAGEAL VARICES 02/01/2006  . HEPATIC FAILURE, END STAGE 02/01/2006  . CIRRHOSIS 02/01/2006  . SPLENOMEGALY 02/01/2006    Past Surgical History:  Procedure Laterality Date  . CARDIAC CATHETERIZATION  10/09/06  . CATARACT EXTRACTION W/PHACO Left 04/20/2015  Procedure: CATARACT EXTRACTION PHACO AND INTRAOCULAR LENS PLACEMENT LEFT EYE CDE=14.92;  Surgeon: Tonny Branch, MD;  Location: [facility];  Service: Ophthalmology;  Laterality: Left;  . COLONOSCOPY  01/2010   portal colopathy, sigmoid AVM (nonbleeding)  . COLONOSCOPY WITH PROPOFOL N/A 12/21/2017   Dr. Gala Romney: sigmoid  diverticulosis, otherwise normal  . CORONARY ARTERY BYPASS GRAFT  2004   3 vessels  . ESOPHAGOGASTRODUODENOSCOPY  01/2010   4 COLUMNS 2-3 GRADE ESOPHAGEAL VARICES,S/P BANDING,PORTAL GASTROPATHY  . ESOPHAGOGASTRODUODENOSCOPY  June 2012   Dr. Rourk-->3 columns of grade 1 esophageal varices with overlying scar, no banding treatment required, portal gastropathy. Next EGD in December 2013  . ESOPHAGOGASTRODUODENOSCOPY N/A 04/16/2012   Dr. Gala Romney: 3 columns Grade 2-3 esophageal varices, portal gastropathy, gastric antral vascular ectasia  . LIVER TRANSPLANT  01/2013  . ROTATOR CUFF REPAIR Left        Home Medications    Prior to Admission medications   Medication Sig Start Date End Date Taking? Authorizing Provider  aspirin 81 MG tablet Take 81 mg by mouth daily.    [provider]  atorvastatin (LIPITOR) 20 MG tablet Take 20 mg by mouth at bedtime.    [provider]  Calcium Carbonate-Vitamin D (CALCIUM 600+D PO) Take 1 tablet by mouth daily.    [provider]  carvedilol (COREG) 6.25 MG tablet Take 6.25 mg by mouth at bedtime.     [provider]  cycloSPORINE modified (NEORAL) 100 MG capsule Take 200-300 mg by mouth See admin instructions. Take 300mg  by mouth in the morning and 200 mg in the evening    [provider]  dapagliflozin propanediol (FARXIGA) 10 MG TABS tablet Take 10 mg by mouth daily. 06/05/20   [provider]  ENTRESTO 24-26 MG Take 1 tablet by mouth 2 (two) times daily. Patient not taking: No sig reported 09/21/19   [provider]  erythromycin ophthalmic ointment Place a 1/2 inch ribbon of ointment into/onto the lower eyelid. Patient not taking: No sig reported 06/26/19   Wurst, [place], PA-C  hydrochlorothiazide (HYDRODIURIL) 25 MG tablet Take 25 mg by mouth at bedtime.     [provider]  insulin glargine (LANTUS) 100 UNIT/ML injection Inject 14 Units into the skin daily.     [provider]  levothyroxine (SYNTHROID, LEVOTHROID) 125 MCG tablet Take 125 mcg by mouth daily before breakfast.    [provider]  losartan (COZAAR) 25 MG tablet Take 25 mg by mouth daily. 03/16/20   [provider]  Magnesium Oxide (MAGOX 400 PO) Take 1,200 mg by mouth daily.     [provider]  mycophenolate (CELLCEPT) 250 MG capsule Take 250 mg by mouth 2 (two) times daily.     [provider]  neomycin-polymyxin-dexameth (MAXITROL) 0.1 % OINT Place 1 application into the left eye in the morning and at bedtime.    [provider]    Family History Family History  Problem Relation Age of Onset  . Stroke Father 48  . Heart disease Mother 101       CHF  . Heart attack Brother   . Liver disease Neg Hx   . Colon cancer Neg Hx     Social History Social History   Tobacco Use  . Smoking status: Former Smoker    Packs/day: 2.00    Years: 20.00    Pack years: 40.00    Types: Cigarettes    Quit date: 04/14/1974    Years since quitting:  46.2  . Smokeless tobacco: Never Used  . Tobacco comment: Quit smoking at age 64  Vaping Use  . Vaping Use: Never used  Substance Use Topics  . Alcohol use: No  . Drug use: No     Allergies   Patient has no known allergies.   Review of Systems Review of Systems   Physical Exam Triage Vital Signs ED Triage Vitals  Enc Vitals Group     BP 06/23/20 1337 102/80     Pulse Rate 06/23/20 1337 68     Resp 06/23/20 1337 20     Temp 06/23/20 1337 98.3 F (36.8 C)     Temp Source 06/23/20 1337 Oral     SpO2 06/23/20 1337 91 %     Weight --      Height --      Head Circumference --      Peak Flow --      Pain Score 06/23/20 1356 0     Pain Loc --      Pain Edu? --      Excl. in [place]? --    No data found.  Updated Vital Signs BP 102/80   Pulse 68   Temp 98.3 F (36.8 C) (Oral)   Resp 20   SpO2 91%      Physical Exam Vitals and nursing note reviewed.  Constitutional:      General: He is not in  acute distress.    Appearance: He is well-developed. He is ill-appearing.  HENT:     Head: Normocephalic and atraumatic.     Right Ear: Tympanic membrane, ear canal and external ear normal.     Left Ear: Tympanic membrane, ear canal and external ear normal.     Nose: Nose normal.     Mouth/Throat:     Mouth: Mucous membranes are moist.     Pharynx: Posterior oropharyngeal erythema present.  Eyes:     Extraocular Movements: Extraocular movements intact.     Conjunctiva/sclera: Conjunctivae normal.     Pupils: Pupils are equal, round, and reactive to light.  Cardiovascular:     Rate and Rhythm: Normal rate and regular rhythm.     Heart sounds: Normal heart sounds. No murmur heard.   Pulmonary:     Effort: Pulmonary effort is normal. No respiratory distress.     Breath sounds: No stridor. Rhonchi present. No wheezing or rales.  Chest:     Chest wall: No tenderness.  Abdominal:     Palpations: Abdomen is soft.     Tenderness: There is no abdominal tenderness.  Musculoskeletal:        General: Tenderness present. Normal range of motion.     Cervical back: Normal range of motion and neck supple.     Comments: Chest wall and ribs  Skin:    General: Skin is warm and dry.     Capillary Refill: Capillary refill takes less than 2 seconds.     Coloration: Skin is pale.  Neurological:     General: No focal deficit present.     Mental Status: He is alert and oriented to person, place, and time.  Psychiatric:        Mood and Affect: Mood normal.        Behavior: Behavior normal.        Thought Content: Thought content normal.      UC Treatments / Results  Labs (all labs ordered are listed, but only abnormal results are displayed) Labs Reviewed  COVID-19, FLU A+B NAA   Narrative:    Test(s) 140142-Influenza A, NAA; 140143-Influenza B, NAA was developed and its performance characteristics determined by [facility]. It has not been cleared or approved by the Food and Drug  Administration. Performed at:  [address], [place], [place]  [id number] Lab Director: Rush Farmer MD, Phone:  [phone]    EKG   Radiology DG Chest 2 View  Result Date: 06/23/2020 CLINICAL DATA:  Cough, shortness of breath, fatigue EXAM: CHEST - 2 VIEW COMPARISON:  04/06/2014 FINDINGS: Post CABG changes. Heart size within normal limits. Atherosclerotic calcification of the aortic knob. Diffuse bilateral interstitial prominence. Hazy opacity within the right mid lung. No pleural effusion. No pneumothorax. IMPRESSION: Diffuse bilateral interstitial prominence with hazy right midlung opacity, which may reflect edema or atypical/viral infection. Electronically Signed   By: Davina Poke D.O.   On: 06/23/2020 14:24   DG Chest Port 1 View  Result Date: 06/23/2020 CLINICAL DATA:  Sepsis. EXAM: PORTABLE CHEST 1 VIEW COMPARISON:  Earlier film, same date. FINDINGS: The cardiac silhouette, mediastinal and hilar contours are within normal limits and stable, given the AP projection and portable technique. Stable surgical changes from bypass surgery. Persistent patchy E right basilar infiltrate and suspected small right pleural effusion. No pneumothorax. IMPRESSION: Persistent right basilar infiltrate and small right effusion. Electronically Signed   By: Marijo Sanes M.D.   On: 06/23/2020 17:29    Procedures Procedures (including critical care time)  Medications Ordered in UC Medications - No data to display  Initial Impression / Assessment and Plan / UC Course  I have reviewed the triage vital signs and the nursing notes.  Pertinent labs & imaging results that were available during my care of the patient were reviewed by me and considered in my medical decision making (see chart for details).    Viral URI with cough SOB Weakness Fatigue Fever Pleuritic Pain Hypoxia  Chest xray today shows patchy infiltrate to R lower lobe vs effusion Covid and flu test  obtained in office today Will inform of abnormal results as they arrive Given soft BP in the office and lower O2 saturation of 88-89%, need for supplemental oxygen to bring O2 up to 95%, discussed with patient that he would be best served in the ER (in combination with liver transplant history and immunosuppression). Concern for pneumonia/sepsis Verbalizes understanding and is in agreement with treatment plan To ER via POV Stable at discharge  Final Clinical Impressions(s) / UC Diagnoses   Final diagnoses:  Viral URI with cough  Hypoxia  SOB (shortness of breath)  Other fatigue  Weakness     Discharge Instructions     Go to the ER for further evaluation and treatment  I'm concerned for sepsis with your lower blood pressure and being sick over the last 5 days  I'm concerned about your oxygen level as well and you will need supplemental oxygen until you can maintain your levels on your own    ED Prescriptions    None     PDMP not reviewed this encounter.   Faustino Congress, NP 06/24/20 1800

## 2020-06-24 NOTE — Evaluation (Signed)
Physical Therapy Evaluation Patient Details Name: [name] MRN: [id number] DOB: [birth year] Today's Date: 06/24/2020   History of Present Illness  [name]  is a 75 y.o. male, with history of hypothyroidism, HTN, HLD, GERD, DMII, liver transplant, and more presents to the ED with a chief complaint of no appetite. Patient reports that it started 10 days ago. He reports that he hasn't wanted to eat or drink at all. He had eye surgery approx 1 month ago. He reports intermittent fevers 100 - 101. He reports that he just can't make himself eat or drink. He reports non productive cough as well. He feels congested, but has not been able to cough any of it up. He feels like it is loosening up today. Patient reports that since his cough started he feels abdominal pain, rib pain, and back pain that is worse with cough and better with rest. Patient denies dysphagia, abdominal pain, nausea, vomiting, dysuria, chest pain, palpitations, or diarrhea.    Clinical Impression  Patient functioning near baseline for functional mobility and gait. Patient able to complete bed mobility and transfer without assist. Patient dons sneakers with unilateral UE support while standing at bedside. Patient ambulates without AD with decreased cadence from baseline but overall WFL. Patient able to manage IV pole independently with ambulation and does not have loss of balance. Patient discharged to care of nursing for ambulation daily as tolerated for length of stay.      Follow Up Recommendations No PT follow up    Equipment Recommendations  None recommended by PT    Recommendations for Other Services       Precautions / Restrictions Precautions Precautions: None Restrictions Weight Bearing Restrictions: No      Mobility  Bed Mobility Overal bed mobility: Independent                  Transfers Overall transfer level: Independent Equipment used: None                 Ambulation/Gait Ambulation/Gait assistance: Independent Social research officer, government (Feet): 500 Feet Assistive device: None;IV Pole Gait Pattern/deviations: WFL(Within Functional Limits) Gait velocity: decreased   General Gait Details: ambulates with decreased cadence from baseline, ambulates without AD and able to manage IV pole without assist  Stairs            Wheelchair Mobility    Modified Rankin (Stroke Patients Only)       Balance Overall balance assessment: Independent                                           Pertinent Vitals/Pain Pain Assessment: No/denies pain    Home Living Family/patient expects to be discharged to:: Private residence Living Arrangements: Spouse/significant other Available Help at Discharge: Family           Home Equipment: None      Prior Function Level of Independence: Independent         Comments: Patient Metallurgist, works, drives, 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, newspaper        Extremity/Trunk Assessment   Upper Extremity Assessment Upper Extremity Assessment: Overall WFL for tasks assessed    Lower Extremity Assessment Lower Extremity Assessment: Overall WFL for tasks assessed    Cervical / Trunk Assessment Cervical / Trunk Assessment: Normal  Communication   Communication: No difficulties  Cognition Arousal/Alertness:  Awake/alert Behavior During Therapy: WFL for tasks assessed/performed Overall Cognitive Status: Within Functional Limits for tasks assessed                                        General Comments      Exercises     Assessment/Plan    PT Assessment Patent does not need any further PT services  PT Problem List         PT Treatment Interventions      PT Goals (Current goals can be found in the Care Plan section)  Acute Rehab PT Goals Patient Stated Goal: Return home PT Goal Formulation: With patient Time For Goal Achievement:  06/24/20 Potential to Achieve Goals: Good    Frequency     Barriers to discharge        Co-evaluation               AM-PAC PT "6 Clicks" Mobility  Outcome Measure Help needed turning from your back to your side while in a flat bed without using bedrails?: None Help needed moving from lying on your back to sitting on the side of a flat bed without using bedrails?: None Help needed moving to and from a bed to a chair (including a wheelchair)?: None Help needed standing up from a chair using your arms (e.g., wheelchair or bedside chair)?: None Help needed to walk in hospital room?: None Help needed climbing 3-5 steps with a railing? : None 6 Click Score: 24    End of Session   Activity Tolerance: Patient tolerated treatment well Patient left: in bed;with call bell/phone within reach Nurse Communication: Mobility status PT Visit Diagnosis: Unsteadiness on feet (R26.81);Muscle weakness (generalized) (M62.81)    Time: 4403-4742 PT Time Calculation (min) (ACUTE ONLY): 16 min   Charges:   PT Evaluation $PT Eval Low Complexity: 1 Low PT Treatments $Therapeutic Activity: 8-22 mins        3:39 PM, 06/24/20 Mearl Latin PT, DPT Physical Therapist at [facility]

## 2020-06-24 NOTE — Progress Notes (Signed)
Nutrition Brief Note  Patient identified on the Malnutrition Screening Tool (MST) Report  History of DM2, Liver transplant, HTN, HLD and GERD. Presents with complaint of poor appetite 10 days prior to admission.    Wt Readings from Last 15 Encounters:  06/24/20 79.8 kg  10/15/19 81.1 kg  06/26/19 76.2 kg  04/23/18 76.7 kg  12/14/17 78.5 kg  11/21/17 78.7 kg  04/20/15 73 kg  04/15/15 75.8 kg  04/06/14 70.3 kg  08/20/13 74.3 kg  04/29/13 73.4 kg  03/19/13 73.5 kg  12/28/12 81.8 kg  09/18/12 78.2 kg  06/27/12 78.1 kg    Body mass index is 27.55 kg/m. Patient meets criteria for overweight based on current BMI. No significant changes in weight noted.  Current diet order is CHO modified, patient is consuming approximately 75-100% of meals at this time. Feeds himself. Patient says he is feeling much better today and has eaten well.   Labs and medications reviewed.  BMP Latest Ref Rng & Units 06/24/2020 06/23/2020 12/14/2017  Glucose 70 - 99 mg/dL 64(L) 128(H) 127(H)  BUN 8 - 23 mg/dL 53(H) 49(H) 35(H)  Creatinine 0.61 - 1.24 mg/dL 2.29(H) 2.57(H) 1.56(H)  Sodium 135 - 145 mmol/L 134(L) 134(L) 138  Potassium 3.5 - 5.1 mmol/L 4.1 4.4 5.1  Chloride 98 - 111 mmol/L 102 97(L) 105  CO2 22 - 32 mmol/L 21(L) 24 26  Calcium 8.9 - 10.3 mg/dL 8.4(L) 9.2 9.6     No nutrition interventions warranted at this time. If nutrition issues arise, please consult RD.    Colman Cater MS,RD,CSG,LDN Contact: [email or web address]

## 2020-06-24 NOTE — Progress Notes (Signed)
Inpatient Diabetes Program Recommendations  AACE/ADA: New Consensus Statement on Inpatient Glycemic Control   Target Ranges:  Prepandial:   less than 140 mg/dL      Peak postprandial:   less than 180 mg/dL (1-2 hours)      Critically ill patients:  140 - 180 mg/dL   Results for RANE, BLITCH (MRN [id number]) as of 06/24/2020 11:59  Ref. Range 06/23/2020 22:18 06/24/2020 07:31 06/24/2020 08:21 06/24/2020 11:05  Glucose-Capillary Latest Ref Range: 70 - 99 mg/dL 99 54 (L) 71 155 (H)   Review of Glycemic Control  Diabetes history: DM2 Outpatient Diabetes medications: Farxiga 10 mg daily, Lantus 14 units daily Current orders for Inpatient glycemic control: Lantus 8 units daily, Novolog 0-15 units TID with meals, Novolog 0-5 units QHS  Inpatient Diabetes Program Recommendations:    Insulin: Please consider discontinuing Lantus for now. If CBGs become consistently elevated, could consider reordering.  NOTE: Noted consult. Diabetes coordinator working remotely today. Called patient over the phone to discuss DM control. Patient states that he takes [place] 10 mg daily and Lantus 14 units daily as an outpatient for DM control. Patient reports that over the past few days he has not taken the Lantus at all since his glucose has been running in low 100's mg/dl and he has not been eating. Patient reports that he thinks he has still been taking the [place] over the last few days. Patient reports that his glucose usually runs very well at home and he denies any issues with hypoglycemia. Noted glucose 54 mg/dl this morning and patient reports that he was feeling shaky and weak this morning during hypoglycemic episode. Discussed current insulin orders and informed patient that it would be recommended to discontinue the Lantus for now and provider could reorder it if glucose becomes consistently elevated. Patient verbalized understanding of information discussed and states he has no questions at this time related  to DM.  Thanks, Barnie Alderman, RN, MSN, CDE Diabetes Coordinator Inpatient Diabetes Program [phone] (Team Pager from 8am to 5pm)

## 2020-06-24 NOTE — Progress Notes (Signed)
PROGRESS NOTE    [name]  IFO:[id number] DOB: [birth year] DOA: 06/23/2020 PCP: Celene Squibb, MD    Brief Narrative:  75 y.o. male, with history of hypothyroidism, HTN, HLD, GERD, DMII, liver transplant, and more presents to the ED with a chief complaint of no appetite. Patient reports that it started 10 days ago. He reports that he hasn't wanted to eat or drink at all. He had eye surgery approx 1 month ago. He reports intermittent fevers 100 - 101. He reports that he just can't make himself eat or drink. He reports non productive cough as well. He feels congested, but has not been able to cough any of it up. He feels like it is loosening up today. Patient reports that since his cough started he feels abdominal pain, rib pain, and back pain that is worse with cough and better with rest. Patient denies dysphagia, abdominal pain, nausea, vomiting, dysuria, chest pain, palpitations, or diarrhea.   Assessment & Plan:   Principal Problem:   AKI (acute kidney injury) ([place]) Active Problems:   Jaundice   Type 2 diabetes mellitus ([place])   Community acquired pneumonia  1. AKI 1. Prerenal secondary to poor p.o. intake 2. Pt has been continued on IVF hydration 3. Hydrochlorothiazide and ARB are on hold 4. Cr has improved somewhat with IVF 5. Clinically dehydrated on exam with dry mucus membranes and poor skin turgor. Will continue IVF 6. Will repeat bmet in AM 2. CAP 1. X-ray finding on chest x-ray, fever reported from home 2. Immunocompromise with antirejection medications 3. Continue Rocephin and Zithromax as tolerated 4. Pt reports increased mucus. Have ordered flutter valve 3. Jaundice 1. Continue fluids per above 2. Bili peaked to 3.1, now down to 1.8 3. In the setting of liver transplant and volume down status 4. DMII 1. Hypoglycemic this AM with glucose into the 50's 2. Have consulted diabetic coordinator, recs appreciated 3. Have held lantus today. Glucose is now trending  up 4. Have re-ordered lower dose of lantus for the AM tomorrow 5. Continue SSI coverage 5. HTN 1. Holding home meds secondary to AKI 2. Blood pressure well controlled now 123/59 3. Add as needed medication if indicated 6. Hx of liver transplant 1. Continue antirejection medications  DVT prophylaxis: Heparin subq Code Status: Full Family Communication: Pt in room, family is at bedside  Status is: Inpatient  Remains inpatient appropriate because:IV treatments appropriate due to intensity of illness or inability to take PO and Inpatient level of care appropriate due to severity of illness   Dispo: The patient is from: Home              Anticipated d/c is to: Home              Patient currently is not medically stable to d/c.   Difficult to place patient No       Consultants:     Procedures:     Antimicrobials: Anti-infectives (From admission, onward)   Start     Dose/Rate Route Frequency Ordered Stop   06/24/20 1000  cefTRIAXone (ROCEPHIN) 1 g in sodium chloride 0.9 % 100 mL IVPB        1 g 200 mL/hr over 30 Minutes Intravenous Daily 06/23/20 2132     06/24/20 1000  azithromycin (ZITHROMAX) 500 mg in sodium chloride 0.9 % 250 mL IVPB        500 mg 250 mL/hr over 60 Minutes Intravenous Daily 06/23/20 2132     06/23/20  1630  cefTRIAXone (ROCEPHIN) 2 g in sodium chloride 0.9 % 100 mL IVPB  Status:  Discontinued        2 g 200 mL/hr over 30 Minutes Intravenous Every 24 hours 06/23/20 1617 06/23/20 2140   06/23/20 1630  azithromycin (ZITHROMAX) 500 mg in sodium chloride 0.9 % 250 mL IVPB  Status:  Discontinued        500 mg 250 mL/hr over 60 Minutes Intravenous Every 24 hours 06/23/20 1617 06/23/20 2140       Subjective: Reports feeling weaker today  Objective: Vitals:   06/23/20 2221 06/24/20 0437 06/24/20 0500 06/24/20 1358  BP: 107/78 (!) 107/56  120/65  Pulse: 86 74  68  Resp: 20 20  18   Temp: 98.9 F (37.2 C) 99.7 F (37.6 C)  97.7 F (36.5 C)   TempSrc: Oral   Oral  SpO2: 93% 90%  95%  Weight:   79.8 kg     Intake/Output Summary (Last 24 hours) at 06/24/2020 2831 Last data filed at 06/24/2020 1804 Gross per 24 hour  Intake 1305.45 ml  Output --  Net 1305.45 ml   Filed Weights   06/24/20 0500  Weight: 79.8 kg    Examination: General exam: Awake, laying in bed, in nad Respiratory system: Normal respiratory effort, no wheezing Cardiovascular system: regular rate, s1, s2 Gastrointestinal system: Soft, nondistended, positive BS Central nervous system: CN2-12 grossly intact, strength intact Extremities: Perfused, no clubbing Skin: Normal skin turgor, no notable skin lesions seen Psychiatry: Mood normal // no visual hallucinations   Data Reviewed: I have personally reviewed following labs and imaging studies  CBC: Recent Labs  Lab 06/23/20 1700 06/24/20 0627  WBC 5.8 5.7  NEUTROABS 4.3  --   HGB 13.5 11.5*  HCT 41.7 36.0*  MCV 94.8 93.0  PLT 148* 517*   Basic Metabolic Panel: Recent Labs  Lab 06/23/20 1700 06/24/20 0627  NA 134* 134*  K 4.4 4.1  CL 97* 102  CO2 24 21*  GLUCOSE 128* 64*  BUN 49* 53*  CREATININE 2.57* 2.29*  CALCIUM 9.2 8.4*  MG  --  2.1   GFR: CrCl cannot be calculated (Unknown ideal weight.). Liver Function Tests: Recent Labs  Lab 06/23/20 1700 06/24/20 0627  AST 23 20  ALT 12 10  ALKPHOS 55 41  BILITOT 3.1* 1.8*  PROT 7.8 6.1*  ALBUMIN 3.6 2.7*   No results for input(s): LIPASE, AMYLASE in the last 168 hours. No results for input(s): AMMONIA in the last 168 hours. Coagulation Profile: Recent Labs  Lab 06/23/20 1700  INR 1.4*   Cardiac Enzymes: No results for input(s): CKTOTAL, CKMB, CKMBINDEX, TROPONINI in the last 168 hours. BNP (last 3 results) No results for input(s): PROBNP in the last 8760 hours. HbA1C: No results for input(s): HGBA1C in the last 72 hours. CBG: Recent Labs  Lab 06/23/20 2218 06/24/20 0731 06/24/20 0821 06/24/20 1105 06/24/20 1607   GLUCAP 99 54* 71 155* 143*   Lipid Profile: No results for input(s): CHOL, HDL, LDLCALC, TRIG, CHOLHDL, LDLDIRECT in the last 72 hours. Thyroid Function Tests: No results for input(s): TSH, T4TOTAL, FREET4, T3FREE, THYROIDAB in the last 72 hours. Anemia Panel: No results for input(s): VITAMINB12, FOLATE, FERRITIN, TIBC, IRON, RETICCTPCT in the last 72 hours. Sepsis Labs: Recent Labs  Lab 06/23/20 1700 06/23/20 1914  LATICACIDVEN 1.2 0.9    Recent Results (from the past 240 hour(s))  Covid-19, Flu A+B ([facility])     Status: None   Collection Time:  06/23/20  1:47 PM   Specimen: Nasopharyngeal   Naso  Result Value Ref Range Status   SARS-CoV-2, NAA Not Detected Not Detected Final    Comment: This nucleic acid amplification test was developed and its performance characteristics determined by [employer]. Nucleic acid amplification tests include RT-PCR and TMA. This test has not been FDA cleared or approved. This test has been authorized by FDA under an Emergency Use Authorization (EUA). This test is only authorized for the duration of time the declaration that circumstances exist justifying the authorization of the emergency use of in vitro diagnostic tests for detection of SARS-CoV-2 virus and/or diagnosis of COVID-19 infection under section 564(b)(1) of the Act, 21 U.S.C. 665LDJ-5(T) (1), unless the authorization is terminated or revoked sooner. When diagnostic testing is negative, the possibility of a false negative result should be considered in the context of a patient's recent exposures and the presence of clinical signs and symptoms consistent with COVID-19. An individual without symptoms of COVID-19 and who is not shedding SARS-CoV-2 virus wo uld expect to have a negative (not detected) result in this assay.    Influenza A, NAA Not Detected Not Detected Final   Influenza B, NAA Not Detected Not Detected Final  Resp Panel by RT-PCR (Flu A&B, Covid)  Nasopharyngeal Swab     Status: None   Collection Time: 06/23/20  4:17 PM   Specimen: Nasopharyngeal Swab; Nasopharyngeal(NP) swabs in vial transport medium  Result Value Ref Range Status   SARS Coronavirus 2 by RT PCR NEGATIVE NEGATIVE Final    Comment: (NOTE) SARS-CoV-2 target nucleic acids are NOT DETECTED.  The SARS-CoV-2 RNA is generally detectable in upper respiratory specimens during the acute phase of infection. The lowest concentration of SARS-CoV-2 viral copies this assay can detect is 138 copies/mL. A negative result does not preclude SARS-Cov-2 infection and should not be used as the sole basis for treatment or other patient management decisions. A negative result may occur with  improper specimen collection/handling, submission of specimen other than nasopharyngeal swab, presence of viral mutation(s) within the areas targeted by this assay, and inadequate number of viral copies(<138 copies/mL). A negative result must be combined with clinical observations, patient history, and epidemiological information. The expected result is Negative.  Fact Sheet for Patients:  EntrepreneurPulse.com.au  Fact Sheet for Healthcare Providers:  IncredibleEmployment.be  This test is no t yet approved or cleared by the [place] FDA and  has been authorized for detection and/or diagnosis of SARS-CoV-2 by FDA under an Emergency Use Authorization (EUA). This EUA will remain  in effect (meaning this test can be used) for the duration of the COVID-19 declaration under Section 564(b)(1) of the Act, 21 U.S.C.section 360bbb-3(b)(1), unless the authorization is terminated  or revoked sooner.       Influenza A by PCR NEGATIVE NEGATIVE Final   Influenza B by PCR NEGATIVE NEGATIVE Final    Comment: (NOTE) The Xpert Xpress SARS-CoV-2/FLU/RSV plus assay is intended as an aid in the diagnosis of influenza from Nasopharyngeal swab specimens and should not be  used as a sole basis for treatment. Nasal washings and aspirates are unacceptable for Xpert Xpress SARS-CoV-2/FLU/RSV testing.  Fact Sheet for Patients: EntrepreneurPulse.com.au  Fact Sheet for Healthcare Providers: IncredibleEmployment.be  This test is not yet approved or cleared by the [place] FDA and has been authorized for detection and/or diagnosis of SARS-CoV-2 by FDA under an Emergency Use Authorization (EUA). This EUA will remain in effect (meaning this test can be used) for  the duration of the COVID-19 declaration under Section 564(b)(1) of the Act, 21 U.S.C. section 360bbb-3(b)(1), unless the authorization is terminated or revoked.  Performed at [facility], [address], [place], [place] [address]   Blood Culture (routine x 2)     Status: None (Preliminary result)   Collection Time: 06/23/20  5:00 PM   Specimen: BLOOD  Result Value Ref Range Status   Specimen Description BLOOD LEFT ANTECUBITAL  Final   Special Requests   Final    BOTTLES DRAWN AEROBIC AND ANAEROBIC Blood Culture adequate volume   Culture   Final    NO GROWTH < 12 HOURS Performed at [facility], [address] [place], [place] [address]    Report Status PENDING  Incomplete  Blood Culture (routine x 2)     Status: None (Preliminary result)   Collection Time: 06/23/20  5:00 PM   Specimen: BLOOD RIGHT FOREARM  Result Value Ref Range Status   Specimen Description BLOOD RIGHT FOREARM  Final   Special Requests   Final    BOTTLES DRAWN AEROBIC AND ANAEROBIC Blood Culture adequate volume   Culture   Final    NO GROWTH < 12 HOURS Performed at [facility], [address] [place], [place] [address]    Report Status PENDING  Incomplete     Radiology Studies: DG Chest 2 View  Result Date: 06/23/2020 CLINICAL DATA:  Cough, shortness of breath, fatigue EXAM: CHEST - 2 VIEW COMPARISON:  04/06/2014 FINDINGS: Post CABG changes. Heart size within normal limits.  Atherosclerotic calcification of the aortic knob. Diffuse bilateral interstitial prominence. Hazy opacity within the right mid lung. No pleural effusion. No pneumothorax. IMPRESSION: Diffuse bilateral interstitial prominence with hazy right midlung opacity, which may reflect edema or atypical/viral infection. Electronically Signed   By: Davina Poke D.O.   On: 06/23/2020 14:24   DG Chest Port 1 View  Result Date: 06/23/2020 CLINICAL DATA:  Sepsis. EXAM: PORTABLE CHEST 1 VIEW COMPARISON:  Earlier film, same date. FINDINGS: The cardiac silhouette, mediastinal and hilar contours are within normal limits and stable, given the AP projection and portable technique. Stable surgical changes from bypass surgery. Persistent patchy E right basilar infiltrate and suspected small right pleural effusion. No pneumothorax. IMPRESSION: Persistent right basilar infiltrate and small right effusion. Electronically Signed   By: Marijo Sanes M.D.   On: 06/23/2020 17:29    Scheduled Meds: . aspirin  81 mg Oral Daily  . atorvastatin  20 mg Oral QHS  . carvedilol  6.25 mg Oral QHS  . cycloSPORINE modified  200 mg Oral QHS  . cycloSPORINE modified  300 mg Oral Daily  . heparin  5,000 Units Subcutaneous Q8H  . insulin aspart  0-15 Units Subcutaneous TID WC  . insulin aspart  0-5 Units Subcutaneous QHS  . [START ON 06/25/2020] levothyroxine  125 mcg Oral Q0600  . mycophenolate  250 mg Oral BID  . neomycin-polymyxin b-dexamethasone   Left Eye BID   Continuous Infusions: . sodium chloride 75 mL/hr at 06/24/20 1718  . azithromycin 500 mg (06/24/20 1033)  . cefTRIAXone (ROCEPHIN)  IV 1 g (06/24/20 1203)     LOS: 1 day   Marylu Lund, MD [facility] Pager On Amion  If 7PM-7AM, please contact night-coverage 06/24/2020, 6:22 PM

## 2020-06-24 NOTE — Progress Notes (Signed)
Patient's CBG is 54. Patient is sitting up with breakfast tray now, with an extra juice, eating. Will continue to monitor.

## 2020-06-25 LAB — CBC
HCT: 36.6 % — ABNORMAL LOW (ref 39.0–52.0)
Hemoglobin: 11.9 g/dL — ABNORMAL LOW (ref 13.0–17.0)
MCH: 30.7 pg (ref 26.0–34.0)
MCHC: 32.5 g/dL (ref 30.0–36.0)
MCV: 94.3 fL (ref 80.0–100.0)
Platelets: 155 10*3/uL (ref 150–400)
RBC: 3.88 MIL/uL — ABNORMAL LOW (ref 4.22–5.81)
RDW: 13.8 % (ref 11.5–15.5)
WBC: 5.4 10*3/uL (ref 4.0–10.5)
nRBC: 0 % (ref 0.0–0.2)

## 2020-06-25 LAB — COMPREHENSIVE METABOLIC PANEL
ALT: 9 U/L (ref 0–44)
AST: 25 U/L (ref 15–41)
Albumin: 2.7 g/dL — ABNORMAL LOW (ref 3.5–5.0)
Alkaline Phosphatase: 48 U/L (ref 38–126)
Anion gap: 8 (ref 5–15)
BUN: 56 mg/dL — ABNORMAL HIGH (ref 8–23)
CO2: 23 mmol/L (ref 22–32)
Calcium: 8.4 mg/dL — ABNORMAL LOW (ref 8.9–10.3)
Chloride: 103 mmol/L (ref 98–111)
Creatinine, Ser: 2.23 mg/dL — ABNORMAL HIGH (ref 0.61–1.24)
GFR, Estimated: 30 mL/min — ABNORMAL LOW (ref >60)
Glucose, Bld: 100 mg/dL — ABNORMAL HIGH (ref 70–99)
Potassium: 4.1 mmol/L (ref 3.5–5.1)
Sodium: 134 mmol/L — ABNORMAL LOW (ref 135–145)
Total Bilirubin: 1.2 mg/dL (ref 0.3–1.2)
Total Protein: 6.1 g/dL — ABNORMAL LOW (ref 6.5–8.1)

## 2020-06-25 LAB — URINE CULTURE: Culture: NO GROWTH

## 2020-06-25 LAB — GLUCOSE, CAPILLARY: Glucose-Capillary: 128 mg/dL — ABNORMAL HIGH (ref 70–99)

## 2020-06-25 LAB — HEMOGLOBIN A1C
Hgb A1c MFr Bld: 6.2 % — ABNORMAL HIGH (ref 4.8–5.6)
Mean Plasma Glucose: 131 mg/dL

## 2020-06-25 MED ORDER — AZITHROMYCIN 250 MG PO TABS: ORAL_TABLET | ORAL | 0 refills | Status: AC

## 2020-06-25 MED ORDER — BENZONATATE 100 MG PO CAPS: 100.0000 mg | ORAL_CAPSULE | Freq: Three times a day (TID) | ORAL | 0 refills | Status: AC | PRN

## 2020-06-25 MED ORDER — SITAGLIPTIN PHOSPHATE 50 MG PO TABS: 50.0000 mg | ORAL_TABLET | Freq: Every day | ORAL | 0 refills | Status: DC

## 2020-06-25 MED ORDER — CEFDINIR 300 MG PO CAPS: 300.0000 mg | ORAL_CAPSULE | Freq: Two times a day (BID) | ORAL | 0 refills | Status: AC

## 2020-06-25 NOTE — Discharge Summary (Signed)
Physician Discharge Summary  [name] VEH:[id number] DOB: November 07, 1945 DOA: 06/23/2020  PCP: Celene Squibb, MD  Admit date: 06/23/2020 Discharge date: 06/25/2020  Admitted From: Home Disposition:  home  Recommendations for Outpatient Follow-up:  1. Follow up with PCP in 1-2 weeks 2. Please monitor patient's glucose trends. Patient presented hypoglycemic with A1c of 6.2, thus regimen was change to Januvia monotherapy  Discharge Condition:Improved CODE STATUS:Full Diet recommendation: Diabetic   Brief/Interim Summary: 75 y.o.male,with history of hypothyroidism, HTN, HLD, GERD, DMII, liver transplant, and more presents to the ED with a chief complaint of no appetite. Patient reports that it started 10 days ago. He reports that he hasn't wanted to eat or drink at all. He had eye surgery approx 1 month ago. He reports intermittent fevers 100 - 101. He reports that he just can't make himself eat or drink. He reports non productive cough as well. He feels congested, but has not been able to cough any of it up. He feels like it is loosening up today. Patient reports that since his cough started he feels abdominal pain, rib pain, and back pain that is worse with cough and better with rest. Patient denies dysphagia, abdominal pain, nausea, vomiting, dysuria, chest pain, palpitations, or diarrhea.  Discharge Diagnoses:  Principal Problem:   AKI (acute kidney injury) ([place]) Active Problems:   Jaundice   Type 2 diabetes mellitus ([place])   Community acquired pneumonia  1. AKI 1. Prerenal secondary to poor p.o. intake 2. Pt has been continued on IVF hydration 3. Hydrochlorothiazide and ARB were on hold 4. Cr has shown improvement with IVF 5. Recommend repeat bmet in 1-2 weeks 2. CAP 1. X-ray finding on chest x-ray, fever reported from home 2. Immunocompromise with antirejection medications 3. Continued Rocephin and Zithromax as tolerated, transitioned to PO azithromycin with omnicef on  d/c 4. Pt reports increased mucus. Have ordered flutter valve 3. Jaundice 1. Continue fluids per above 2. Bili peaked to 3.1, now down to 1.8 3. In the setting of liver transplant and volume down status 4. DMII 1. Hypoglycemic this AM with glucose into the 50's 2. Have consulted diabetic coordinator, recs appreciated 3. Glucose remained well controlled off insulin 4. A1c noted to be 6.2 5. Pt was continued on SSI coverage while in hospital 6. Will discharge pt on januvia on d/c. Pt is aware that he may likely require insulin again in the future 5. HTN 1. Initially held home meds secondary to AKI 2. Blood pressure well controlled now 6. Hx of liver transplant 1. Continue antirejection medications  Discharge Instructions   Allergies as of 06/25/2020   No Known Allergies     Medication List    STOP taking these medications   dapagliflozin propanediol 10 MG Tabs tablet Commonly known as: FARXIGA   Entresto 24-26 MG Generic drug: sacubitril-valsartan   hydrochlorothiazide 25 MG tablet Commonly known as: HYDRODIURIL   insulin glargine 100 UNIT/ML injection Commonly known as: LANTUS   losartan 25 MG tablet Commonly known as: COZAAR     TAKE these medications   aspirin 81 MG tablet Take 81 mg by mouth daily.   atorvastatin 20 MG tablet Commonly known as: LIPITOR Take 20 mg by mouth at bedtime.   azithromycin 250 MG tablet Commonly known as: Zithromax Z-Pak Take 1 tab po daily x 5 more days, zero refills   benzonatate 100 MG capsule Commonly known as: Tessalon Perles Take 1 capsule (100 mg total) by mouth 3 (three) times daily as  needed for cough.   CALCIUM 600+D PO Take 1 tablet by mouth daily.   carvedilol 6.25 MG tablet Commonly known as: COREG Take 6.25 mg by mouth at bedtime.   cefdinir 300 MG capsule Commonly known as: OMNICEF Take 1 capsule (300 mg total) by mouth 2 (two) times daily for 5 days.   cycloSPORINE modified 100 MG capsule Commonly  known as: NEORAL Take 200-300 mg by mouth See admin instructions. Take 300mg  by mouth in the morning and 200 mg in the evening   erythromycin ophthalmic ointment Place a 1/2 inch ribbon of ointment into/onto the lower eyelid.   levothyroxine 125 MCG tablet Commonly known as: SYNTHROID Take 125 mcg by mouth daily before breakfast.   MAGOX 400 PO Take 1,200 mg by mouth daily.   mycophenolate 250 MG capsule Commonly known as: CELLCEPT Take 250 mg by mouth 2 (two) times daily.   neomycin-polymyxin-dexameth 0.1 % Oint Commonly known as: MAXITROL Place 1 application into the left eye in the morning and at bedtime.   sitaGLIPtin 50 MG tablet Commonly known as: Januvia Take 1 tablet (50 mg total) by mouth daily.       Follow-up Information    Celene Squibb, MD. Schedule an appointment as soon as possible for a visit in 2 week(s).   Specialty: Internal Medicine Contact information: [place] [address] [phone]              No Known Allergies  Procedures/Studies: DG Chest 2 View  Result Date: 06/23/2020 CLINICAL DATA:  Cough, shortness of breath, fatigue EXAM: CHEST - 2 VIEW COMPARISON:  04/06/2014 FINDINGS: Post CABG changes. Heart size within normal limits. Atherosclerotic calcification of the aortic knob. Diffuse bilateral interstitial prominence. Hazy opacity within the right mid lung. No pleural effusion. No pneumothorax. IMPRESSION: Diffuse bilateral interstitial prominence with hazy right midlung opacity, which may reflect edema or atypical/viral infection. Electronically Signed   By: Davina Poke D.O.   On: 06/23/2020 14:24   DG Chest Port 1 View  Result Date: 06/23/2020 CLINICAL DATA:  Sepsis. EXAM: PORTABLE CHEST 1 VIEW COMPARISON:  Earlier film, same date. FINDINGS: The cardiac silhouette, mediastinal and hilar contours are within normal limits and stable, given the AP projection and portable technique. Stable surgical changes from  bypass surgery. Persistent patchy E right basilar infiltrate and suspected small right pleural effusion. No pneumothorax. IMPRESSION: Persistent right basilar infiltrate and small right effusion. Electronically Signed   By: Marijo Sanes M.D.   On: 06/23/2020 17:29     Subjective: Eager to go home  Discharge Exam: Vitals:   06/24/20 2113 06/25/20 0615  BP: 117/68 134/78  Pulse: 81 77  Resp: 20 20  Temp: 98.4 F (36.9 C) 97.6 F (36.4 C)  SpO2: 90% 91%   Vitals:   06/24/20 0500 06/24/20 1358 06/24/20 2113 06/25/20 0615  BP:  120/65 117/68 134/78  Pulse:  68 81 77  Resp:  18 20 20   Temp:  97.7 F (36.5 C) 98.4 F (36.9 C) 97.6 F (36.4 C)  TempSrc:  Oral  Oral  SpO2:  95% 90% 91%  Weight: 79.8 kg       General: Pt is alert, awake, not in acute distress Cardiovascular: RRR, S1/S2 +, no rubs, no gallops Respiratory: CTA bilaterally, no wheezing, no rhonchi Abdominal: Soft, NT, ND, bowel sounds + Extremities: no edema, no cyanosis   The results of significant diagnostics from this hospitalization (including imaging, microbiology, ancillary and laboratory) are  listed below for reference.     Microbiology: Recent Results (from the past 240 hour(s))  Covid-19, Flu A+B ([facility])     Status: None   Collection Time: 06/23/20  1:47 PM   Specimen: Nasopharyngeal   Naso  Result Value Ref Range Status   SARS-CoV-2, NAA Not Detected Not Detected Final    Comment: This nucleic acid amplification test was developed and its performance characteristics determined by [employer], Dickinson and Company. Nucleic acid amplification tests include RT-PCR and TMA. This test has not been FDA cleared or approved. This test has been authorized by FDA under an Emergency Use Authorization (EUA). This test is only authorized for the duration of time the declaration that circumstances exist justifying the authorization of the emergency use of in vitro diagnostic tests for detection of SARS-CoV-2 virus  and/or diagnosis of COVID-19 infection under section 564(b)(1) of the Act, 21 U.S.C. 950DTO-6(Z) (1), unless the authorization is terminated or revoked sooner. When diagnostic testing is negative, the possibility of a false negative result should be considered in the context of a patient's recent exposures and the presence of clinical signs and symptoms consistent with COVID-19. An individual without symptoms of COVID-19 and who is not shedding SARS-CoV-2 virus wo uld expect to have a negative (not detected) result in this assay.    Influenza A, NAA Not Detected Not Detected Final   Influenza B, NAA Not Detected Not Detected Final  Resp Panel by RT-PCR (Flu A&B, Covid) Nasopharyngeal Swab     Status: None   Collection Time: 06/23/20  4:17 PM   Specimen: Nasopharyngeal Swab; Nasopharyngeal(NP) swabs in vial transport medium  Result Value Ref Range Status   SARS Coronavirus 2 by RT PCR NEGATIVE NEGATIVE Final    Comment: (NOTE) SARS-CoV-2 target nucleic acids are NOT DETECTED.  The SARS-CoV-2 RNA is generally detectable in upper respiratory specimens during the acute phase of infection. The lowest concentration of SARS-CoV-2 viral copies this assay can detect is 138 copies/mL. A negative result does not preclude SARS-Cov-2 infection and should not be used as the sole basis for treatment or other patient management decisions. A negative result may occur with  improper specimen collection/handling, submission of specimen other than nasopharyngeal swab, presence of viral mutation(s) within the areas targeted by this assay, and inadequate number of viral copies(<138 copies/mL). A negative result must be combined with clinical observations, patient history, and epidemiological information. The expected result is Negative.  Fact Sheet for Patients:  EntrepreneurPulse.com.au  Fact Sheet for Healthcare Providers:  IncredibleEmployment.be  This test is  no t yet approved or cleared by the [place] FDA and  has been authorized for detection and/or diagnosis of SARS-CoV-2 by FDA under an Emergency Use Authorization (EUA). This EUA will remain  in effect (meaning this test can be used) for the duration of the COVID-19 declaration under Section 564(b)(1) of the Act, 21 U.S.C.section 360bbb-3(b)(1), unless the authorization is terminated  or revoked sooner.       Influenza A by PCR NEGATIVE NEGATIVE Final   Influenza B by PCR NEGATIVE NEGATIVE Final    Comment: (NOTE) The Xpert Xpress SARS-CoV-2/FLU/RSV plus assay is intended as an aid in the diagnosis of influenza from Nasopharyngeal swab specimens and should not be used as a sole basis for treatment. Nasal washings and aspirates are unacceptable for Xpert Xpress SARS-CoV-2/FLU/RSV testing.  Fact Sheet for Patients: EntrepreneurPulse.com.au  Fact Sheet for Healthcare Providers: IncredibleEmployment.be  This test is not yet approved or cleared by the [place] FDA  and has been authorized for detection and/or diagnosis of SARS-CoV-2 by FDA under an Emergency Use Authorization (EUA). This EUA will remain in effect (meaning this test can be used) for the duration of the COVID-19 declaration under Section 564(b)(1) of the Act, 21 U.S.C. section 360bbb-3(b)(1), unless the authorization is terminated or revoked.  Performed at [facility], [address] [place], [place] [address]   Blood Culture (routine x 2)     Status: None (Preliminary result)   Collection Time: 06/23/20  5:00 PM   Specimen: BLOOD  Result Value Ref Range Status   Specimen Description BLOOD LEFT ANTECUBITAL  Final   Special Requests   Final    BOTTLES DRAWN AEROBIC AND ANAEROBIC Blood Culture adequate volume   Culture   Final    NO GROWTH 2 DAYS Performed at [facility], [address], [place], [place] [phone]    Report Status PENDING  Incomplete  Blood Culture  (routine x 2)     Status: None (Preliminary result)   Collection Time: 06/23/20  5:00 PM   Specimen: BLOOD RIGHT FOREARM  Result Value Ref Range Status   Specimen Description BLOOD RIGHT FOREARM  Final   Special Requests   Final    BOTTLES DRAWN AEROBIC AND ANAEROBIC Blood Culture adequate volume   Culture   Final    NO GROWTH 2 DAYS Performed at [facility], [address] [place], [place] [address]    Report Status PENDING  Incomplete  Urine culture     Status: None   Collection Time: 06/23/20  6:15 PM   Specimen: Urine, Random  Result Value Ref Range Status   Specimen Description   Final    URINE, RANDOM Performed at [facility], [address], [place], [place] [address]    Special Requests   Final    NONE Performed at [facility], [address], [place], [place] [address]    Culture   Final    NO GROWTH Performed at [facility], [place] [address] [place], [place] [address]    Report Status 06/25/2020 FINAL  Final     Labs: BNP (last 3 results) No results for input(s): BNP in the last 8760 hours. Basic Metabolic Panel: Recent Labs  Lab 06/23/20 1700 06/24/20 0627 06/25/20 0603  NA 134* 134* 134*  K 4.4 4.1 4.1  CL 97* 102 103  CO2 24 21* 23  GLUCOSE 128* 64* 100*  BUN 49* 53* 56*  CREATININE 2.57* 2.29* 2.23*  CALCIUM 9.2 8.4* 8.4*  MG  --  2.1  --    Liver Function Tests: Recent Labs  Lab 06/23/20 1700 06/24/20 0627 06/25/20 0603  AST 23 20 25   ALT 12 10 9   ALKPHOS 55 41 48  BILITOT 3.1* 1.8* 1.2  PROT 7.8 6.1* 6.1*  ALBUMIN 3.6 2.7* 2.7*   No results for input(s): LIPASE, AMYLASE in the last 168 hours. No results for input(s): AMMONIA in the last 168 hours. CBC: Recent Labs  Lab 06/23/20 1700 06/24/20 0627 06/25/20 0603  WBC 5.8 5.7 5.4  NEUTROABS 4.3  --   --   HGB 13.5 11.5* 11.9*  HCT 41.7 36.0* 36.6*  MCV 94.8 93.0 94.3  PLT 148* 144* 155   Cardiac Enzymes: No results for input(s): CKTOTAL, CKMB, CKMBINDEX, TROPONINI in  the last 168 hours. BNP: Invalid input(s): POCBNP CBG: Recent Labs  Lab 06/24/20 0821 06/24/20 1105 06/24/20 1607 06/24/20 2136 06/25/20 0735  GLUCAP 71 155* 143* 116* 128*  D-Dimer No results for input(s): DDIMER in the last 72 hours. Hgb A1c Recent Labs    06/23/20 1700  HGBA1C 6.2*   Lipid Profile No results for input(s): CHOL, HDL, LDLCALC, TRIG, CHOLHDL, LDLDIRECT in the last 72 hours. Thyroid function studies No results for input(s): TSH, T4TOTAL, T3FREE, THYROIDAB in the last 72 hours.  Invalid input(s): FREET3 Anemia work up No results for input(s): VITAMINB12, FOLATE, FERRITIN, TIBC, IRON, RETICCTPCT in the last 72 hours. Urinalysis    Component Value Date/Time   COLORURINE YELLOW 06/23/2020 1815   APPEARANCEUR HAZY (A) 06/23/2020 1815   LABSPEC 1.024 06/23/2020 1815   PHURINE 5.0 06/23/2020 1815   GLUCOSEU 150 (A) 06/23/2020 1815   HGBUR NEGATIVE 06/23/2020 1815   BILIRUBINUR NEGATIVE 06/23/2020 1815   KETONESUR NEGATIVE 06/23/2020 1815   PROTEINUR 30 (A) 06/23/2020 1815   UROBILINOGEN 0.2 04/06/2012 1206   NITRITE NEGATIVE 06/23/2020 1815   LEUKOCYTESUR NEGATIVE 06/23/2020 1815   Sepsis Labs Invalid input(s): PROCALCITONIN,  WBC,  LACTICIDVEN Microbiology Recent Results (from the past 240 hour(s))  Covid-19, Flu A+B ([facility])     Status: None   Collection Time: 06/23/20  1:47 PM   Specimen: Nasopharyngeal   Naso  Result Value Ref Range Status   SARS-CoV-2, NAA Not Detected Not Detected Final    Comment: This nucleic acid amplification test was developed and its performance characteristics determined by [employer], [employer] and Company. Nucleic acid amplification tests include RT-PCR and TMA. This test has not been FDA cleared or approved. This test has been authorized by FDA under an Emergency Use Authorization (EUA). This test is only authorized for the duration of time the declaration that circumstances exist justifying the authorization of the emergency  use of in vitro diagnostic tests for detection of SARS-CoV-2 virus and/or diagnosis of COVID-19 infection under section 564(b)(1) of the Act, 21 U.S.C. 469GEX-5(M) (1), unless the authorization is terminated or revoked sooner. When diagnostic testing is negative, the possibility of a false negative result should be considered in the context of a patient's recent exposures and the presence of clinical signs and symptoms consistent with COVID-19. An individual without symptoms of COVID-19 and who is not shedding SARS-CoV-2 virus wo uld expect to have a negative (not detected) result in this assay.    Influenza A, NAA Not Detected Not Detected Final   Influenza B, NAA Not Detected Not Detected Final  Resp Panel by RT-PCR (Flu A&B, Covid) Nasopharyngeal Swab     Status: None   Collection Time: 06/23/20  4:17 PM   Specimen: Nasopharyngeal Swab; Nasopharyngeal(NP) swabs in vial transport medium  Result Value Ref Range Status   SARS Coronavirus 2 by RT PCR NEGATIVE NEGATIVE Final    Comment: (NOTE) SARS-CoV-2 target nucleic acids are NOT DETECTED.  The SARS-CoV-2 RNA is generally detectable in upper respiratory specimens during the acute phase of infection. The lowest concentration of SARS-CoV-2 viral copies this assay can detect is 138 copies/mL. A negative result does not preclude SARS-Cov-2 infection and should not be used as the sole basis for treatment or other patient management decisions. A negative result may occur with  improper specimen collection/handling, submission of specimen other than nasopharyngeal swab, presence of viral mutation(s) within the areas targeted by this assay, and inadequate number of viral copies(<138 copies/mL). A negative result must be combined with clinical observations, patient history, and epidemiological information. The expected result is Negative.  Fact Sheet for Patients:  EntrepreneurPulse.com.au  Fact Sheet for Healthcare  Providers:  IncredibleEmployment.be  This test  is no t yet approved or cleared by the [place] and  has been authorized for detection and/or diagnosis of SARS-CoV-2 by FDA under an Emergency Use Authorization (EUA). This EUA will remain  in effect (meaning this test can be used) for the duration of the COVID-19 declaration under Section 564(b)(1) of the Act, 21 U.S.C.section 360bbb-3(b)(1), unless the authorization is terminated  or revoked sooner.       Influenza A by PCR NEGATIVE NEGATIVE Final   Influenza B by PCR NEGATIVE NEGATIVE Final    Comment: (NOTE) The Xpert Xpress SARS-CoV-2/FLU/RSV plus assay is intended as an aid in the diagnosis of influenza from Nasopharyngeal swab specimens and should not be used as a sole basis for treatment. Nasal washings and aspirates are unacceptable for Xpert Xpress SARS-CoV-2/FLU/RSV testing.  Fact Sheet for Patients: EntrepreneurPulse.com.au  Fact Sheet for Healthcare Providers: IncredibleEmployment.be  This test is not yet approved or cleared by the [place] FDA and has been authorized for detection and/or diagnosis of SARS-CoV-2 by FDA under an Emergency Use Authorization (EUA). This EUA will remain in effect (meaning this test can be used) for the duration of the COVID-19 declaration under Section 564(b)(1) of the Act, 21 U.S.C. section 360bbb-3(b)(1), unless the authorization is terminated or revoked.  Performed at [facility], [address] [place], [place] [address]   Blood Culture (routine x 2)     Status: None (Preliminary result)   Collection Time: 06/23/20  5:00 PM   Specimen: BLOOD  Result Value Ref Range Status   Specimen Description BLOOD LEFT ANTECUBITAL  Final   Special Requests   Final    BOTTLES DRAWN AEROBIC AND ANAEROBIC Blood Culture adequate volume   Culture   Final    NO GROWTH 2 DAYS Performed at [facility], [address],  [place], [place] [address]    Report Status PENDING  Incomplete  Blood Culture (routine x 2)     Status: None (Preliminary result)   Collection Time: 06/23/20  5:00 PM   Specimen: BLOOD RIGHT FOREARM  Result Value Ref Range Status   Specimen Description BLOOD RIGHT FOREARM  Final   Special Requests   Final    BOTTLES DRAWN AEROBIC AND ANAEROBIC Blood Culture adequate volume   Culture   Final    NO GROWTH 2 DAYS Performed at [facility], [address] [place], [place] [address]    Report Status PENDING  Incomplete  Urine culture     Status: None   Collection Time: 06/23/20  6:15 PM   Specimen: Urine, Random  Result Value Ref Range Status   Specimen Description   Final    URINE, RANDOM Performed at [facility], [address], [place], [place] [address]    Special Requests   Final    NONE Performed at [facility], [address], [place], [place] [address]    Culture   Final    NO GROWTH Performed at [facility], [place] [address], [place], [place] [address]    Report Status 06/25/2020 FINAL  Final   Time spent: 16min  SIGNED:   Marylu Lund, MD  [facility] 06/25/2020, 9:50 AM  If 7PM-7AM, please contact night-coverage

## 2020-06-25 NOTE — TOC Transition Note (Signed)
Transition of Care [facility]) - CM/SW Discharge Note   Patient Details  Name: [name] MRN: [id number] Date of Birth: 12-03-1945  [facility] CM/SW Contact:  Iona Beard, [place] Phone Number: 06/25/2020, 10:25 AM   Clinical Narrative:    Pt is high risk for readmission. CSW spoke with pt on the phone to complete assessment. Pt states he lives with his wife. Pt states he is independent in completing his ADLs and he works daily. Pt provides his own transportation. Pt does not think he has had [place] services before. Pt does not use any DME. Pt presents with no d/c needs from TOC. TOC signing off.   Final next level of care: Home/Self Care Barriers to Discharge: Barriers Resolved   Patient Goals and CMS Choice Patient states their goals for this hospitalization and ongoing recovery are:: Return home CMS Medicare.gov Compare Post Acute Care list provided to:: Patient Choice offered to / list presented to : Patient  Discharge Placement                       Discharge Plan and Services                DME Arranged: N/A DME Agency: NA       HH Arranged: NA HH Agency: NA        Social Determinants of Health (SDOH) Interventions     Readmission Risk Interventions Readmission Risk Prevention Plan 06/25/2020  Transportation Screening Complete  HRI or [place] Complete  Social Work Consult for [place] Planning/Counseling Complete  Palliative Care Screening Not Applicable  Medication Review Press photographer) Complete  Some recent data might be hidden

## 2020-06-28 LAB — CULTURE, BLOOD (ROUTINE X 2)
Culture: NO GROWTH
Culture: NO GROWTH
Special Requests: ADEQUATE
Special Requests: ADEQUATE

## 2020-06-30 DIAGNOSIS — D899 Disorder involving the immune mechanism, unspecified: Secondary | ICD-10-CM | POA: Diagnosis not present

## 2020-06-30 DIAGNOSIS — Z944 Liver transplant status: Secondary | ICD-10-CM | POA: Diagnosis not present

## 2020-07-07 DIAGNOSIS — B029 Zoster without complications: Secondary | ICD-10-CM | POA: Diagnosis not present

## 2020-07-20 DIAGNOSIS — Z944 Liver transplant status: Secondary | ICD-10-CM | POA: Diagnosis not present

## 2020-07-20 DIAGNOSIS — D899 Disorder involving the immune mechanism, unspecified: Secondary | ICD-10-CM | POA: Diagnosis not present

## 2020-07-21 DIAGNOSIS — L570 Actinic keratosis: Secondary | ICD-10-CM | POA: Diagnosis not present

## 2020-08-24 DIAGNOSIS — Z944 Liver transplant status: Secondary | ICD-10-CM | POA: Diagnosis not present

## 2020-08-24 DIAGNOSIS — D899 Disorder involving the immune mechanism, unspecified: Secondary | ICD-10-CM | POA: Diagnosis not present

## 2020-09-08 DIAGNOSIS — Z944 Liver transplant status: Secondary | ICD-10-CM | POA: Diagnosis not present

## 2020-09-08 DIAGNOSIS — D899 Disorder involving the immune mechanism, unspecified: Secondary | ICD-10-CM | POA: Diagnosis not present

## 2020-09-17 DIAGNOSIS — H16212 Exposure keratoconjunctivitis, left eye: Secondary | ICD-10-CM | POA: Diagnosis not present

## 2020-09-17 DIAGNOSIS — C441292 Squamous cell carcinoma of skin of left lower eyelid, including canthus: Secondary | ICD-10-CM | POA: Diagnosis not present

## 2020-09-22 DIAGNOSIS — L570 Actinic keratosis: Secondary | ICD-10-CM | POA: Diagnosis not present

## 2020-09-22 DIAGNOSIS — D485 Neoplasm of uncertain behavior of skin: Secondary | ICD-10-CM | POA: Diagnosis not present

## 2020-09-23 DIAGNOSIS — Z944 Liver transplant status: Secondary | ICD-10-CM | POA: Diagnosis not present

## 2020-09-23 DIAGNOSIS — D899 Disorder involving the immune mechanism, unspecified: Secondary | ICD-10-CM | POA: Diagnosis not present

## 2020-10-05 ENCOUNTER — Encounter: Payer: Self-pay | Admitting: Internal Medicine

## 2020-10-07 ENCOUNTER — Other Ambulatory Visit: Payer: Self-pay

## 2020-10-07 ENCOUNTER — Ambulatory Visit (HOSPITAL_COMMUNITY)
Admission: RE | Admit: 2020-10-07 | Discharge: 2020-10-07 | Disposition: A | Discharge status: Home / Self Care | Payer: Medicare Other | Source: Ambulatory Visit | Attending: Adult Health Nurse Practitioner | Admitting: Adult Health Nurse Practitioner

## 2020-10-07 ENCOUNTER — Other Ambulatory Visit (HOSPITAL_COMMUNITY): Payer: Self-pay | Admitting: Adult Health Nurse Practitioner

## 2020-10-07 DIAGNOSIS — Z23 Encounter for immunization: Secondary | ICD-10-CM | POA: Diagnosis not present

## 2020-10-07 DIAGNOSIS — I5042 Chronic combined systolic (congestive) and diastolic (congestive) heart failure: Secondary | ICD-10-CM | POA: Diagnosis not present

## 2020-10-07 DIAGNOSIS — J0100 Acute maxillary sinusitis, unspecified: Secondary | ICD-10-CM | POA: Diagnosis not present

## 2020-10-07 DIAGNOSIS — E1122 Type 2 diabetes mellitus with diabetic chronic kidney disease: Secondary | ICD-10-CM | POA: Diagnosis not present

## 2020-10-07 DIAGNOSIS — J9811 Atelectasis: Secondary | ICD-10-CM | POA: Diagnosis not present

## 2020-10-07 DIAGNOSIS — E079 Disorder of thyroid, unspecified: Secondary | ICD-10-CM | POA: Diagnosis not present

## 2020-10-07 DIAGNOSIS — I129 Hypertensive chronic kidney disease with stage 1 through stage 4 chronic kidney disease, or unspecified chronic kidney disease: Secondary | ICD-10-CM | POA: Diagnosis not present

## 2020-10-07 DIAGNOSIS — Z8701 Personal history of pneumonia (recurrent): Secondary | ICD-10-CM | POA: Diagnosis present

## 2020-10-07 DIAGNOSIS — N1831 Chronic kidney disease, stage 3a: Secondary | ICD-10-CM | POA: Diagnosis not present

## 2020-10-07 DIAGNOSIS — R600 Localized edema: Secondary | ICD-10-CM | POA: Diagnosis not present

## 2020-10-07 DIAGNOSIS — Z944 Liver transplant status: Secondary | ICD-10-CM | POA: Diagnosis not present

## 2020-10-07 DIAGNOSIS — E782 Mixed hyperlipidemia: Secondary | ICD-10-CM | POA: Diagnosis not present

## 2020-10-07 DIAGNOSIS — I517 Cardiomegaly: Secondary | ICD-10-CM | POA: Diagnosis not present

## 2020-10-07 DIAGNOSIS — Z794 Long term (current) use of insulin: Secondary | ICD-10-CM | POA: Diagnosis not present

## 2020-10-08 DIAGNOSIS — L089 Local infection of the skin and subcutaneous tissue, unspecified: Secondary | ICD-10-CM | POA: Diagnosis not present

## 2020-10-08 DIAGNOSIS — H16212 Exposure keratoconjunctivitis, left eye: Secondary | ICD-10-CM | POA: Diagnosis not present

## 2020-10-08 DIAGNOSIS — C441292 Squamous cell carcinoma of skin of left lower eyelid, including canthus: Secondary | ICD-10-CM | POA: Diagnosis not present

## 2020-10-14 ENCOUNTER — Other Ambulatory Visit (HOSPITAL_COMMUNITY): Payer: Self-pay | Admitting: Adult Health Nurse Practitioner

## 2020-10-14 DIAGNOSIS — Z8701 Personal history of pneumonia (recurrent): Secondary | ICD-10-CM

## 2020-10-19 DIAGNOSIS — R059 Cough, unspecified: Secondary | ICD-10-CM | POA: Diagnosis not present

## 2020-10-19 DIAGNOSIS — J069 Acute upper respiratory infection, unspecified: Secondary | ICD-10-CM | POA: Diagnosis not present

## 2020-10-22 DIAGNOSIS — C441292 Squamous cell carcinoma of skin of left lower eyelid, including canthus: Secondary | ICD-10-CM | POA: Diagnosis not present

## 2020-10-27 ENCOUNTER — Other Ambulatory Visit: Payer: Self-pay

## 2020-10-27 ENCOUNTER — Ambulatory Visit: Payer: Medicare Other | Admitting: Internal Medicine

## 2020-10-27 ENCOUNTER — Encounter: Payer: Self-pay | Admitting: Internal Medicine

## 2020-10-27 VITALS — BP 146/77 | HR 65 | Temp 97.7°F | Ht 66.0 in | Wt 164.6 lb

## 2020-10-27 DIAGNOSIS — L570 Actinic keratosis: Secondary | ICD-10-CM | POA: Diagnosis not present

## 2020-10-27 DIAGNOSIS — Z85828 Personal history of other malignant neoplasm of skin: Secondary | ICD-10-CM | POA: Diagnosis not present

## 2020-10-27 DIAGNOSIS — Z944 Liver transplant status: Secondary | ICD-10-CM | POA: Diagnosis not present

## 2020-10-27 DIAGNOSIS — L905 Scar conditions and fibrosis of skin: Secondary | ICD-10-CM | POA: Diagnosis not present

## 2020-10-27 DIAGNOSIS — C44329 Squamous cell carcinoma of skin of other parts of face: Secondary | ICD-10-CM | POA: Diagnosis not present

## 2020-10-27 DIAGNOSIS — D485 Neoplasm of uncertain behavior of skin: Secondary | ICD-10-CM | POA: Diagnosis not present

## 2020-10-27 DIAGNOSIS — C44629 Squamous cell carcinoma of skin of left upper limb, including shoulder: Secondary | ICD-10-CM | POA: Diagnosis not present

## 2020-10-27 DIAGNOSIS — C44622 Squamous cell carcinoma of skin of right upper limb, including shoulder: Secondary | ICD-10-CM | POA: Diagnosis not present

## 2020-10-27 DIAGNOSIS — L821 Other seborrheic keratosis: Secondary | ICD-10-CM | POA: Diagnosis not present

## 2020-10-27 NOTE — Patient Instructions (Signed)
It was good seeing you today as always!  I do not recommend any change in your medications.  I do recommend you keep your follow-up appointments with your other treating physicians.  You definitely should use sunscreen and limit sun exposure when you are at the beach or outside  In particular, you need to establish care with a kidney specialist.  I would rely on [facility] to help you in that regard.  I will plan to see you back in 6 months and as needed.

## 2020-10-27 NOTE — Progress Notes (Signed)
Primary Care Physician:  Celene Squibb, MD Primary Gastroenterologist:  Dr. Gala Romney  Pre-Procedure History & Physical: HPI:  [name] is a 75 y.o. male here for f/u - status post orthotopic liver transplantation for NASH about 8 years ago.  History of chronic kidney disease stage IIIb and CHF.He is followed regularly by multiple specialist down at [facility]. He has had some bumps in the road this year including hospitalization for pneumonia  as well as shingles and melanoma removed from his left eyelid. He has switched primary care practices to follow his NP, Jannette Fogo,.  He now is primarily seen with a [facility] in [facility].  He tells me he is looking for a new nephrologist. 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  He sold his motorcycle.  No more skydiving.  He likes to go to the beach. Most recent labs (4/21 -hemoglobin 11.9, platelets 155, creatinine 2.23.  Aminotransferases normal.  Total bilirubin 1.2.   Past Medical History:  Diagnosis Date   Anemia    thrombocytopenia   Atherosclerosis    BPH (benign prostatic hyperplasia)    Cardiomegaly    Cirrhosis of liver ([place])    PT HAS HAD A LIVER TRANSPLANT-NASH, vaccinated for HepA/B on 05/31/11 afp 1.6, thrombus in splenic vein per ct   Coronary artery disease 2004   s/p CAGB   DM (diabetes mellitus) ([place])    Erectile dysfunction    Esophageal varices ([place])     EGD 05/13/10, three columns grade II to III EV s/p banding, second banding since 01/2010.>Portal gastropathy.. >5 banding sessions in the past.   GERD (gastroesophageal reflux disease)    Gynecomastia, male    RIGHT   History of transfusion of whole blood    Hyperlipidemia    Hypertension    Hypothyroidism    Overactive bladder    Spleen enlarged    Thrombocytopenia ([facility])    Thrombus    Chronic splenic, portal and smv    Past Surgical History:  Procedure Laterality Date    CARDIAC CATHETERIZATION  10/09/06   CATARACT EXTRACTION W/PHACO Left 04/20/2015   Procedure: CATARACT EXTRACTION PHACO AND INTRAOCULAR LENS PLACEMENT LEFT EYE CDE=14.92;  Surgeon: Tonny Branch, MD;  Location: [facility];  Service: Ophthalmology;  Laterality: Left;   COLONOSCOPY  01/2010   portal colopathy, sigmoid AVM (nonbleeding)   COLONOSCOPY WITH PROPOFOL N/A 12/21/2017   Dr. Gala Romney: sigmoid diverticulosis, otherwise normal   CORONARY ARTERY BYPASS GRAFT  2004   3 vessels   ESOPHAGOGASTRODUODENOSCOPY  01/2010   4 COLUMNS 2-3 GRADE ESOPHAGEAL VARICES,S/P BANDING,PORTAL GASTROPATHY   ESOPHAGOGASTRODUODENOSCOPY  June 2012   Dr. Asuncion Tapscott-->3 columns of grade 1 esophageal varices with overlying scar, no banding treatment required, portal gastropathy. Next EGD in December 2013   ESOPHAGOGASTRODUODENOSCOPY N/A 04/16/2012   Dr. Gala Romney: 3 columns Grade 2-3 esophageal varices, portal gastropathy, gastric antral vascular ectasia   LIVER TRANSPLANT  01/2013   ROTATOR CUFF REPAIR Left     Prior to Admission medications   Medication Sig Start Date End Date Taking? Authorizing Provider  aspirin 81 MG tablet Take 81 mg by mouth daily.   Yes [provider]  atorvastatin (LIPITOR) 20 MG tablet Take 20 mg by mouth at bedtime.   Yes [provider]  Calcium Carbonate-Vitamin D (CALCIUM 600+D PO) Take 1 tablet by mouth daily.   Yes [provider]  carvedilol (COREG) 6.25 MG tablet Take 6.25 mg  by mouth at bedtime.    Yes [provider]  cycloSPORINE modified (NEORAL) 100 MG capsule Take 200-300 mg by mouth See admin instructions. Take '300mg'$  by mouth in the morning and 200 mg in the evening   Yes [provider]  levothyroxine (SYNTHROID, LEVOTHROID) 125 MCG tablet Take 125 mcg by mouth daily before breakfast.   Yes [provider]  Magnesium Oxide (MAGOX 400 PO) Take 1,200 mg by mouth daily.    Yes [provider]  mycophenolate (CELLCEPT) 250 MG  capsule Take 250 mg by mouth 2 (two) times daily.    Yes [provider]  sitaGLIPtin (JANUVIA) 50 MG tablet Take 1 tablet (50 mg total) by mouth daily. 06/25/20 10/27/20 Yes Donne Hazel, MD  benzonatate (TESSALON PERLES) 100 MG capsule Take 1 capsule (100 mg total) by mouth 3 (three) times daily as needed for cough. Patient not taking: Reported on 10/27/2020 06/25/20 06/25/21  Donne Hazel, MD  erythromycin ophthalmic ointment Place a 1/2 inch ribbon of ointment into/onto the lower eyelid. Patient not taking: No sig reported 06/26/19   Wurst, [place], PA-C  neomycin-polymyxin-dexameth (MAXITROL) 0.1 % OINT Place 1 application into the left eye in the morning and at bedtime. Patient not taking: Reported on 10/27/2020    [provider]    Allergies as of 10/27/2020   (No Known Allergies)    Family History  Problem Relation Age of Onset   Stroke Father 65   Heart disease Mother 29       CHF   Heart attack Brother    Liver disease Neg Hx    Colon cancer Neg Hx     Social History   Socioeconomic History   Marital status: Married    Spouse name: Not on file   Number of children: 4   Years of education: Not on file   Highest education level: Not on file  Occupational History    Employer: [employer]  Tobacco Use   Smoking status: Former    Packs/day: 2.00    Years: 20.00    Pack years: 40.00    Types: Cigarettes    Quit date: 04/14/1974    Years since quitting: 46.5   Smokeless tobacco: Never   Tobacco comments:    Quit smoking at age 54  Vaping Use   Vaping Use: Never used  Substance and Sexual Activity   Alcohol use: No   Drug use: No   Sexual activity: Not Currently  Other Topics Concern   Not on file  Social History Narrative   Not on file   Social Determinants of Health   Financial Resource Strain: Not on file  Food Insecurity: Not on file  Transportation Needs: Not on file  Physical Activity: Not on file  Stress: Not on  file  Social Connections: Not on file  Intimate Partner Violence: Not on file    Review of Systems: See HPI, otherwise negative ROS  Physical Exam: BP (!) 146/77   Pulse 65   Temp 97.7 F (36.5 C) (Temporal)   Ht '5\' 6"'$  (1.676 m)   Wt 164 lb 9.6 oz (74.7 kg)   BMI 26.57 kg/m  General:   Alert, somewhat elderly appearing pleasant and cooperative in NAD Mouth:  No deformity or lesions. Multiple tattoos.  No jaundice. Neck:  Supple; no masses or thyromegaly. No significant cervical adenopathy. Lungs:  Clear throughout to auscultation.   No wheezes, crackles, or rhonchi. No acute distress. Heart:  Regular rate and rhythm; no murmurs, clicks, rubs,  or gallops. Abdomen: Non-distended, normal bowel sounds.  Soft and nontender without appreciable mass or hepatosplenomegaly.   Impression/Plan: Very pleasant 75 year old gentleman  - status post orthotopic liver transplantation 8 years ago for NASH.  He has done well from a transplant standpoint;  his CHF and chronic kidney disease appear to be stable. He has had an number of episodic health issues but this year.  Not mentioned above, he did not have COVID at any time as far as he knows with multiple negative tests. Clinically, looks at his baseline today. He has multiple specialist to keep up with.  I encouraged him to make all of his appointments.  He remains motivated.  Recommendations:  I do not recommend any change in medications.  I do recommend keeping follow-up appointments with other treating physicians.  Patient should use sunscreen and limit sun exposure   In particular, continue follow-up with a kidney specialist.  I would rely on [facility] to help in that regard.  Follow-up here in 6 months and as needed   Notice: This dictation was prepared with Dragon dictation along with smaller phrase technology. Any transcriptional errors that result from this process are unintentional and may not be corrected upon review.

## 2020-11-02 DIAGNOSIS — Z944 Liver transplant status: Secondary | ICD-10-CM | POA: Diagnosis not present

## 2020-11-02 DIAGNOSIS — D899 Disorder involving the immune mechanism, unspecified: Secondary | ICD-10-CM | POA: Diagnosis not present

## 2020-11-05 DIAGNOSIS — I361 Nonrheumatic tricuspid (valve) insufficiency: Secondary | ICD-10-CM | POA: Diagnosis not present

## 2020-11-05 DIAGNOSIS — I10 Essential (primary) hypertension: Secondary | ICD-10-CM | POA: Diagnosis not present

## 2020-11-05 DIAGNOSIS — I493 Ventricular premature depolarization: Secondary | ICD-10-CM | POA: Diagnosis not present

## 2020-11-05 DIAGNOSIS — Z951 Presence of aortocoronary bypass graft: Secondary | ICD-10-CM | POA: Diagnosis not present

## 2020-11-05 DIAGNOSIS — I5042 Chronic combined systolic (congestive) and diastolic (congestive) heart failure: Secondary | ICD-10-CM | POA: Diagnosis not present

## 2020-11-05 DIAGNOSIS — I2510 Atherosclerotic heart disease of native coronary artery without angina pectoris: Secondary | ICD-10-CM | POA: Diagnosis not present

## 2020-11-05 DIAGNOSIS — I5022 Chronic systolic (congestive) heart failure: Secondary | ICD-10-CM | POA: Diagnosis not present

## 2020-11-05 DIAGNOSIS — I340 Nonrheumatic mitral (valve) insufficiency: Secondary | ICD-10-CM | POA: Diagnosis not present

## 2020-11-27 DIAGNOSIS — Z944 Liver transplant status: Secondary | ICD-10-CM | POA: Diagnosis not present

## 2020-11-27 DIAGNOSIS — D899 Disorder involving the immune mechanism, unspecified: Secondary | ICD-10-CM | POA: Diagnosis not present

## 2020-12-08 DIAGNOSIS — C44329 Squamous cell carcinoma of skin of other parts of face: Secondary | ICD-10-CM | POA: Diagnosis not present

## 2020-12-08 DIAGNOSIS — D485 Neoplasm of uncertain behavior of skin: Secondary | ICD-10-CM | POA: Diagnosis not present

## 2020-12-08 DIAGNOSIS — C44629 Squamous cell carcinoma of skin of left upper limb, including shoulder: Secondary | ICD-10-CM | POA: Diagnosis not present

## 2020-12-23 DIAGNOSIS — R059 Cough, unspecified: Secondary | ICD-10-CM | POA: Diagnosis not present

## 2020-12-23 DIAGNOSIS — J069 Acute upper respiratory infection, unspecified: Secondary | ICD-10-CM | POA: Diagnosis not present

## 2020-12-23 DIAGNOSIS — J9811 Atelectasis: Secondary | ICD-10-CM | POA: Diagnosis not present

## 2020-12-24 DIAGNOSIS — C44529 Squamous cell carcinoma of skin of other part of trunk: Secondary | ICD-10-CM | POA: Diagnosis not present

## 2020-12-31 DIAGNOSIS — H16212 Exposure keratoconjunctivitis, left eye: Secondary | ICD-10-CM | POA: Diagnosis not present

## 2020-12-31 DIAGNOSIS — C441292 Squamous cell carcinoma of skin of left lower eyelid, including canthus: Secondary | ICD-10-CM | POA: Diagnosis not present

## 2020-12-31 DIAGNOSIS — H019 Unspecified inflammation of eyelid: Secondary | ICD-10-CM | POA: Diagnosis not present

## 2021-01-05 DIAGNOSIS — C44629 Squamous cell carcinoma of skin of left upper limb, including shoulder: Secondary | ICD-10-CM | POA: Diagnosis not present

## 2021-01-05 DIAGNOSIS — C44329 Squamous cell carcinoma of skin of other parts of face: Secondary | ICD-10-CM | POA: Diagnosis not present

## 2021-01-05 DIAGNOSIS — C44622 Squamous cell carcinoma of skin of right upper limb, including shoulder: Secondary | ICD-10-CM | POA: Diagnosis not present

## 2021-01-19 DIAGNOSIS — C44529 Squamous cell carcinoma of skin of other part of trunk: Secondary | ICD-10-CM | POA: Diagnosis not present

## 2021-01-19 DIAGNOSIS — C44629 Squamous cell carcinoma of skin of left upper limb, including shoulder: Secondary | ICD-10-CM | POA: Diagnosis not present

## 2021-01-19 DIAGNOSIS — D485 Neoplasm of uncertain behavior of skin: Secondary | ICD-10-CM | POA: Diagnosis not present

## 2021-01-19 DIAGNOSIS — D0461 Carcinoma in situ of skin of right upper limb, including shoulder: Secondary | ICD-10-CM | POA: Diagnosis not present

## 2021-02-10 DIAGNOSIS — N1832 Chronic kidney disease, stage 3b: Secondary | ICD-10-CM | POA: Diagnosis not present

## 2021-02-10 DIAGNOSIS — E079 Disorder of thyroid, unspecified: Secondary | ICD-10-CM | POA: Diagnosis not present

## 2021-02-10 DIAGNOSIS — M109 Gout, unspecified: Secondary | ICD-10-CM | POA: Diagnosis not present

## 2021-02-10 DIAGNOSIS — Z0000 Encounter for general adult medical examination without abnormal findings: Secondary | ICD-10-CM | POA: Diagnosis not present

## 2021-02-10 DIAGNOSIS — E782 Mixed hyperlipidemia: Secondary | ICD-10-CM | POA: Diagnosis not present

## 2021-02-10 DIAGNOSIS — D849 Immunodeficiency, unspecified: Secondary | ICD-10-CM | POA: Diagnosis not present

## 2021-02-10 DIAGNOSIS — E1122 Type 2 diabetes mellitus with diabetic chronic kidney disease: Secondary | ICD-10-CM | POA: Diagnosis not present

## 2021-02-10 DIAGNOSIS — N183 Chronic kidney disease, stage 3 unspecified: Secondary | ICD-10-CM | POA: Diagnosis not present

## 2021-02-10 DIAGNOSIS — I129 Hypertensive chronic kidney disease with stage 1 through stage 4 chronic kidney disease, or unspecified chronic kidney disease: Secondary | ICD-10-CM | POA: Diagnosis not present

## 2021-03-08 DIAGNOSIS — Z85828 Personal history of other malignant neoplasm of skin: Secondary | ICD-10-CM | POA: Diagnosis not present

## 2021-03-08 DIAGNOSIS — Z08 Encounter for follow-up examination after completed treatment for malignant neoplasm: Secondary | ICD-10-CM | POA: Diagnosis not present

## 2021-03-08 DIAGNOSIS — D0362 Melanoma in situ of left upper limb, including shoulder: Secondary | ICD-10-CM | POA: Diagnosis not present

## 2021-03-08 DIAGNOSIS — R229 Localized swelling, mass and lump, unspecified: Secondary | ICD-10-CM | POA: Diagnosis not present

## 2021-03-08 DIAGNOSIS — L578 Other skin changes due to chronic exposure to nonionizing radiation: Secondary | ICD-10-CM | POA: Diagnosis not present

## 2021-03-08 DIAGNOSIS — C44329 Squamous cell carcinoma of skin of other parts of face: Secondary | ICD-10-CM | POA: Diagnosis not present

## 2021-03-08 DIAGNOSIS — C44321 Squamous cell carcinoma of skin of nose: Secondary | ICD-10-CM | POA: Diagnosis not present

## 2021-03-08 DIAGNOSIS — L570 Actinic keratosis: Secondary | ICD-10-CM | POA: Diagnosis not present

## 2021-03-08 DIAGNOSIS — D485 Neoplasm of uncertain behavior of skin: Secondary | ICD-10-CM | POA: Diagnosis not present

## 2021-03-19 DIAGNOSIS — C44629 Squamous cell carcinoma of skin of left upper limb, including shoulder: Secondary | ICD-10-CM | POA: Diagnosis not present

## 2021-04-02 DIAGNOSIS — C44529 Squamous cell carcinoma of skin of other part of trunk: Secondary | ICD-10-CM | POA: Diagnosis not present

## 2021-04-02 DIAGNOSIS — L905 Scar conditions and fibrosis of skin: Secondary | ICD-10-CM | POA: Diagnosis not present

## 2021-04-02 DIAGNOSIS — D485 Neoplasm of uncertain behavior of skin: Secondary | ICD-10-CM | POA: Diagnosis not present

## 2021-04-02 DIAGNOSIS — C44329 Squamous cell carcinoma of skin of other parts of face: Secondary | ICD-10-CM | POA: Diagnosis not present

## 2021-04-02 DIAGNOSIS — C4362 Malignant melanoma of left upper limb, including shoulder: Secondary | ICD-10-CM | POA: Diagnosis not present

## 2021-05-10 ENCOUNTER — Encounter: Payer: Self-pay | Admitting: Internal Medicine

## 2022-05-04 IMAGING — DX DG CHEST 2V
2 series · 2 of 2 positions shown · non-contrast
Comparison: 06/23/2020.  04/06/2014.  CT 04/07/2014.

CLINICAL DATA: History of pneumonia.

EXAM:
CHEST - 2 VIEW

[chest pa]
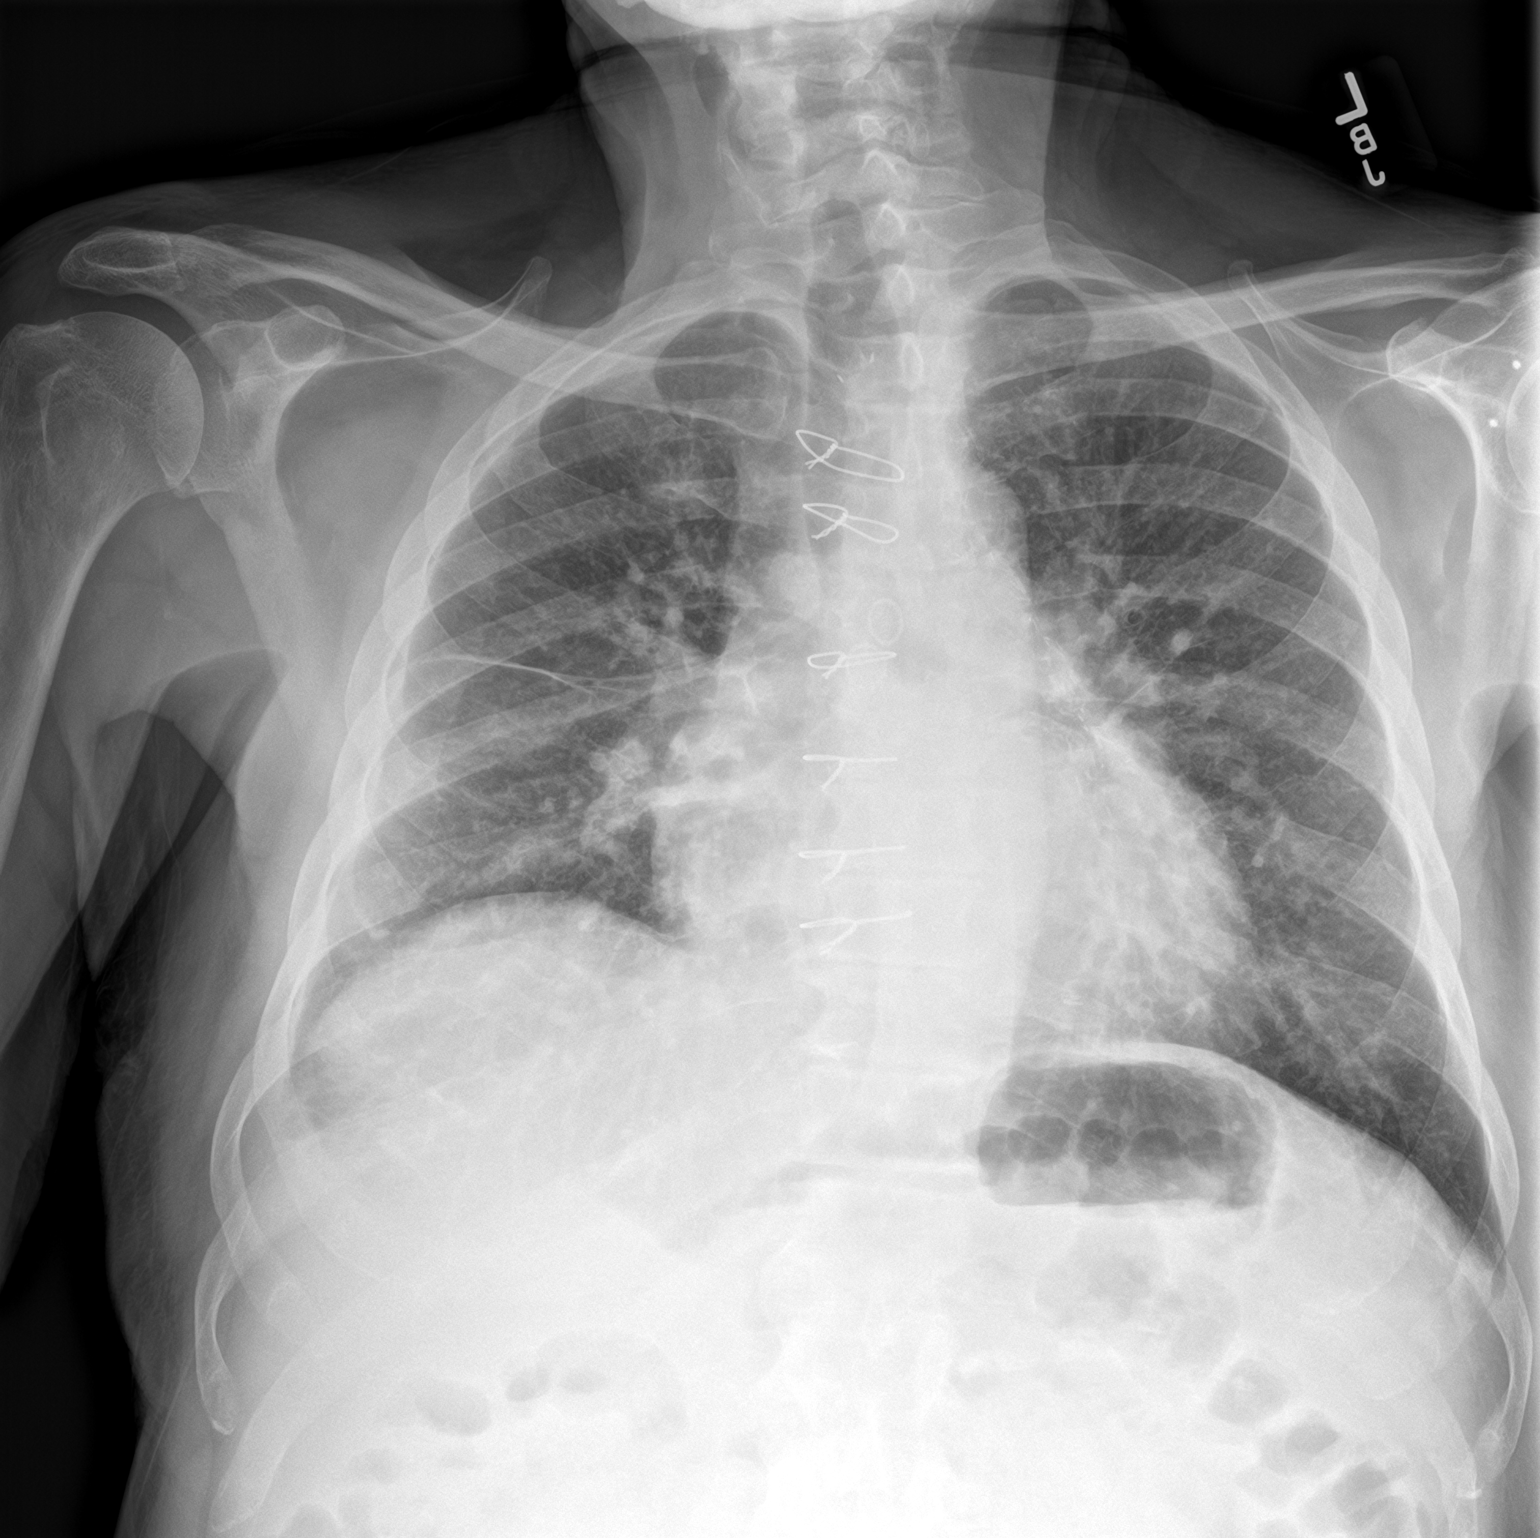

[chest lat]
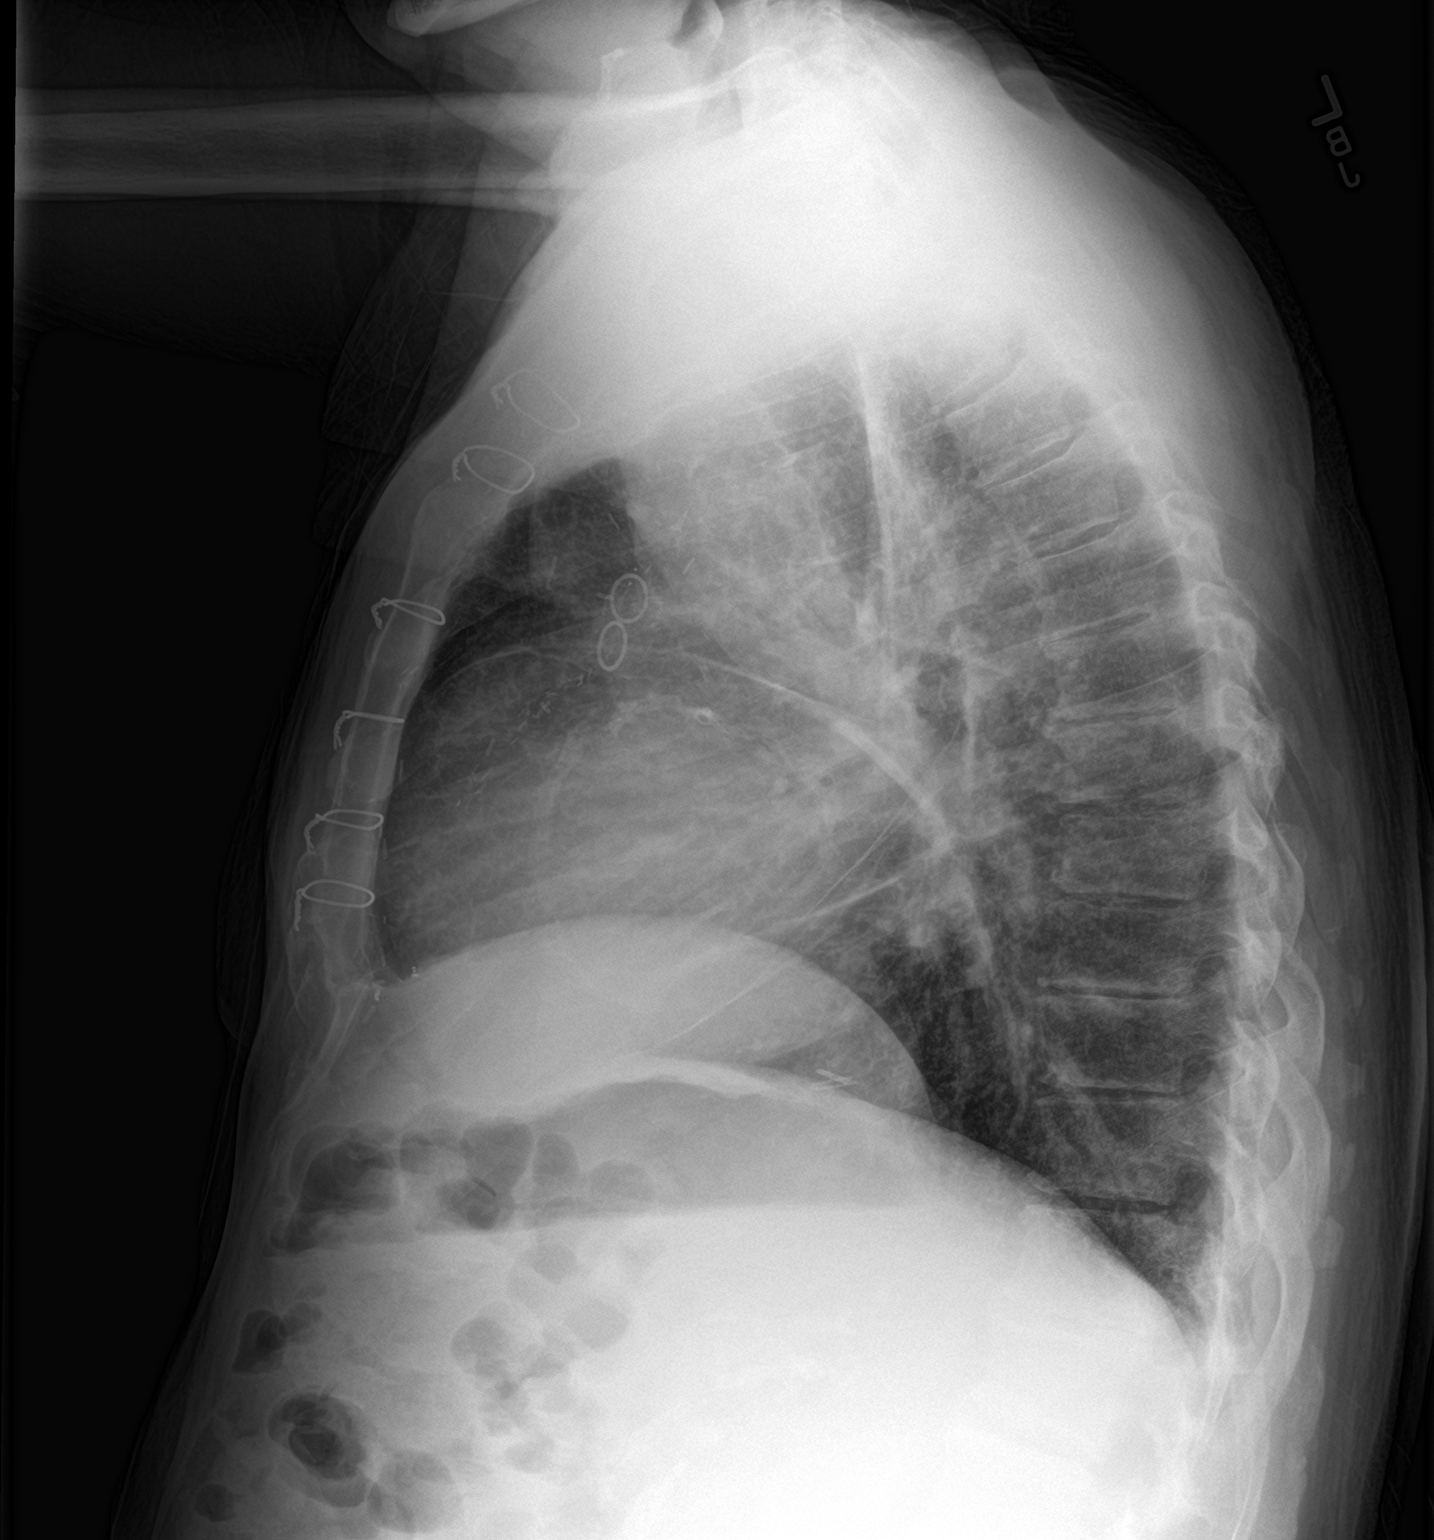

[2 of 2 positions shown; findings below may reference images not displayed]

FINDINGS: Mediastinum hilar structures normal. Prior CABG. Stable
cardiomegaly. Low lung volumes with mild bibasilar atelectasis again
noted. No focal infiltrate noted on today's exam. No pleural
effusion or pneumothorax. No acute bony abnormality.
IMPRESSION: 1.  Prior CABG.  Stable cardiomegaly.

2. Low lung volumes with bibasilar atelectasis and or scarring again
noted. No focal infiltrate noted on today's exam.

## 2022-06-14 ENCOUNTER — Telehealth: Payer: Self-pay | Admitting: Physician Assistant

## 2022-06-14 NOTE — Telephone Encounter (Signed)
scheduled per 4/8 referral, pt has been called and confirmed date and time. Pt is aware of location and to arrive early for check in   

## 2022-06-15 ENCOUNTER — Other Ambulatory Visit: Payer: Self-pay

## 2022-06-15 ENCOUNTER — Inpatient Hospital Stay: Payer: Medicare Other

## 2022-06-15 ENCOUNTER — Inpatient Hospital Stay: Payer: Medicare Other | Attending: Physician Assistant | Admitting: Physician Assistant

## 2022-06-15 ENCOUNTER — Ambulatory Visit
Admission: RE | Admit: 2022-06-15 | Discharge: 2022-06-15 | Disposition: A | Discharge status: Home / Self Care | Payer: Self-pay | Source: Ambulatory Visit | Attending: Physician Assistant | Admitting: Physician Assistant

## 2022-06-15 VITALS — BP 131/66 | HR 59 | Temp 98.1°F | Resp 18 | Ht 66.0 in | Wt 167.0 lb

## 2022-06-15 DIAGNOSIS — N400 Enlarged prostate without lower urinary tract symptoms: Secondary | ICD-10-CM | POA: Diagnosis not present

## 2022-06-15 DIAGNOSIS — K7581 Nonalcoholic steatohepatitis (NASH): Secondary | ICD-10-CM

## 2022-06-15 DIAGNOSIS — Z944 Liver transplant status: Secondary | ICD-10-CM | POA: Insufficient documentation

## 2022-06-15 DIAGNOSIS — R161 Splenomegaly, not elsewhere classified: Secondary | ICD-10-CM | POA: Diagnosis not present

## 2022-06-15 DIAGNOSIS — R591 Generalized enlarged lymph nodes: Secondary | ICD-10-CM

## 2022-06-15 DIAGNOSIS — R590 Localized enlarged lymph nodes: Secondary | ICD-10-CM

## 2022-06-15 DIAGNOSIS — Z87891 Personal history of nicotine dependence: Secondary | ICD-10-CM

## 2022-06-15 DIAGNOSIS — I7090 Unspecified atherosclerosis: Secondary | ICD-10-CM | POA: Diagnosis not present

## 2022-06-15 DIAGNOSIS — E119 Type 2 diabetes mellitus without complications: Secondary | ICD-10-CM

## 2022-06-15 DIAGNOSIS — R918 Other nonspecific abnormal finding of lung field: Secondary | ICD-10-CM | POA: Insufficient documentation

## 2022-06-15 DIAGNOSIS — R112 Nausea with vomiting, unspecified: Secondary | ICD-10-CM | POA: Diagnosis not present

## 2022-06-15 DIAGNOSIS — M79622 Pain in left upper arm: Secondary | ICD-10-CM

## 2022-06-15 DIAGNOSIS — I10 Essential (primary) hypertension: Secondary | ICD-10-CM | POA: Diagnosis not present

## 2022-06-15 LAB — CBC WITH DIFFERENTIAL (CANCER CENTER ONLY)
Abs Immature Granulocytes: 0.02 10*3/uL (ref 0.00–0.07)
Basophils Absolute: 0 10*3/uL (ref 0.0–0.1)
Basophils Relative: 1 %
Eosinophils Absolute: 0.3 10*3/uL (ref 0.0–0.5)
Eosinophils Relative: 4 %
HCT: 38.8 % — ABNORMAL LOW (ref 39.0–52.0)
Hemoglobin: 13.3 g/dL (ref 13.0–17.0)
Immature Granulocytes: 0 %
Lymphocytes Relative: 28 %
Lymphs Abs: 1.8 10*3/uL (ref 0.7–4.0)
MCH: 31.1 pg (ref 26.0–34.0)
MCHC: 34.3 g/dL (ref 30.0–36.0)
MCV: 90.9 fL (ref 80.0–100.0)
Monocytes Absolute: 0.5 10*3/uL (ref 0.1–1.0)
Monocytes Relative: 8 %
Neutro Abs: 3.7 10*3/uL (ref 1.7–7.7)
Neutrophils Relative %: 59 %
Platelet Count: 222 10*3/uL (ref 150–400)
RBC: 4.27 MIL/uL (ref 4.22–5.81)
RDW: 13.3 % (ref 11.5–15.5)
WBC Count: 6.3 10*3/uL (ref 4.0–10.5)
nRBC: 0 % (ref 0.0–0.2)

## 2022-06-15 LAB — CMP (CANCER CENTER ONLY)
ALT: 12 U/L (ref 0–44)
AST: 17 U/L (ref 15–41)
Albumin: 3.7 g/dL (ref 3.5–5.0)
Alkaline Phosphatase: 70 U/L (ref 38–126)
Anion gap: 7 (ref 5–15)
BUN: 35 mg/dL — ABNORMAL HIGH (ref 8–23)
CO2: 28 mmol/L (ref 22–32)
Calcium: 9.8 mg/dL (ref 8.9–10.3)
Chloride: 99 mmol/L (ref 98–111)
Creatinine: 1.93 mg/dL — ABNORMAL HIGH (ref 0.61–1.24)
GFR, Estimated: 35 mL/min — ABNORMAL LOW (ref >60)
Glucose, Bld: 153 mg/dL — ABNORMAL HIGH (ref 70–99)
Potassium: 4.6 mmol/L (ref 3.5–5.1)
Sodium: 134 mmol/L — ABNORMAL LOW (ref 135–145)
Total Bilirubin: 1.8 mg/dL — ABNORMAL HIGH (ref 0.3–1.2)
Total Protein: 7.8 g/dL (ref 6.5–8.1)

## 2022-06-15 LAB — C-REACTIVE PROTEIN: CRP: 2.4 mg/dL — ABNORMAL HIGH (ref <1.0)

## 2022-06-15 LAB — LACTATE DEHYDROGENASE: LDH: 142 U/L (ref 98–192)

## 2022-06-15 LAB — SEDIMENTATION RATE: Sed Rate: 50 mm/hr — ABNORMAL HIGH (ref 0–16)

## 2022-06-15 MED ORDER — OXYCODONE HCL 5 MG PO TABS: 5.0000 mg | ORAL_TABLET | ORAL | 0 refills | Status: DC | PRN

## 2022-06-15 MED ORDER — ONDANSETRON HCL 8 MG PO TABS: 8.0000 mg | ORAL_TABLET | Freq: Three times a day (TID) | ORAL | 0 refills | Status: AC | PRN

## 2022-06-16 ENCOUNTER — Encounter: Payer: Self-pay | Admitting: Physician Assistant

## 2022-06-16 DIAGNOSIS — R591 Generalized enlarged lymph nodes: Secondary | ICD-10-CM

## 2022-06-16 DIAGNOSIS — C4492 Squamous cell carcinoma of skin, unspecified: Secondary | ICD-10-CM

## 2022-06-16 NOTE — Progress Notes (Unsigned)
Nathan Dance, MD  [name] OK for [place] CORE BX LEFT AX LARGE NODAL MASS  SEE OUTSIDE CT FROM [facility] LOADED IN PACS  TS

## 2022-06-16 NOTE — Progress Notes (Signed)
[facility] [phone]   [phone]  INITIAL CONSULTATION:  Patient Care Team: Roe Rutherford, NP as PCP - General (Adult Health Nurse Practitioner) Corbin Ade, MD (Gastroenterology) Cornelia Copa, MD as Referring Physician (Internal Medicine)  CHIEF COMPLAINTS/PURPOSE OF CONSULTATION:  Left axillary lymphadenopathy   HISTORY OF PRESENTING ILLNESS:  [name] 77 y.o. male with medical history significant for NASH s/p liver transplant with ongoing immunosuppressive drug therapy, GERD, hyperlipidemia, hypertension, hypothyroidism, and BPH presents to the diagnostic clinic for evaluation of left axillary lymphadenopathy. Patient is accompanied by his wife for this visit.  On review of the previous records, [name] underwent CT imaging on 06/13/2022 due to palpable left axillary mass for 1-2 weeks. Findings revealed a heterogenous left axillary mass measuring 7.2 x 4.6 x 6.0 cm.   On exam today, [name] reports he has felt the palpable left axillary mass for the last two weeks without any change in size. He reports the mass is tender to palpation but denies any redness or discharge. He adds that he has noticed worsening fatigue over the last several months which can interfere with his ADLs. He denies any appetite or weight changes. He does have nausea mainly in the morning with occasional episodes of vomiting. He denies abdominal pain and bowel habits are regular. He denies easy bruising or signs of active bleeding. He denies fevers, chills, sweats, shortness of breath, chest pain or cough. He has no other complaints. Rest of the 10 point ROS is below.   MEDICAL HISTORY:  Past Medical History:  Diagnosis Date   Anemia    thrombocytopenia   Atherosclerosis    BPH (benign prostatic hyperplasia)    Cardiomegaly    Cirrhosis of liver    PT HAS HAD A LIVER TRANSPLANT-NASH, vaccinated for HepA/B on 05/31/11 afp 1.6,  thrombus in splenic vein per ct   Coronary artery disease 2004   s/p CAGB   DM (diabetes mellitus)    Erectile dysfunction    Esophageal varices     EGD 05/13/10, three columns grade II to III EV s/p banding, second banding since 01/2010.>Portal gastropathy.. >5 banding sessions in the past.   GERD (gastroesophageal reflux disease)    Gynecomastia, male    RIGHT   History of transfusion of whole blood    Hyperlipidemia    Hypertension    Hypothyroidism    Overactive bladder    Spleen enlarged    Thrombocytopenia    Thrombus    Chronic splenic, portal and smv    SURGICAL HISTORY: Past Surgical History:  Procedure Laterality Date   CARDIAC CATHETERIZATION  10/09/06   CATARACT EXTRACTION W/PHACO Left 04/20/2015   Procedure: CATARACT EXTRACTION PHACO AND INTRAOCULAR LENS PLACEMENT LEFT EYE CDE=14.92;  Surgeon: Gemma Payor, MD;  Location: [facility];  Service: Ophthalmology;  Laterality: Left;   COLONOSCOPY  01/2010   portal colopathy, sigmoid AVM (nonbleeding)   COLONOSCOPY WITH PROPOFOL N/A 12/21/2017   Dr. Jena Gauss: sigmoid diverticulosis, otherwise normal   CORONARY ARTERY BYPASS GRAFT  2004   3 vessels   ESOPHAGOGASTRODUODENOSCOPY  01/2010   4 COLUMNS 2-3 GRADE ESOPHAGEAL VARICES,S/P BANDING,PORTAL GASTROPATHY   ESOPHAGOGASTRODUODENOSCOPY  June 2012   Dr. Rourk-->3 columns of grade 1 esophageal varices with overlying scar, no banding treatment required, portal gastropathy. Next EGD in December 2013   ESOPHAGOGASTRODUODENOSCOPY N/A 04/16/2012   Dr. Jena Gauss: 3 columns Grade 2-3 esophageal varices, portal gastropathy, gastric antral vascular ectasia   LIVER TRANSPLANT  01/2013   ROTATOR CUFF REPAIR Left     SOCIAL HISTORY: Social History   Socioeconomic History   Marital status: Married    Spouse name: Not on file   Number of children: 4   Years of education: Not on file   Highest education level: Not on file  Occupational History    Employer: [employer]   Tobacco Use   Smoking status: Former    Packs/day: 2.00    Years: 20.00    Additional pack years: 0.00    Total pack years: 40.00    Types: Cigarettes    Quit date: 04/14/1974    Years since quitting: 48.2   Smokeless tobacco: Never   Tobacco comments:    Quit smoking at age 54  Vaping Use   Vaping Use: Never used  Substance and Sexual Activity   Alcohol use: No   Drug use: No   Sexual activity: Not Currently  Other Topics Concern   Not on file  Social History Narrative   Not on file   Social Determinants of Health   Financial Resource Strain: Not on file  Food Insecurity: No Food Insecurity (06/15/2022)   Hunger Vital Sign    Worried About Running Out of Food in the Last Year: Never true    Ran Out of Food in the Last Year: Never true  Transportation Needs: No Transportation Needs (06/15/2022)   PRAPARE - Administrator, Civil Service (Medical): No    Lack of Transportation (Non-Medical): No  Physical Activity: Not on file  Stress: Not on file  Social Connections: Not on file  Intimate Partner Violence: Not At Risk (06/15/2022)   Humiliation, Afraid, Rape, and Kick questionnaire    Fear of Current or Ex-Partner: No    Emotionally Abused: No    Physically Abused: No    Sexually Abused: No    FAMILY HISTORY: Family History  Problem Relation Age of Onset   Stroke Father 35   Heart disease Mother 75       CHF   Heart attack Brother    Liver disease Neg Hx    Colon cancer Neg Hx     ALLERGIES:  has No Known Allergies.  MEDICATIONS:  Current Outpatient Medications  Medication Sig Dispense Refill   aspirin 81 MG tablet Take 81 mg by mouth daily.     atorvastatin (LIPITOR) 20 MG tablet Take 20 mg by mouth at bedtime.     Calcium Carbonate-Vitamin D (CALCIUM 600+D PO) Take 1 tablet by mouth daily.     carvedilol (COREG) 6.25 MG tablet Take 6.25 mg by mouth at bedtime.      cycloSPORINE modified (NEORAL) 100 MG capsule Take 200-300 mg by mouth See admin  instructions. Take 300mg  by mouth in the morning and 200 mg in the evening     levothyroxine (SYNTHROID, LEVOTHROID) 125 MCG tablet Take 125 mcg by mouth daily before breakfast.     Magnesium Oxide (MAGOX 400 PO) Take 1,200 mg by mouth daily.      ondansetron (ZOFRAN) 8 MG tablet Take 1 tablet (8 mg total) by mouth every 8 (eight) hours as needed for nausea or vomiting. 20 tablet 0   oxyCODONE (OXY IR/ROXICODONE) 5 MG immediate release tablet Take 1 tablet (5 mg total) by mouth every 4 (four) hours as needed for severe pain. 30 tablet 0   No current facility-administered medications for this visit.    REVIEW OF SYSTEMS:   Constitutional: ( - )  fevers, ( - )  chills , ( - ) night sweats Eyes: ( - ) blurriness of vision, ( - ) double vision, ( - ) watery eyes Ears, nose, mouth, throat, and face: ( - ) mucositis, ( - ) sore throat Respiratory: ( - ) cough, ( - ) dyspnea, ( - ) wheezes Cardiovascular: ( - ) palpitation, ( - ) chest discomfort, ( - ) lower extremity swelling Gastrointestinal:  ( - ) nausea, ( - ) heartburn, ( - ) change in bowel habits Skin: ( - ) abnormal skin rashes Lymphatics: ( + ) new lymphadenopathy, ( - ) easy bruising Neurological: ( - ) numbness, ( - ) tingling, ( - ) new weaknesses Behavioral/Psych: ( - ) mood change, ( - ) new changes  All other systems were reviewed with the patient and are negative.  PHYSICAL EXAMINATION: ECOG PERFORMANCE STATUS: 1 - Symptomatic but completely ambulatory  Vitals:   06/15/22 1515  BP: 131/66  Pulse: (!) 59  Resp: 18  Temp: 98.1 F (36.7 C)  SpO2: 99%   Filed Weights   06/15/22 1515  Weight: 167 lb (75.8 kg)    GENERAL: well appearing male in NAD  SKIN: skin color, texture, turgor are normal, no rashes or significant lesions EYES: conjunctiva are pink and non-injected, sclera clear OROPHARYNX: no exudate, no erythema; lips, buccal mucosa, and tongue normal  NECK: supple, non-tender LYMPH:  Palpable left axillary  lymph node with some firmness and tenderness to palpation. No erythema or discharge noted. No palpable lymphadenopathy in the cervical or supraclavicular lymph nodes.  LUNGS: clear to auscultation and percussion with normal breathing effort HEART: regular rate & rhythm and no murmurs and no lower extremity edema ABDOMEN: soft, non-tender, non-distended, normal bowel sounds Musculoskeletal: no cyanosis of digits and no clubbing  PSYCH: alert & oriented x 3, fluent speech NEURO: no focal motor/sensory deficits  LABORATORY DATA:  I have reviewed the data as listed    Latest Ref Rng & Units 06/15/2022    4:03 PM 06/25/2020    6:03 AM 06/24/2020    6:27 AM  CBC  WBC 4.0 - 10.5 K/uL 6.3  5.4  5.7   Hemoglobin 13.0 - 17.0 g/dL 62.8  31.5  17.6   Hematocrit 39.0 - 52.0 % 38.8  36.6  36.0   Platelets 150 - 400 K/uL 222  155  144        Latest Ref Rng & Units 06/15/2022    4:03 PM 06/25/2020    6:03 AM 06/24/2020    6:27 AM  CMP  Glucose 70 - 99 mg/dL 160  737  64   BUN 8 - 23 mg/dL 35  56  53   Creatinine 0.61 - 1.24 mg/dL 1.06  2.69  4.85   Sodium 135 - 145 mmol/L 134  134  134   Potassium 3.5 - 5.1 mmol/L 4.6  4.1  4.1   Chloride 98 - 111 mmol/L 99  103  102   CO2 22 - 32 mmol/L 28  23  21    Calcium 8.9 - 10.3 mg/dL 9.8  8.4  8.4   Total Protein 6.5 - 8.1 g/dL 7.8  6.1  6.1   Total Bilirubin 0.3 - 1.2 mg/dL 1.8  1.2  1.8   Alkaline Phos 38 - 126 U/L 70  48  41   AST 15 - 41 U/L 17  25  20    ALT 0 - 44 U/L 12  9  10  RADIOGRAPHIC STUDIES: I have personally reviewed the radiological images as listed and agreed with the findings in the report. No results found.  Outside CT CAP WO Contrast on 06/13/2022: IMPRESSION:  1. Heterogeneous left axillary mass, suspicious for confluent lymphadenopathy, of likely malignant origin, with a differential including lymphoma and metastatic disease. This is an amenable to percutaneous biopsy.  2.  Solid pulmonary nodules within both lungs, the  largest measuring 2 mm.  3.  Severe atherosclerosis.  4.  Mild splenomegaly.    ASSESSMENT & PLAN [name] is a 77 y.o. male who presents to the diagnostic clinic for evaluation of left axillary lymphadenopathy. I discussed possible etiologies including infectious process, inflammatory process and underlying malignancy. Patient is not exhibiting any infectious symptoms at this time. I reviewed the proposed workup which includes laboratory evaluation today followed by urgent ultrasound guided core needle biopsy. Patient is in agreement with the plan and would like to proceed with the workup.    #Left axillary mass/lymphadenopathy: --Seen on CT CAP WO contrast from 06/13/2022 at [facility]. Requested images to be pushed into our system for review --Labs today to check CBC, CMP, LDH, flow cytometry, ESR, CRP, PSA levels --Requested STAT US guided biopsy of lymph node. --RTC once workup is complete.  #Nausea/Vomiting: --Sent prescription for Zofran 8 mg q 8 hours PRN.  #Left axillary pain: --Secondary to mass/lymph node --Currently taking tramadol 50 mg with minimal pain relief --Recommend to discontinue tramadol and try oxycodone 5-10 mg q 4-6 hours PRN.  #H/O Liver transplant: --Performed at [facility] in 2014 secondary to NASH. --Currently on immunosuppressive therapy.  --Continues to follow with [facility] Transplant team  Orders Placed This Encounter  Procedures   [place] CORE BIOPSY (LYMPH NODES)    He's OK for R axillary LN Bx on 4/16. Will need Coags drawn on arrival. US-guide. Local only.  JM LEFT axillary. Sorry    Standing Status:   Future    Standing Expiration Date:   06/15/2023    Order Specific Question:   Lab orders requested (DO NOT place separate lab orders, these will be automatically ordered during procedure specimen collection):    Answer:   Surgical Pathology    Order Specific Question:   Reason for Exam (SYMPTOM  OR DIAGNOSIS REQUIRED)    Answer:   large left  axillary lymph node,    Order Specific Question:   Preferred location?    Answer:   [facility]   CBC with Differential (Cancer Center Only)    Standing Status:   Future    Number of Occurrences:   1    Standing Expiration Date:   06/15/2023   CMP (Cancer Center only)    Standing Status:   Future    Number of Occurrences:   1    Standing Expiration Date:   06/15/2023   Lactate dehydrogenase (LDH)    Standing Status:   Future    Number of Occurrences:   1    Standing Expiration Date:   06/15/2023   Flow Cytometry, Peripheral Blood (Oncology)    Standing Status:   Future    Number of Occurrences:   1    Standing Expiration Date:   06/15/2023   Sedimentation rate    Standing Status:   Future    Number of Occurrences:   1    Standing Expiration Date:   06/15/2023   C-reactive protein    Standing Status:   Future    Number of  Occurrences:   1    Standing Expiration Date:   06/15/2023   Prostate-Specific AG, Serum    Standing Status:   Future    Number of Occurrences:   1    Standing Expiration Date:   06/15/2023    All questions were answered. The patient knows to call the clinic with any problems, questions or concerns.  I have spent a total of 60 minutes minutes of face-to-face and non-face-to-face time, preparing to see the patient, obtaining and/or reviewing separately obtained history, performing a medically appropriate examination, counseling and educating the patient, ordering medications/tests/procedures, documenting clinical information in the electronic health record, and care coordination.   Georga KaufmannIrene Gerell Fortson, PA-C Department of Hematology/Oncology [facility] Phone: [phone]  Patient was seen with Dr. Leonides Schanzorsey.  I have read the above note and personally examined the patient. I agree with the assessment and plan as noted above.  Briefly [name] is a 77 year old male with medical history significant for liver  transplant currently followed by the [facility] liver transplant team who presents for evaluation of a left axillary mass.  At this time findings are concerning for massive lymphadenopathy.  In the setting of his transplant I am deeply concerned about post transplant lymphoproliferative disorder (PTLD), lymphoma, or infection.  He is not having any signs or symptoms concerning for infection such as fevers, chills, sweats, or weight loss.  The mass is also tender.  At this time I would recommend pursuing a biopsy of the lesion.  We will begin with a core needle biopsy of the lesion.  Remarkably there is no other lymphadenopathy noted in the CT scans.  We will also reach out [facility] to make them aware of the situation.  The patient voices understanding of our findings and the plan moving forward.   Ulysees BarnsJohn T. Dorsey, MD Department of Hematology/Oncology [facility] Phone: [phone] Pager: [phone] Email: [email or web address]

## 2022-06-17 LAB — PROSTATE-SPECIFIC AG, SERUM (LABCORP): Prostate Specific Ag, Serum: 0.9 ng/mL (ref 0.0–4.0)

## 2022-06-17 LAB — SURGICAL PATHOLOGY

## 2022-06-20 ENCOUNTER — Other Ambulatory Visit (HOSPITAL_COMMUNITY): Payer: Self-pay | Admitting: Student

## 2022-06-21 ENCOUNTER — Ambulatory Visit (HOSPITAL_COMMUNITY)
Admission: RE | Admit: 2022-06-21 | Discharge: 2022-06-21 | Disposition: A | Discharge status: Home / Self Care | Payer: Medicare Other | Source: Ambulatory Visit | Attending: Physician Assistant | Admitting: Physician Assistant

## 2022-06-21 DIAGNOSIS — R591 Generalized enlarged lymph nodes: Secondary | ICD-10-CM | POA: Insufficient documentation

## 2022-06-21 DIAGNOSIS — R222 Localized swelling, mass and lump, trunk: Secondary | ICD-10-CM | POA: Diagnosis present

## 2022-06-21 LAB — CBC
HCT: 39.9 % (ref 39.0–52.0)
Hemoglobin: 13.7 g/dL (ref 13.0–17.0)
MCH: 31.4 pg (ref 26.0–34.0)
MCHC: 34.3 g/dL (ref 30.0–36.0)
MCV: 91.3 fL (ref 80.0–100.0)
Platelets: 207 10*3/uL (ref 150–400)
RBC: 4.37 MIL/uL (ref 4.22–5.81)
RDW: 13.2 % (ref 11.5–15.5)
WBC: 9 10*3/uL (ref 4.0–10.5)
nRBC: 0 % (ref 0.0–0.2)

## 2022-06-21 LAB — PROTIME-INR
INR: 1.2 (ref 0.8–1.2)
Prothrombin Time: 15 seconds (ref 11.4–15.2)

## 2022-06-21 LAB — FLOW CYTOMETRY

## 2022-06-21 LAB — AEROBIC/ANAEROBIC CULTURE W GRAM STAIN (SURGICAL/DEEP WOUND)

## 2022-06-21 MED ORDER — LIDOCAINE HCL (PF) 1 % IJ SOLN
10.0000 mL | Freq: Once | INTRAMUSCULAR | Status: AC
  Administered 2022-06-21: 10 mL via INTRADERMAL

## 2022-06-21 NOTE — Procedures (Signed)
Interventional Radiology Procedure Note  Procedure: Left axillary mass biopsy and aspiration with US guidance  Indication: Left axillary mass  Findings: Please refer to procedural dictation for full description.  Complications: None  EBL: < 10 mL  Acquanetta Belling, MD [phone]

## 2022-06-21 NOTE — Progress Notes (Signed)
    Risks and benefits of left axillary mass biopsy was discussed with the patient and/or patient's family including, but not limited to bleeding, infection, damage to adjacent structures or low yield requiring additional tests.  All of the questions were answered and there is agreement to proceed. Consent signed and in chart.

## 2022-06-22 LAB — AEROBIC/ANAEROBIC CULTURE W GRAM STAIN (SURGICAL/DEEP WOUND)

## 2022-06-22 LAB — CYTOLOGY - NON PAP

## 2022-06-23 ENCOUNTER — Telehealth: Payer: Self-pay | Admitting: Physician Assistant

## 2022-06-23 NOTE — Telephone Encounter (Signed)
I called Mr. [name] to review the biopsy results. Findings are most consistent with squamous cell carcinoma. Primary site is not confirmed but can be skin, lung, head/neck. Recommend PET scan for further evaluation. We will request prior skin biopsies slides to compare with axillary mass biopsy. Patient expressed understanding of the plan provided.

## 2022-06-25 LAB — AEROBIC/ANAEROBIC CULTURE W GRAM STAIN (SURGICAL/DEEP WOUND)

## 2022-06-26 LAB — AEROBIC/ANAEROBIC CULTURE W GRAM STAIN (SURGICAL/DEEP WOUND)

## 2022-06-29 ENCOUNTER — Encounter (HOSPITAL_COMMUNITY)
Admission: RE | Admit: 2022-06-29 | Discharge: 2022-06-29 | Disposition: A | Discharge status: Home / Self Care | Payer: Medicare Other | Source: Ambulatory Visit | Attending: Physician Assistant | Admitting: Physician Assistant

## 2022-06-29 DIAGNOSIS — C4492 Squamous cell carcinoma of skin, unspecified: Secondary | ICD-10-CM | POA: Insufficient documentation

## 2022-06-29 LAB — GLUCOSE, CAPILLARY: Glucose-Capillary: 92 mg/dL (ref 70–99)

## 2022-06-29 MED ORDER — FLUDEOXYGLUCOSE F - 18 (FDG) INJECTION
8.3000 | Freq: Once | INTRAVENOUS | Status: AC
  Administered 2022-06-29: 8.3 via INTRAVENOUS

## 2022-07-04 ENCOUNTER — Telehealth: Payer: Self-pay

## 2022-07-04 ENCOUNTER — Other Ambulatory Visit: Payer: Self-pay | Admitting: Physician Assistant

## 2022-07-04 ENCOUNTER — Telehealth: Payer: Self-pay | Admitting: Physician Assistant

## 2022-07-04 DIAGNOSIS — C4492 Squamous cell carcinoma of skin, unspecified: Secondary | ICD-10-CM

## 2022-07-04 DIAGNOSIS — E041 Nontoxic single thyroid nodule: Secondary | ICD-10-CM

## 2022-07-04 DIAGNOSIS — R591 Generalized enlarged lymph nodes: Secondary | ICD-10-CM

## 2022-07-04 DIAGNOSIS — M79622 Pain in left upper arm: Secondary | ICD-10-CM

## 2022-07-04 MED ORDER — OXYCODONE HCL 10 MG PO TABS: 10.0000 mg | ORAL_TABLET | Freq: Four times a day (QID) | ORAL | 0 refills | Status: DC | PRN

## 2022-07-04 NOTE — Telephone Encounter (Signed)
Need urgent referral to [facility] surgery for resection of left axillary mass   IT   Referral faxed and confirmation received

## 2022-07-04 NOTE — Telephone Encounter (Signed)
I called Mr. [name] to review the PET scan results that finalized today. There is hypermetabolism involving the left axillary nodal mass that was biopsy proven to be SCC. No other signs of metastatic disease.  Dr. Leonides Schanz recommends a surgical evaluation next so we will refer to [facility] today. Mr. [name] is need of a refill for his oxycodone but adds that it is providing him minimal relief of pain. I advised him that I will increase dose of oxycodone from 5 mg to 10 mg q 6 hours PRN.   Lastly, I advised him that his outside [facility] were sent to our derm pathologists to review with the axillary node biopsy to compare if this is a SCC of skin primary.   There is a suspected left thyroid nodule with hypermetabolism. Although unlikely to be related to patient's known malignancy, we will order a thyroid US for further evaluation.   Mr. [name] expressed understanding of the plan provided.

## 2022-07-07 ENCOUNTER — Ambulatory Visit (HOSPITAL_COMMUNITY)
Admission: RE | Admit: 2022-07-07 | Discharge: 2022-07-07 | Disposition: A | Discharge status: Home / Self Care | Payer: Medicare Other | Source: Ambulatory Visit | Attending: Physician Assistant | Admitting: Physician Assistant

## 2022-07-07 DIAGNOSIS — E041 Nontoxic single thyroid nodule: Secondary | ICD-10-CM | POA: Diagnosis present

## 2022-07-08 ENCOUNTER — Other Ambulatory Visit (HOSPITAL_COMMUNITY): Payer: Medicare Other

## 2022-07-15 ENCOUNTER — Inpatient Hospital Stay
Admission: RE | Admit: 2022-07-15 | Discharge: 2022-07-15 | Disposition: A | Discharge status: Home / Self Care | Payer: Self-pay | Source: Ambulatory Visit | Attending: General Surgery | Admitting: General Surgery

## 2022-07-15 ENCOUNTER — Other Ambulatory Visit: Payer: Self-pay | Admitting: General Surgery

## 2022-07-15 DIAGNOSIS — R2232 Localized swelling, mass and lump, left upper limb: Secondary | ICD-10-CM

## 2022-07-18 ENCOUNTER — Telehealth: Payer: Self-pay

## 2022-07-18 NOTE — Progress Notes (Signed)
Thoracic Location of Tumor / Histology: LEFT AXILLARY SOFT TISSUE MASS   CT Chest Abdomen Pelvis WO IV 06-13-22 IMPRESSION: 1.  Heterogeneous left axillary mass, suspicious for confluent lymphadenopathy, of likely malignant origin, with a differential including lymphoma and metastatic disease. This is an amenable to percutaneous biopsy. 2.  Solid pulmonary nodules within both lungs, the largest measuring 2 mm. 3.  Severe atherosclerosis. 4.  Mild splenomegaly.  NM PET Image Initial (PI) Skull Base To Thigh 06-29-22 IMPRESSION: 7.3 cm left axillary nodal mass, corresponding to the patient's known squamous cell carcinoma.   Suspected left inferior thyroid nodule with hypermetabolism. While likely unrelated to the patient's known malignancy, this warrants further evaluation with thyroid ultrasound.   Additional ancillary findings as above.  Patient presented  with symptoms of: due to palpable left axillary mass for 1-2 weeks, He reports the mass is tender to palpation but denies any redness or discharge. He adds that he has noticed worsening fatigue over the last several months which can interfere with his ADLs.  (per med onc note on 06-15-22)  Biopsies revealed:  06-15-22 Clinical history: lymphadenopathy   DIAGNOSIS:   Peripheral blood, flow cytometry:  -  No immunophenotypic evidence of a lymphoproliferative disorder (no  monoclonal B cells or immunophenotypically abnormal T cells detected).   GATING AND PHENOTYPIC ANALYSIS:   Gated population: Flow cytometric immunophenotyping is performed using  antibodies to the antigens listed in the table below. Electronic gates  are placed around a cell cluster displaying light scatter properties  corresponding to: lymphocytes   Abnormal Cells in gated population: N/A   Phenotype of Abnormal Cells: N/A   06-21-22 Clinical History: Left axillary mass.  Specimen Submitted:  A. LEFT AXILLARY MASS, FINE NEEDLE ASPIRATION:    FINAL  MICROSCOPIC DIAGNOSIS:  - Malignant cells consistent with squamous cell carcinoma   FINAL MICROSCOPIC DIAGNOSIS:   A. LEFT AXILLARY SOFT TISSUE MASS, NEEDLE CORE BIOPSY:  Well to moderately differentiated keratinizing squamous cell carcinoma   COMMENT:   The cores are comprised predominantly of tumor with scant desmoplastic  and reactive stroma.  No diagnostic lymph node or lymphoid stroma is  present.  Primary sites would include skin, lung and head neck among  others.  Clinical and radiologic correlation is recommended.   Tobacco/Marijuana/Snuff/ETOH use: former smoke, quit in 1982  Past/Anticipated interventions by cardiothoracic surgery, if any:  06-21-22 Interventional Radiology Procedure Note   Procedure: Left axillary mass biopsy and aspiration with US guidance   Indication: Left axillary mass  Dr. Donell Beers on 07-15-22 History of Present Illness: [name] is a 77 y.o. male who is seen today as an office consultation for evaluation of NEW CANCER (SCC of left axillary node)  Patient presents with a left axillary mass that he noticed in January of this year. This is in a site where he fell several years ago and experienced a rotator cuff injury. The mass has been getting bigger since January. It has gotten to where it is so painful he cannot sleep and cannot get comfortable. He has been taking pain medication that is not adequate. He continues to have burning pain in his left arm. He also has swelling and heaviness in his left arm. He has had some imaging that we do not have but occurred at [employer] [facility] [employer]. He has had a PET and a biopsy. The PET did not show any other disease other than the left axillary mass and the biopsy demonstrated this to be squamous cell  carcinoma. He has gone to see Dr Glennis Brink [facility] and has had several skin biopsies. He is not particularly noted any large skin lesions. However, he does have a tattoo sleeve  covering this entire arm.  Of note, he has had a liver transplant at [facility] around 10 years ago. He is on immunosuppression. He religiously follows up with them. He is currently on cyclosporine as his only immunosuppression.   Assessment and Plan:   Diagnoses and all orders for this visit:  Cancer related pain - gabapentin (NEURONTIN) 100 MG capsule; Take 1 capsule (100 mg total) by mouth 3 (three) times daily - oxyCODONE (OXYIR) 10 mg immediate release tablet; Take 1-2 tablets (10-20 mg total) by mouth every 4 (four) hours as needed for Pain for up to 5 days - baclofen (LIORESAL) 10 MG tablet; Take 1 tablet (10 mg total) by mouth 3 (three) times daily as needed for up to 60 days  S/P liver transplant (CMS-HCC)  Stage 3b chronic kidney disease (CMS/HHS-HCC)  Immunosuppression , unspecified(CMS-HCC)  Axillary mass, left - gabapentin (NEURONTIN) 100 MG capsule; Take 1 capsule (100 mg total) by mouth 3 (three) times daily - oxyCODONE (OXYIR) 10 mg immediate release tablet; Take 1-2 tablets (10-20 mg total) by mouth every 4 (four) hours as needed for Pain for up to 5 days - baclofen (LIORESAL) 10 MG tablet; Take 1 tablet (10 mg total) by mouth 3 (three) times daily as needed for up to 60 days  Metastatic squamous cell carcinoma to lymph node (CMS/HHS-HCC) - gabapentin (NEURONTIN) 100 MG capsule; Take 1 capsule (100 mg total) by mouth 3 (three) times daily - oxyCODONE (OXYIR) 10 mg immediate release tablet; Take 1-2 tablets (10-20 mg total) by mouth every 4 (four) hours as needed for Pain for up to 5 days - baclofen (LIORESAL) 10 MG tablet; Take 1 tablet (10 mg total) by mouth 3 (three) times daily as needed for up to 60 days   Patient has a large malignant left axillary mass that appears to be squamous cell carcinoma. It is unclear where the primary is. He did have several biopsies that we do not have reports of. I am getting his MRI that he has had with contrast. I suspect that given the  fixed nature of the mass that we may need preoperative radiation to downsize the mass. If not, we will proceed with resection.    We will proceed once I have information from the MRI.  Past/Anticipated interventions by medical oncology, if any:  Raymondo Band 06-15-22 ASSESSMENT & PLAN ELRIDGE REASNER is a 77 y.o. male who presents to the diagnostic clinic for evaluation of left axillary lymphadenopathy. I discussed possible etiologies including infectious process, inflammatory process and underlying malignancy. Patient is not exhibiting any infectious symptoms at this time. I reviewed the proposed workup which includes laboratory evaluation today followed by urgent ultrasound guided core needle biopsy. Patient is in agreement with the plan and would like to proceed with the workup.     #Left axillary mass/lymphadenopathy: --Seen on CT CAP WO contrast from 06/13/2022 at [facility]. Requested images to be pushed into our system for review --Labs today to check CBC, CMP, LDH, flow cytometry, ESR, CRP, PSA levels --Requested STAT US guided biopsy of lymph node. --RTC once workup is complete.   #Nausea/Vomiting: --Sent prescription for Zofran 8 mg q 8 hours PRN.   #Left axillary pain: --Secondary to mass/lymph node --Currently taking tramadol 50  mg with minimal pain relief --Recommend to discontinue tramadol and try oxycodone 5-10 mg q 4-6 hours PRN.   #H/O Liver transplant: --Performed at [facility] in 2014 secondary to NASH. --Currently on immunosuppressive therapy.  --Continues to follow with [facility] team  Signs/Symptoms Weight changes, if any: yes, cannot eat or drink due to pain a lot lately Respiratory complaints, if any: no Hemoptysis, if any: no Pain issues, if any:  yes, severe pain remains at left mass site, states he cannot sleep well or move his arm well at this time with site currently (he stated that pain and Rom were better when the mass was last drained). He  states oxy does not help much overall with this pain and only mildly alleviates it. Rn encouraged to speak with his PCP or come to ED if he needs further pain relief.   SAFETY ISSUES: Prior radiation? no Pacemaker/ICD? no  Possible current pregnancy? na Is the patient on methotrexate? no  Current Complaints / other details:  Wants to know the plan and wants to get started. He states he has dealt with this for a long time and is ready for relief.    medical history significant for NASH s/p liver transplant with ongoing immunosuppressive drug therapy,

## 2022-07-18 NOTE — Telephone Encounter (Signed)
-----   Message from [name], PA-C sent at 07/18/2022 12:53 PM EDT ----- Can you forward his US thyroid results to his PCP from [facility] and request a 1 year surveillance [place].    ----- Message ----- From: Interface, Rad Results In Sent: 07/07/2022   4:14 PM EDT To: [name], PA-C

## 2022-07-18 NOTE — Telephone Encounter (Signed)
Thyroid US faxed to pt's PCP Roe Rutherford, NP.  Confirmation received

## 2022-07-20 LAB — SURGICAL PATHOLOGY

## 2022-07-21 ENCOUNTER — Telehealth: Payer: Self-pay | Admitting: Physician Assistant

## 2022-07-21 DIAGNOSIS — C4492 Squamous cell carcinoma of skin, unspecified: Secondary | ICD-10-CM

## 2022-07-21 NOTE — Telephone Encounter (Signed)
I called [name] to review the update from the derm pathology review which strongly suggests cutaneous primary. Patient is complain of extreme pain from the axillary  mass. He adds that during the axillary mass biopsy, IR aspirated fluid which immediately improved his pain. He is concerned the fluid has return causing the pain.   I will request US guided aspiration of the mass by IR. Patient expressed understanding of the plan provided.

## 2022-07-22 ENCOUNTER — Encounter: Payer: Self-pay | Admitting: Radiation Oncology

## 2022-07-22 ENCOUNTER — Encounter: Payer: Self-pay | Admitting: General Practice

## 2022-07-22 ENCOUNTER — Telehealth: Payer: Self-pay

## 2022-07-22 NOTE — Progress Notes (Signed)
[name], MD  [name], NT OK for US guided left thyroid nodule FNA.  Thyroid US - 07/07/2022 - image 35  Thx, [name]       Previous Messages    ----- Message ----- From: [name], NT Sent: 07/21/2022   2:08 PM EDT To: Ir Procedure Requests Subject: [place] FNA SOFT TISSUE                            Procedure: [place] FNA SOFT TISSUE  Reason: axillary SCC, previous biopsy aspirated fluid which improved pain from the mass. Patient is having pain again and need to see if there is fluid for aspiration Dx: SCC (squamous cell carcinoma)  History: MR, [place], and PET in chart  Provider: [name], PA-C  Contact: [phone]

## 2022-07-22 NOTE — Telephone Encounter (Signed)
Rn called to obtain meaningful use and nurse evaluation information prior to telephone consult with Dr. Basilio Cairo on Monday. Pt overall dealing with pain and Rom issues related to this mass. Note completed and routed to Dr. Basilio Cairo.

## 2022-07-24 NOTE — Progress Notes (Signed)
Radiation Oncology         ([phone]) [phone] ________________________________  Initial Outpatient Consultation  Name: [name] MRN: [id number]  Date: 07/25/2022  DOB: [birth year]  JX:[id number], Toni Amend, NP  Almond Lint, MD   REFERRING PHYSICIAN: Almond Lint, MD  DIAGNOSIS: No diagnosis found.   Cancer Staging  No matching staging information was found for the patient.  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 male who presented to the [facility] rapid diagnostic clinic on 06/15/22 with a painful left axillary mass that he first noticed several weeks prior. He also endorsed worsening fatigue over the last several months which would interfere with his ADLs. Per encounter notes, the patient notes that the palpable site correlated with an old rotator cuff injury resulting from a fall.   His presentation prompted a CT CAP on 06/13/22 which demonstrated a 7.2 x 4.6 x 6.0 cm left axillary mass.    Biopsies of the left axillary mass on 06/21/22 showed findings consistent with moderately differentiated keratinizing squamous cell carcinoma    PET scan on 06/29/22 demonstrated the 7.3 cm left axillary nodal mass corresponding with the biopsy proven site of squamous cell carcinoma, and a possible left inferior thyroid nodule with hypermetabolism which warrants further evaluation.     Thyroid US on 07/07/22 showed a single solid left inferior thyroid nodule measuring 1 cm which corresponds to PET finding. This could possible represent a parathyroid gland/adenoma and meets criteria for 1 year surveillance.   He was subsequently referred to Dr. Donell Beers on 07/15/22 to discuss treatment options. During this visit, the patient endorsed burning pain in his left arm as well as swelling and heaviness in his left arm. In terms of  treatment, Dr. Donell Beers has discussed the possibility of preoperative radiation to downsize the mass given the fixed nature of the mass. If this is not feasible, then he will likely proceed with radiation. Dr. Donell Beers has also ordered an MRI which will be a determining factor in his surgical treatment decision. Results are pending at this time.   Of note: he had a liver transplant performed at [facility] around 10 years ago. He is on immunosuppression - cyclosporine which is his only immunosuppression medication    ***  PREVIOUS RADIATION THERAPY: No  PAST MEDICAL HISTORY:  has a past medical history of Anemia, Atherosclerosis, BPH (benign prostatic hyperplasia), Cardiomegaly, Cirrhosis of liver (HCC), Coronary artery disease (2004), DM (diabetes mellitus) (HCC), Erectile dysfunction, Esophageal varices (HCC), GERD (gastroesophageal reflux disease), Gynecomastia, male, History of transfusion of whole blood, Hyperlipidemia, Hypertension, Hypothyroidism, Overactive bladder, Spleen enlarged, Thrombocytopenia (HCC), and Thrombus.    PAST SURGICAL HISTORY: Past Surgical History:  Procedure Laterality Date   CARDIAC CATHETERIZATION  10/09/06   CATARACT EXTRACTION W/PHACO Left 04/20/2015   Procedure: CATARACT EXTRACTION PHACO AND INTRAOCULAR LENS PLACEMENT LEFT EYE CDE=14.92;  Surgeon: Gemma Payor, MD;  Location: [facility];  Service: Ophthalmology;  Laterality: Left;   COLONOSCOPY  01/2010   portal colopathy, sigmoid AVM (nonbleeding)   COLONOSCOPY WITH PROPOFOL N/A 12/21/2017   Dr. Jena Gauss: sigmoid diverticulosis, otherwise normal   CORONARY ARTERY BYPASS GRAFT  2004   3 vessels   ESOPHAGOGASTRODUODENOSCOPY  01/2010   4 COLUMNS 2-3 GRADE ESOPHAGEAL VARICES,S/P BANDING,PORTAL GASTROPATHY   ESOPHAGOGASTRODUODENOSCOPY  June 2012   Dr. Rourk-->3 columns of grade 1 esophageal  varices with overlying scar, no banding treatment required, portal gastropathy. Next EGD in December 2013   ESOPHAGOGASTRODUODENOSCOPY N/A  04/16/2012   Dr. Jena Gauss: 3 columns Grade 2-3 esophageal varices, portal gastropathy, gastric antral vascular ectasia   LIVER TRANSPLANT  01/2013   ROTATOR CUFF REPAIR Left     FAMILY HISTORY: family history includes Heart attack in his brother; Heart disease (age of onset: 79) in his mother; Stroke (age of onset: 73) in his father.  SOCIAL HISTORY:  reports that he quit smoking about 48 years ago. His smoking use included cigarettes. He has a 40.00 pack-year smoking history. He has never used smokeless tobacco. He reports that he does not drink alcohol and does not use drugs.  ALLERGIES: Patient has no known allergies.  MEDICATIONS:  Current Outpatient Medications  Medication Sig Dispense Refill   aspirin 81 MG tablet Take 81 mg by mouth daily.     atorvastatin (LIPITOR) 20 MG tablet Take 20 mg by mouth at bedtime.     Calcium Carbonate-Vitamin D (CALCIUM 600+D PO) Take 1 tablet by mouth daily.     carvedilol (COREG) 6.25 MG tablet Take 6.25 mg by mouth at bedtime.      cycloSPORINE modified (NEORAL) 100 MG capsule Take 200-300 mg by mouth See admin instructions. Take 300mg  by mouth in the morning and 200 mg in the evening     hydrochlorothiazide (HYDRODIURIL) 25 MG tablet Take 25 mg by mouth daily.     insulin glargine (LANTUS) 100 UNIT/ML injection Inject 14 Units into the skin daily.     levothyroxine (SYNTHROID, LEVOTHROID) 125 MCG tablet Take 125 mcg by mouth daily before breakfast.     Magnesium Oxide (MAGOX 400 PO) Take 1,200 mg by mouth daily.      ondansetron (ZOFRAN) 8 MG tablet Take 1 tablet (8 mg total) by mouth every 8 (eight) hours as needed for nausea or vomiting. 20 tablet 0   oxyCODONE 10 MG TABS Take 1 tablet (10 mg total) by mouth every 6 (six) hours as needed for severe pain. 120 tablet 0   No current facility-administered medications for this encounter.    REVIEW OF SYSTEMS: As above in HPI.   PHYSICAL EXAM:  vitals were not taken for this visit.   General: Alert  and oriented, in no acute distress HEENT: Head is normocephalic. Extraocular movements are intact. Oropharynx is clear. Neck: Neck is supple, no palpable cervical or supraclavicular lymphadenopathy. Heart: Regular in rate and rhythm with no murmurs, rubs, or gallops. Chest: Clear to auscultation bilaterally, with no rhonchi, wheezes, or rales. Abdomen: Soft, nontender, nondistended, with no rigidity or guarding. Extremities: No cyanosis or edema. Lymphatics: see Neck Exam Skin: No concerning lesions. Musculoskeletal: symmetric strength and muscle tone throughout. Neurologic: Cranial nerves II through XII are grossly intact. No obvious focalities. Speech is fluent. Coordination is intact. Psychiatric: Judgment and insight are intact. Affect is appropriate. Breasts: *** . No other palpable masses appreciated in the breasts or axillae *** .    ECOG = ***  0 - Asymptomatic (Fully active, able to carry on all predisease activities without restriction)  1 - Symptomatic but completely ambulatory (Restricted in physically strenuous activity but ambulatory and able to carry out work of a light or sedentary nature. For example, light housework, office work)  2 - Symptomatic, <50% in bed during the day (Ambulatory and capable of all self care but unable to carry out any work activities. Up and about more than 50% of waking hours)  3 - Symptomatic, >50% in bed, but not bedbound (Capable of only limited self-care, confined to bed or chair 50% or more of waking hours)  4 - Bedbound (Completely disabled. Cannot carry on any self-care. Totally confined to bed or chair)  5 - Death   Santiago Glad MM, Creech RH, Tormey DC, et al. [phone]). "Toxicity and response criteria of the [facility]". Am. Evlyn Clines. Oncol. 5 (6): 649-55   LABORATORY DATA:  Lab Results  Component Value Date   WBC 9.0 06/21/2022   HGB 13.7 06/21/2022   HCT 39.9 06/21/2022   MCV 91.3 06/21/2022   PLT 207  06/21/2022   CMP     Component Value Date/Time   NA 134 (L) 06/15/2022 1603   NA 143 09/06/2011 1513   K 4.6 06/15/2022 1603   K 4.3 09/06/2011 1513   CL 99 06/15/2022 1603   CL 110 09/06/2011 1513   CO2 28 06/15/2022 1603   CO2 27 09/06/2011 1513   GLUCOSE 153 (H) 06/15/2022 1603   BUN 35 (H) 06/15/2022 1603   BUN 21 09/06/2011 1513   CREATININE 1.93 (H) 06/15/2022 1603   CREATININE 0.91 05/28/2012 1610   CALCIUM 9.8 06/15/2022 1603   CALCIUM 8.5 09/06/2011 1513   PROT 7.8 06/15/2022 1603   PROT 6.7 09/06/2011 1513   ALBUMIN 3.7 06/15/2022 1603   ALBUMIN 3.0 09/06/2011 1513   AST 17 06/15/2022 1603   ALT 12 06/15/2022 1603   ALT 16 08/03/2010 1012   ALKPHOS 70 06/15/2022 1603   ALKPHOS 98 09/06/2011 1513   BILITOT 1.8 (H) 06/15/2022 1603   GFRNONAA 35 (L) 06/15/2022 1603   GFRAA 49 (L) 12/14/2017 1115         RADIOGRAPHY: US THYROID  Result Date: 07/07/2022 CLINICAL DATA:  Incidental on PET. EXAM: THYROID ULTRASOUND TECHNIQUE: Ultrasound examination of the thyroid gland and adjacent soft tissues was performed. COMPARISON:  06/29/2022 FINDINGS: Parenchymal Echotexture: Mildly heterogeneous Isthmus: 0.3 cm Right lobe: 3.1 x 1.5 x 1.2 cm Left lobe: 2.5 x 1.5 x 1.2 cm ________________________________________________________ Estimated total number of nodules >/= 1 cm: 1 Number of spongiform nodules >/=  2 cm not described below (TR1): 0 Number of mixed cystic and solid nodules >/= 1.5 cm not described below (TR2): 0 _________________________________________________________ Nodule # 1: Location: Left; Inferior Maximum size: 1.0 cm; Other 2 dimensions: 0.9 x 0.8 cm Composition: solid/almost completely solid (2) Echogenicity: hypoechoic (2) Shape: not taller-than-wide (0) Margins: smooth (0) Echogenic foci: none (0) ACR TI-RADS total points: 4. ACR TI-RADS risk category: TR4 (4-6 points). ACR TI-RADS recommendations: *Given size (>/= 1 - 1.4 cm) and appearance, a follow-up ultrasound in  1 year should be considered based on TI-RADS criteria. _________________________________________________________ No cervical lymphadenopathy. IMPRESSION: Single solid left inferior thyroid nodule (labeled 1, 1.0 cm) which corresponds with hypermetabolic focus on recent PET-CT. Given posterior location, this nodule could conceivably represent parathyroid gland/adenoma. The nodule meets criteria (TI-RADS category 4) for 1 year ultrasound surveillance. The above is in keeping with the ACR TI-RADS recommendations - J Am Coll Radiol 2017;14:587-595. Marliss Coots, MD [facility]iology [facility] Radiology Electronically Signed   By: Marliss Coots M.D.   On: 07/07/2022 16:12   NM PET Image Initial (PI) Skull Base To Thigh  Result Date: 07/04/2022 CLINICAL DATA:  Initial treatment strategy for left axillary squamous cell carcinoma, unknown primary. EXAM: NUCLEAR MEDICINE PET SKULL BASE TO THIGH TECHNIQUE: 8.4 mCi F-18 FDG was injected intravenously. Full-ring PET imaging  was performed from the skull base to thigh after the radiotracer. CT data was obtained and used for attenuation correction and anatomic localization. Fasting blood glucose: 92 mg/dl COMPARISON:  Outside hospital [facility]) CT chest abdomen pelvis dated 06/13/2022. FINDINGS: Mediastinal blood pool activity: SUV max 2.6 Liver activity: SUV max NA NECK: No hypermetabolic cervical lymphadenopathy. Incidental CT findings: None. CHEST: No hypermetabolic pulmonary nodules. 7.3 x 4.9 cm left axillary nodal mass (series 4/image 56), corresponding to the patient's known squamous cell carcinoma, max SUV 7.8. Lesion is notable for peripheral hypermetabolism and central necrosis. Focal hypermetabolism along the inferior aspect of the left thyroid, max SUV 9.9. This appearance suggests a thyroid nodule but is poorly evaluated on unenhanced CT. Incidental CT findings: Atherosclerotic calcifications of the aortic root/arch. Severe  three-vessel coronary atherosclerosis. Postsurgical changes related to prior CABG. ABDOMEN/PELVIS: No abnormal hypermetabolism in the liver, spleen, pancreas, or adrenal glands. Suspected postprocedural changes related to prior liver transplant. No hypermetabolic abdominopelvic lymphadenopathy. Incidental CT findings: Status post cholecystectomy. Extensive vascular calcifications. SKELETON: No focal hypermetabolic activity to suggest skeletal metastasis. Incidental CT findings: Degenerative changes of the visualized thoracolumbar spine. IMPRESSION: 7.3 cm left axillary nodal mass, corresponding to the patient's known squamous cell carcinoma. Suspected left inferior thyroid nodule with hypermetabolism. While likely unrelated to the patient's known malignancy, this warrants further evaluation with thyroid ultrasound. Additional ancillary findings as above. Electronically Signed   By: Charline Bills M.D.   On: 07/04/2022 02:38      IMPRESSION/PLAN: ***   It was a pleasure meeting the patient today. We discussed the risks, benefits, and side effects of radiotherapy. I recommend radiotherapy to the *** to reduce his risk of locoregional recurrence by 2/3.  We discussed that radiation would take approximately *** weeks to complete and that I would give the patient a few weeks to heal following surgery before starting treatment planning. *** If chemotherapy were to be given, this would precede radiotherapy. We spoke about acute effects including skin irritation and fatigue as well as much less common late effects including internal organ injury or irritation. We spoke about the latest technology that is used to minimize the risk of late effects for patients undergoing radiotherapy to the breast or chest wall. No guarantees of treatment were given. The patient is enthusiastic about proceeding with treatment. I look forward to participating in the patient's care.  I will await his referral back to me for postoperative  follow-up and eventual CT simulation/treatment planning.  On date of service, in total, I spent *** minutes on this encounter. Patient was seen in person.   __________________________________________   Lonie Peak, MD  This document serves as a record of services personally performed by Lonie Peak, MD. It was created on her behalf by Neena Rhymes, a trained medical scribe. The creation of this record is based on the scribe's personal observations and the provider's statements to them. This document has been checked and approved by the attending provider.

## 2022-07-25 ENCOUNTER — Ambulatory Visit
Admission: RE | Admit: 2022-07-25 | Discharge: 2022-07-25 | Disposition: A | Discharge status: Home / Self Care | Payer: Medicare Other | Source: Ambulatory Visit | Attending: Radiation Oncology | Admitting: Radiation Oncology

## 2022-07-25 ENCOUNTER — Telehealth: Payer: Self-pay

## 2022-07-25 ENCOUNTER — Encounter: Payer: Self-pay | Admitting: Radiation Oncology

## 2022-07-25 DIAGNOSIS — C773 Secondary and unspecified malignant neoplasm of axilla and upper limb lymph nodes: Secondary | ICD-10-CM | POA: Insufficient documentation

## 2022-07-25 NOTE — Telephone Encounter (Signed)
Rn called pt to let him know that Dr. Basilio Cairo and Dr. Donell Beers were in agreement for pre-op RT. He was grateful for this news since he has been in so much pain. He continues to report concern for his blood sugar being more elevated. Rn assured him that this is normal when your body is under stress (such as pain, inflammation, etc.). He stated his primary doctor told him the same thing. Rn encouraged him to keep an eye on his blood sugar and notify the primary doctor with any needs related to his blood sugar.

## 2022-07-26 ENCOUNTER — Other Ambulatory Visit: Payer: Self-pay

## 2022-07-26 ENCOUNTER — Ambulatory Visit: Admission: RE | Admit: 2022-07-26 | Payer: Medicare Other | Source: Ambulatory Visit | Admitting: Radiation Oncology

## 2022-07-26 DIAGNOSIS — C773 Secondary and unspecified malignant neoplasm of axilla and upper limb lymph nodes: Secondary | ICD-10-CM | POA: Diagnosis present

## 2022-07-26 DIAGNOSIS — Z510 Encounter for antineoplastic radiation therapy: Secondary | ICD-10-CM | POA: Insufficient documentation

## 2022-07-26 DIAGNOSIS — C4492 Squamous cell carcinoma of skin, unspecified: Secondary | ICD-10-CM | POA: Insufficient documentation

## 2022-07-27 ENCOUNTER — Encounter (HOSPITAL_COMMUNITY): Payer: Self-pay

## 2022-07-27 ENCOUNTER — Ambulatory Visit (HOSPITAL_COMMUNITY): Admission: RE | Admit: 2022-07-27 | Payer: Medicare Other | Source: Ambulatory Visit

## 2022-07-27 NOTE — Progress Notes (Signed)
Oley Balm, MD  [name] Aspiration only No drain       Previous Messages    ----- Message ----- From: [name] Sent: 07/27/2022   9:12 AM EDT To: Oley Balm, MD Subject: RE: [place] FNA soft tissue                        Dr Deanne Coffer would this be just an [place] BX soft tissue or US guided drainage??  Thanks [employer]

## 2022-07-27 NOTE — Progress Notes (Signed)
Oley Balm, MD  [name] PROCEDURE / BIOPSY REVIEW Date: 07/27/22  Requested Biopsy site: L axilla Reason for request: pain. Remove as much fluid as possible for symptom relief. Mir got 6ml last time just fyi Imaging review: Best seen on PET 06/10/22 Im 20 Se 6  Decision: Approved Imaging modality to perform: Ultrasound Schedule with: Moderate Sedation Schedule for: Any VIR  Additional comments:   Please contact me with questions, concerns, or if issue pertaining to this request arise.  Dayne Oley Balm, MD [facility]ascular and Interventional Radiology Specialists [facility] Radiology

## 2022-07-29 ENCOUNTER — Ambulatory Visit (HOSPITAL_COMMUNITY)
Admission: RE | Admit: 2022-07-29 | Discharge: 2022-07-29 | Disposition: A | Discharge status: Home / Self Care | Payer: Medicare Other | Source: Ambulatory Visit | Attending: Vascular Surgery | Admitting: Vascular Surgery

## 2022-07-29 ENCOUNTER — Telehealth: Payer: Self-pay

## 2022-07-29 ENCOUNTER — Other Ambulatory Visit: Payer: Self-pay | Admitting: *Deleted

## 2022-07-29 DIAGNOSIS — Z01818 Encounter for other preprocedural examination: Secondary | ICD-10-CM | POA: Diagnosis present

## 2022-07-29 DIAGNOSIS — Z510 Encounter for antineoplastic radiation therapy: Secondary | ICD-10-CM | POA: Diagnosis not present

## 2022-07-29 NOTE — Telephone Encounter (Signed)
Rn called pt of inform him that his treatment time on 08-04-22 is now at 2:45pm. He understood and was able to verbalize back to RN.

## 2022-07-29 NOTE — Telephone Encounter (Signed)
Rn Victorino Dike called pt wife after a message was left for nursing staff to call them back. This phone call was inquiring about a leg study that had been ordered (Rn researched it and our office did not order it). Next she was asking about his radiation treatment on 08-04-22. The pt was supposed to have a Thyroid biopsy earlier this week but had to reschedule for 08-04-22 (the same day as his treatment). Rn sent an email to L3 to see how we can work a time that would work better so the pt can get both done on the same day. Pt wife still does not want to wait a long time between biopsy and treatment (since they will be in [place] already). Rn will reach back out to family once L3 has provided feedback on the situation.

## 2022-07-29 NOTE — Telephone Encounter (Signed)
RN called to inform pt his new radiation time is 2:45 pm on 08-04-22. This time had to be pushed back due to pt procedure earlier in the day. He understood and was able to verbalize information back to RN.

## 2022-08-02 ENCOUNTER — Encounter: Payer: Self-pay | Admitting: Physician Assistant

## 2022-08-03 ENCOUNTER — Ambulatory Visit: Payer: Medicare Other | Admitting: Radiation Oncology

## 2022-08-04 ENCOUNTER — Other Ambulatory Visit: Payer: Self-pay

## 2022-08-04 ENCOUNTER — Telehealth: Payer: Self-pay

## 2022-08-04 ENCOUNTER — Ambulatory Visit
Admission: RE | Admit: 2022-08-04 | Discharge: 2022-08-04 | Disposition: A | Discharge status: Home / Self Care | Payer: Medicare Other | Source: Ambulatory Visit | Attending: Radiation Oncology | Admitting: Radiation Oncology

## 2022-08-04 ENCOUNTER — Ambulatory Visit (HOSPITAL_COMMUNITY)
Admission: RE | Admit: 2022-08-04 | Discharge: 2022-08-04 | Disposition: A | Discharge status: Home / Self Care | Payer: Medicare Other | Source: Ambulatory Visit | Attending: Physician Assistant | Admitting: Physician Assistant

## 2022-08-04 DIAGNOSIS — C4492 Squamous cell carcinoma of skin, unspecified: Secondary | ICD-10-CM

## 2022-08-04 DIAGNOSIS — R222 Localized swelling, mass and lump, trunk: Secondary | ICD-10-CM | POA: Insufficient documentation

## 2022-08-04 DIAGNOSIS — Z510 Encounter for antineoplastic radiation therapy: Secondary | ICD-10-CM | POA: Diagnosis not present

## 2022-08-04 DIAGNOSIS — C773 Secondary and unspecified malignant neoplasm of axilla and upper limb lymph nodes: Secondary | ICD-10-CM

## 2022-08-04 LAB — RAD ONC ARIA SESSION SUMMARY
Course Elapsed Days: 0
Plan Fractions Treated to Date: 1
Plan Prescribed Dose Per Fraction: 2 Gy
Plan Total Fractions Prescribed: 30
Plan Total Prescribed Dose: 60 Gy
Reference Point Dosage Given to Date: 2 Gy
Reference Point Session Dosage Given: 2 Gy
Session Number: 1

## 2022-08-04 MED ORDER — LIDOCAINE HCL (PF) 1 % IJ SOLN
2.0000 mL | Freq: Once | INTRAMUSCULAR | Status: AC
  Administered 2022-08-04: 2 mL via INTRADERMAL

## 2022-08-04 NOTE — Telephone Encounter (Signed)
Rn called pt wife back after a message was left on voicemail. Pt wife Samson Frederic was inquiring about pending insurance approval for pt radiation treatment. On the phone with RN, pt wife stated she called the insurance company yesterday. The insurance company told her that they had not heard of this request yet. Pt treatment was canceled yesterday due to pending authorization. He is also scheduled for today. Rn is reaching out to [place] for assistance with this matter.

## 2022-08-04 NOTE — Procedures (Signed)
Interventional Radiology Procedure:   Indications: Pain at left axillary mass  Procedure: US guided aspiration of left axillary mass fluid  Findings: Fluid within the left axilla mass was aspirated.  Aspirated 50 ml of cloudy brown and red fluid.  Fluid is foul smelling.  Complications: None     EBL: Minimal  Plan: Send fluid for culture.  Hartwell Vandiver R. Lowella Dandy, MD  Pager: [phone]

## 2022-08-04 NOTE — Progress Notes (Signed)
[name], MD  [name] There is no significant fluid within the mass on recent MR from 07/15/22.  If there is worsening pain, would recommend left axillary soft tissue ultrasound to evaluate for new fluid component prior to approval for aspiration.  Thanks,  [name]       Previous Messages    ----- Message ----- From: [name] Sent: 07/26/2022   3:48 PM EDT To: [name], MD Subject: FW: [place] FNA soft tissue                        Can you look at this one please I need to r/s his appt from [facility] to [facility] or [facility] since order has been changed.  Thanks [name] ----- Message ----- From: [name] Sent: 07/26/2022   9:07 AM EDT To: Ir Procedure Requests Subject: [place] FNA soft tissue                            Procedure: [place] FNA Soft tissue   Reason:axillary SCC, previous biopsy aspirated fluid which improved pain from the mass. Patient is having pain again and need to see if there is fluid for aspiration  History:Imaging in EPIC  Provider: [name], PA-C  Provider Contact # [phone]  Patient was already approved for [place] FNA and now provider has changed order to [place] FNA Soft Tissue.  Here is the previous review:  [name], MD  [name], NT OK for US guided left thyroid nodule FNA.  Thyroid US - 07/07/2022 - image 35  Thx, [name]

## 2022-08-04 NOTE — Telephone Encounter (Signed)
RN called pt wife Samson Frederic to let her know that insurance has now approved his radiation treatment. Rn assured pt wife that he can start today with this first appointment.

## 2022-08-05 ENCOUNTER — Ambulatory Visit
Admission: RE | Admit: 2022-08-05 | Discharge: 2022-08-05 | Disposition: A | Discharge status: Home / Self Care | Payer: Medicare Other | Source: Ambulatory Visit | Attending: Radiation Oncology | Admitting: Radiation Oncology

## 2022-08-05 ENCOUNTER — Other Ambulatory Visit: Payer: Self-pay

## 2022-08-05 DIAGNOSIS — Z510 Encounter for antineoplastic radiation therapy: Secondary | ICD-10-CM | POA: Diagnosis not present

## 2022-08-05 LAB — RAD ONC ARIA SESSION SUMMARY
Course Elapsed Days: 1
Plan Fractions Treated to Date: 2
Plan Prescribed Dose Per Fraction: 2 Gy
Plan Total Fractions Prescribed: 30
Plan Total Prescribed Dose: 60 Gy
Reference Point Dosage Given to Date: 4 Gy
Reference Point Session Dosage Given: 2 Gy
Session Number: 2

## 2022-08-06 LAB — AEROBIC/ANAEROBIC CULTURE W GRAM STAIN (SURGICAL/DEEP WOUND)

## 2022-08-08 ENCOUNTER — Other Ambulatory Visit: Payer: Self-pay

## 2022-08-08 ENCOUNTER — Ambulatory Visit
Admission: RE | Admit: 2022-08-08 | Discharge: 2022-08-08 | Disposition: A | Discharge status: Home / Self Care | Payer: Medicare Other | Source: Ambulatory Visit | Attending: Radiation Oncology | Admitting: Radiation Oncology

## 2022-08-08 DIAGNOSIS — C773 Secondary and unspecified malignant neoplasm of axilla and upper limb lymph nodes: Secondary | ICD-10-CM | POA: Insufficient documentation

## 2022-08-08 DIAGNOSIS — R2232 Localized swelling, mass and lump, left upper limb: Secondary | ICD-10-CM | POA: Insufficient documentation

## 2022-08-08 DIAGNOSIS — C779 Secondary and unspecified malignant neoplasm of lymph node, unspecified: Secondary | ICD-10-CM | POA: Insufficient documentation

## 2022-08-08 LAB — RAD ONC ARIA SESSION SUMMARY
Course Elapsed Days: 4
Plan Fractions Treated to Date: 3
Plan Prescribed Dose Per Fraction: 2 Gy
Plan Total Fractions Prescribed: 30
Plan Total Prescribed Dose: 60 Gy
Reference Point Dosage Given to Date: 6 Gy
Reference Point Session Dosage Given: 2 Gy
Session Number: 3

## 2022-08-08 MED ORDER — SONAFINE EX EMUL
1.0000 | Freq: Two times a day (BID) | CUTANEOUS | Status: DC
  Administered 2022-08-08: 1 via TOPICAL

## 2022-08-09 ENCOUNTER — Other Ambulatory Visit: Payer: Self-pay

## 2022-08-09 ENCOUNTER — Ambulatory Visit
Admission: RE | Admit: 2022-08-09 | Discharge: 2022-08-09 | Disposition: A | Discharge status: Home / Self Care | Payer: Medicare Other | Source: Ambulatory Visit | Attending: Radiation Oncology | Admitting: Radiation Oncology

## 2022-08-09 DIAGNOSIS — C773 Secondary and unspecified malignant neoplasm of axilla and upper limb lymph nodes: Secondary | ICD-10-CM | POA: Diagnosis not present

## 2022-08-09 LAB — RAD ONC ARIA SESSION SUMMARY
Course Elapsed Days: 5
Plan Fractions Treated to Date: 4
Plan Prescribed Dose Per Fraction: 2 Gy
Plan Total Fractions Prescribed: 30
Plan Total Prescribed Dose: 60 Gy
Reference Point Dosage Given to Date: 8 Gy
Reference Point Session Dosage Given: 2 Gy
Session Number: 4

## 2022-08-09 LAB — AEROBIC/ANAEROBIC CULTURE W GRAM STAIN (SURGICAL/DEEP WOUND)

## 2022-08-09 NOTE — Therapy (Signed)
OUTPATIENT PHYSICAL THERAPY  UPPER EXTREMITY ONCOLOGY EVALUATION  Patient Name: [name] MRN: [id number] DOB:Sep 12, 1945, 77 y.o., male Today's Date: 08/10/2022  END OF SESSION:  PT End of Session - 08/10/22 1359     Visit Number 1    Number of Visits 12    Date for PT Re-Evaluation 09/21/22    PT Start Time 1400    PT Stop Time 1500    PT Time Calculation (min) 60 min    Activity Tolerance Patient tolerated treatment well    Behavior During Therapy WFL for tasks assessed/performed             Past Medical History:  Diagnosis Date   Anemia    thrombocytopenia   Atherosclerosis    BPH (benign prostatic hyperplasia)    Cardiomegaly    Cirrhosis of liver (HCC)    PT HAS HAD A LIVER TRANSPLANT-NASH, vaccinated for HepA/B on 05/31/11 afp 1.6, thrombus in splenic vein per ct   Coronary artery disease 2004   s/p CAGB   DM (diabetes mellitus) (HCC)    Erectile dysfunction    Esophageal varices (HCC)     EGD 05/13/10, three columns grade II to III EV s/p banding, second banding since 01/2010.>Portal gastropathy.. >5 banding sessions in the past.   GERD (gastroesophageal reflux disease)    Gynecomastia, male    RIGHT   History of transfusion of whole blood    Hyperlipidemia    Hypertension    Hypothyroidism    Overactive bladder    Spleen enlarged    Thrombocytopenia (HCC)    Thrombus    Chronic splenic, portal and smv   Past Surgical History:  Procedure Laterality Date   CARDIAC CATHETERIZATION  10/09/06   CATARACT EXTRACTION W/PHACO Left 04/20/2015   Procedure: CATARACT EXTRACTION PHACO AND INTRAOCULAR LENS PLACEMENT LEFT EYE CDE=14.92;  Surgeon: Gemma Payor, MD;  Location: [facility];  Service: Ophthalmology;  Laterality: Left;   COLONOSCOPY  01/2010   portal colopathy, sigmoid AVM (nonbleeding)   COLONOSCOPY WITH PROPOFOL N/A 12/21/2017   Dr. Jena Gauss: sigmoid diverticulosis, otherwise normal   CORONARY ARTERY BYPASS GRAFT  2004   3 vessels    ESOPHAGOGASTRODUODENOSCOPY  01/2010   4 COLUMNS 2-3 GRADE ESOPHAGEAL VARICES,S/P BANDING,PORTAL GASTROPATHY   ESOPHAGOGASTRODUODENOSCOPY  June 2012   Dr. Rourk-->3 columns of grade 1 esophageal varices with overlying scar, no banding treatment required, portal gastropathy. Next EGD in December 2013   ESOPHAGOGASTRODUODENOSCOPY N/A 04/16/2012   Dr. 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) 07/25/2022   Community acquired pneumonia 06/24/2020   AKI (acute kidney injury) (HCC) 06/23/2020   Type 2 diabetes mellitus (HCC) 06/23/2020   History of transplantation, liver (HCC) 05/11/2020   Chronic combined systolic and diastolic heart failure (HCC) 10/18/2019   Nonrheumatic tricuspid valve regurgitation 05/31/2019   Pain in finger 12/20/2017   PVC (premature ventricular contraction) 12/20/2017   Pain in right knee 11/17/2017   Hypertension, essential 05/02/2016   Secondary hyperparathyroidism of renal origin (HCC) 05/02/2016   Acute urinary retention 07/11/2014   Incisional hernia without obstruction or gangrene 03/27/2014   Hyperkalemia 02/03/2014   Chronic kidney disease (CKD), stage III (moderate) (HCC) 07/02/2013   Hematospermia 05/27/2013   Hematoma of rectus sheath 02/11/2013   Portal vein thrombosis  02/04/2013   Aftercare following organ transplant 01/28/2013   Immunosuppression (HCC) 01/28/2013   Hypertrophy of breast 09/18/2012   S/P CABG x 3 06/15/2012   Edema 08/08/2011   Vomiting 05/31/2011   Diarrhea 05/31/2011   Abdominal distention 05/31/2011   Thyroid disease 11/07/2008   OTITIS EXTERNA, ACUTE, LEFT 11/07/2008   SEBORRHEIC KERATOSIS 05/08/2008   Acquired trigger finger 05/11/2007   Jaundice 01/08/2007   ABDOMINAL PAIN, UPPER 01/08/2007   Actinic  keratosis 12/04/2006   ERECTILE DYSFUNCTION 04/24/2006   Hyperlipidemia 02/01/2006   ANEMIA-NOS 02/01/2006   CAD (coronary artery disease) 02/01/2006   ESOPHAGEAL VARICES 02/01/2006   HEPATIC FAILURE, END STAGE 02/01/2006   CIRRHOSIS 02/01/2006   SPLENOMEGALY 02/01/2006    PCP:   REFERRING PROVIDER: Lonie Peak, MD  REFERRING DIAG: Left axillary Mass/lymphedema  THERAPY DIAG:  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 Burning pain in arm pit. No numbness or tingling. Swelling started at same time as pain. Woke up one morning with pain under the arm. Presently having radiation to shrink the mass. Radiation scheduled to end September 15, 2022. He saw an orthopedic and had fluid under his armpit aspirated of 50CC, and [facility]  PERTINENT HISTORY:  Pt developed a painful left axillary mass in January 2024 that was  determined to be a squamous cell carcinoma with unknown primary origin. It causes pain and burning in the left UE, in addition to swelling. He is currently having pre-op radiation to decrease the size of the mass prior to surgery. Mass is presently 7.3 cm.  Pt is s/p Liver transplant 10 years ago and is on immunosupression. He also has stage III CK DM, CABG.  PAIN:  Are you having pain? Yes NPRS scale: 9/10 with pain meds Pain location: left armpit Pain orientation: Left  PAIN TYPE: burning and sharp Pain description: constant  Aggravating factors: not sure Relieving factors: pain meds a little,  nothing else eases  PRECAUTIONS: post liver transplant with immuno-suppresion 10 years ago, CKD, CABG 2004, DM, thyroid, RTC repair on left  25 yrs. ago  WEIGHT BEARING RESTRICTIONS:   FALLS:  Has patient fallen in last 6  months? Yes. Number of falls 1  LIVING ENVIRONMENT: Lives with: lives with their spouse Lives in: House/apartment   OCCUPATION: retired  LEISURE: not much since new diagnosis  HAND DOMINANCE: left   PRIOR LEVEL OF FUNCTION: Independent  PATIENT GOALS: get use and movement in arm,be able to handle daily activities,   OBJECTIVE:  COGNITION: Overall cognitive status: Within functional limits for tasks assessed   PALPATION: Firm mass palpable in left axilla  OBSERVATIONS / OTHER ASSESSMENTS: no skin irritation from radiation, shoulder is tight at end ranges of motion  SENSATION: Light touch: Appears intact   POSTURE: forward head, rounded shoulders  UPPER EXTREMITY AROM/PROM:  A/PROM RIGHT   eval   Shoulder extension 62  Shoulder flexion 135  Shoulder abduction 157  Shoulder internal rotation 62  Shoulder external rotation 80    (Blank rows = not tested)  A/PROM LEFT   eval  Shoulder extension 42  Shoulder flexion 65  Shoulder abduction 74  Shoulder internal rotation   Shoulder external rotation     (Blank rows = not tested)  CERVICAL AROM: All within functional limits:     UPPER EXTREMITY STRENGTH:   LYMPHEDEMA ASSESSMENTS:   SURGERY TYPE/DATE: pending left axillary Mass removal  NUMBER OF LYMPH NODES REMOVED: NA  CHEMOTHERAPY: NO  RADIATION:presently having radiation to shrink mass before surgery  HORMONE TREATMENT: NA  INFECTIONS: NO   LYMPHEDEMA ASSESSMENTS:   LANDMARK RIGHT  eval  At axilla  27.7  15 cm proximal to olecranon process 25.0  10 cm proximal to olecranon process 23.5  Olecranon process 24.5  15 cm proximal to ulnar styloid process 22.3  10 cm proximal to ulnar styloid process 20.0  Just  proximal to ulnar styloid process 15.7  Across hand at thumb web space 20.3  At base of 2nd digit 6.4  (Blank rows = not tested)  LANDMARK LEFT  eval  At axilla  30.7  15 cm proximal to olecranon process 28.0  10 cm proximal to olecranon process 26.4  Olecranon process 31.1  15 cm proximal to ulnar styloid process 26.8  10 cm proximal to ulnar styloid process 23.5  Just proximal to ulnar styloid process 18.7  Across hand at thumb web space 20.6  At base of 2nd digit 6.8  (Blank rows = not tested)    L-DEX LYMPHEDEMA SCREENING: The patient was assessed using the L-Dex machine today to produce a lymphedema index baseline score. The patient will be reassessed on a regular basis (typically every 3 months) to obtain new L-Dex scores. If the score is > 6.5 points away from his/her baseline score indicating onset of subclinical lymphedema, it will be recommended to wear a compression garment for 4 weeks, 12 hours per day and then be reassessed. If the score continues to be > 6.5 points from baseline at reassessment, we will initiate lymphedema treatment. Assessing in this manner has a 95% rate of preventing clinically significant lymphedema.   Lymphedema Life Impact Scale :  78%  TODAY'S TREATMENT:  DATE: 08/10/2022 PROM left shoulder flexion, scaption ,abd  and ER with multiple VC's to get pt to relax Cut small TG soft for pts left UE to see if he can tolerate mild compression. Educated pt to remove if having pain, tingling anything uncomfortable or different from what he is already experiencing.   PATIENT EDUCATION:  Education details: clasped hands flexion for ROM, wear TG soft if comfortable but remove if increased pain, numbness etc Person educated: Patient and Spouse Education method: Programmer, multimedia, Demonstration, Verbal cues, and Handouts Education  comprehension: verbalized understanding, returned demonstration, and needs further education  HOME EXERCISE PROGRAM: Supine clasped hands flexion, scapular retraction   ASSESSMENT:  CLINICAL IMPRESSION: Patient is a 77 y.o. male who was seen today for physical therapy evaluation and treatment for Left UE lymphedema due to axillary mass determined to be from a squamous cell carcinoma of unknown origin.Marland Kitchen He is having radiation presently to shrink the mass prior to surgery. He presents with significant pain in the axillary region, increased swelling throughout the left UE, and decreased ROM in the left shoulder with muscle guarding and tightness.  He will benefit from skilled therapy to progress HEP and ROM. Trial of compression bandaging to reduce swelling if pt tolerates TG soft, and education of his wife in proper way to wrap UE.   OBJECTIVE IMPAIRMENTS: decreased activity tolerance, decreased knowledge of condition, decreased ROM, decreased strength, increased edema, postural dysfunction, and pain.   ACTIVITY LIMITATIONS: carrying, lifting, sleeping, bed mobility, dressing, and reach over head  PARTICIPATION LIMITATIONS: cleaning, driving, and yard work  PERSONAL FACTORS: 3+ comorbidities: axillary tumor, prior liver transplant, DM  are also affecting patient's functional outcome.   REHAB POTENTIAL: Good  CLINICAL DECISION MAKING: Evolving/moderate complexity  EVALUATION COMPLEXITY: Low  GOALS: Goals reviewed with patient? Yes  SHORT TERM GOALS: Target date: 3 weeks  Pt will be independent in HEP for shoulder ROM Baseline: Goal status: INITIAL  2.  Pt. Will be tolerant of compression bandaging Baseline:  Goal status: INITIAL  3.  Pts wife will be educated and independent in compression bandaging to left UE Baseline:  Goal status: INITIAL  4.  Pt will have improved shoulder flexion and abduction AROM by atleast 25 degrees Baseline:  Goal status: INITIAL  5.  Pt will  have decreased edema by 1.5 cm at 10 cm prox to ulna and 10 cm prox to olecranon Baseline:  Goal status: INITIAL  LONG TERM GOALS: Target date: 6 weeks  Pt will have decreased swelling at olecranon by 3 cm Baseline:  Goal status: INITIAL  2.  Pt will be fit for compression sleeve prn and tolerated Baseline:  Goal status: INITIAL  3.  Pt will improve left shoulder flexion and abd to atleast 120 degrees Baseline:  Goal status: INITIAL  PLAN:  PT FREQUENCY: 2x/week  PT DURATION: 6 weeks  PLANNED INTERVENTIONS: Therapeutic exercises, Therapeutic activity, Patient/Family education, Self Care, Joint mobilization, Orthotic/Fit training, Compression bandaging, Manual therapy, and Re-evaluation  PLAN FOR NEXT SESSION: AAROM as tolerated, GH mobs, PROM, educate wife in compression bandaging if he tolerates TG soft, Compression garment prn Release to HEP and self management when ready  Waynette Buttery, PT 08/10/2022, 5:27 PM

## 2022-08-10 ENCOUNTER — Other Ambulatory Visit: Payer: Self-pay

## 2022-08-10 ENCOUNTER — Ambulatory Visit
Admission: RE | Admit: 2022-08-10 | Discharge: 2022-08-10 | Disposition: A | Discharge status: Home / Self Care | Payer: Medicare Other | Source: Ambulatory Visit | Attending: Radiation Oncology | Admitting: Radiation Oncology

## 2022-08-10 ENCOUNTER — Ambulatory Visit: Payer: Medicare Other | Attending: Radiation Oncology

## 2022-08-10 DIAGNOSIS — I890 Lymphedema, not elsewhere classified: Secondary | ICD-10-CM | POA: Insufficient documentation

## 2022-08-10 DIAGNOSIS — C4492 Squamous cell carcinoma of skin, unspecified: Secondary | ICD-10-CM | POA: Diagnosis present

## 2022-08-10 DIAGNOSIS — M25612 Stiffness of left shoulder, not elsewhere classified: Secondary | ICD-10-CM | POA: Insufficient documentation

## 2022-08-10 DIAGNOSIS — C773 Secondary and unspecified malignant neoplasm of axilla and upper limb lymph nodes: Secondary | ICD-10-CM | POA: Insufficient documentation

## 2022-08-10 LAB — RAD ONC ARIA SESSION SUMMARY
Course Elapsed Days: 6
Plan Fractions Treated to Date: 5
Plan Prescribed Dose Per Fraction: 2 Gy
Plan Total Fractions Prescribed: 30
Plan Total Prescribed Dose: 60 Gy
Reference Point Dosage Given to Date: 10 Gy
Reference Point Session Dosage Given: 2 Gy
Session Number: 5

## 2022-08-11 ENCOUNTER — Other Ambulatory Visit: Payer: Self-pay

## 2022-08-11 ENCOUNTER — Ambulatory Visit
Admission: RE | Admit: 2022-08-11 | Discharge: 2022-08-11 | Disposition: A | Discharge status: Home / Self Care | Payer: Medicare Other | Source: Ambulatory Visit | Attending: Radiation Oncology | Admitting: Radiation Oncology

## 2022-08-11 DIAGNOSIS — C773 Secondary and unspecified malignant neoplasm of axilla and upper limb lymph nodes: Secondary | ICD-10-CM | POA: Diagnosis not present

## 2022-08-11 LAB — RAD ONC ARIA SESSION SUMMARY
Course Elapsed Days: 7
Plan Fractions Treated to Date: 6
Plan Prescribed Dose Per Fraction: 2 Gy
Plan Total Fractions Prescribed: 30
Plan Total Prescribed Dose: 60 Gy
Reference Point Dosage Given to Date: 12 Gy
Reference Point Session Dosage Given: 2 Gy
Session Number: 6

## 2022-08-12 ENCOUNTER — Other Ambulatory Visit: Payer: Self-pay

## 2022-08-12 ENCOUNTER — Ambulatory Visit
Admission: RE | Admit: 2022-08-12 | Discharge: 2022-08-12 | Disposition: A | Discharge status: Home / Self Care | Payer: Medicare Other | Source: Ambulatory Visit | Attending: Radiation Oncology | Admitting: Radiation Oncology

## 2022-08-12 ENCOUNTER — Ambulatory Visit: Payer: Medicare Other

## 2022-08-12 DIAGNOSIS — C773 Secondary and unspecified malignant neoplasm of axilla and upper limb lymph nodes: Secondary | ICD-10-CM | POA: Diagnosis not present

## 2022-08-12 LAB — RAD ONC ARIA SESSION SUMMARY
Course Elapsed Days: 8
Plan Fractions Treated to Date: 7
Plan Prescribed Dose Per Fraction: 2 Gy
Plan Total Fractions Prescribed: 30
Plan Total Prescribed Dose: 60 Gy
Reference Point Dosage Given to Date: 14 Gy
Reference Point Session Dosage Given: 2 Gy
Session Number: 7

## 2022-08-15 ENCOUNTER — Ambulatory Visit
Admission: RE | Admit: 2022-08-15 | Discharge: 2022-08-15 | Disposition: A | Discharge status: Home / Self Care | Payer: Medicare Other | Source: Ambulatory Visit | Attending: Radiation Oncology | Admitting: Radiation Oncology

## 2022-08-15 ENCOUNTER — Other Ambulatory Visit: Payer: Self-pay

## 2022-08-15 DIAGNOSIS — C773 Secondary and unspecified malignant neoplasm of axilla and upper limb lymph nodes: Secondary | ICD-10-CM | POA: Diagnosis not present

## 2022-08-15 LAB — RAD ONC ARIA SESSION SUMMARY
Course Elapsed Days: 11
Plan Fractions Treated to Date: 8
Plan Prescribed Dose Per Fraction: 2 Gy
Plan Total Fractions Prescribed: 30
Plan Total Prescribed Dose: 60 Gy
Reference Point Dosage Given to Date: 16 Gy
Reference Point Session Dosage Given: 2 Gy
Session Number: 8

## 2022-08-16 ENCOUNTER — Ambulatory Visit
Admission: RE | Admit: 2022-08-16 | Discharge: 2022-08-16 | Disposition: A | Discharge status: Home / Self Care | Payer: Medicare Other | Source: Ambulatory Visit | Attending: Radiation Oncology | Admitting: Radiation Oncology

## 2022-08-16 ENCOUNTER — Other Ambulatory Visit: Payer: Self-pay

## 2022-08-16 DIAGNOSIS — C773 Secondary and unspecified malignant neoplasm of axilla and upper limb lymph nodes: Secondary | ICD-10-CM | POA: Diagnosis not present

## 2022-08-16 LAB — RAD ONC ARIA SESSION SUMMARY
Course Elapsed Days: 12
Plan Fractions Treated to Date: 9
Plan Prescribed Dose Per Fraction: 2 Gy
Plan Total Fractions Prescribed: 30
Plan Total Prescribed Dose: 60 Gy
Reference Point Dosage Given to Date: 18 Gy
Reference Point Session Dosage Given: 2 Gy
Session Number: 9

## 2022-08-17 ENCOUNTER — Telehealth: Payer: Self-pay

## 2022-08-17 ENCOUNTER — Ambulatory Visit: Payer: Medicare Other

## 2022-08-17 ENCOUNTER — Other Ambulatory Visit: Payer: Self-pay

## 2022-08-17 ENCOUNTER — Ambulatory Visit
Admission: RE | Admit: 2022-08-17 | Discharge: 2022-08-17 | Disposition: A | Discharge status: Home / Self Care | Payer: Medicare Other | Source: Ambulatory Visit | Attending: Radiation Oncology | Admitting: Radiation Oncology

## 2022-08-17 DIAGNOSIS — C4492 Squamous cell carcinoma of skin, unspecified: Secondary | ICD-10-CM

## 2022-08-17 DIAGNOSIS — C773 Secondary and unspecified malignant neoplasm of axilla and upper limb lymph nodes: Secondary | ICD-10-CM

## 2022-08-17 DIAGNOSIS — M25612 Stiffness of left shoulder, not elsewhere classified: Secondary | ICD-10-CM

## 2022-08-17 DIAGNOSIS — I890 Lymphedema, not elsewhere classified: Secondary | ICD-10-CM

## 2022-08-17 LAB — RAD ONC ARIA SESSION SUMMARY
Course Elapsed Days: 13
Plan Fractions Treated to Date: 10
Plan Prescribed Dose Per Fraction: 2 Gy
Plan Total Fractions Prescribed: 30
Plan Total Prescribed Dose: 60 Gy
Reference Point Dosage Given to Date: 20 Gy
Reference Point Session Dosage Given: 2 Gy
Session Number: 10

## 2022-08-17 NOTE — Telephone Encounter (Signed)
T/C from pt's wife inquiring about pt's thyroid biopsy and the reason for scheduling and cancellation  Wife advised,confirmed with Dr Arita Miss office and Karena Addison, according to the thyroid US a thyroid biopsy was not a recommendation and he just needs to f/up in one yr with his PCP for repeat imaging.  Wife agreed to this plan  She also requested a refill for his oxycodone 10 mg be sent to [facility]. Refill sent by Georga Kaufmann, PA

## 2022-08-17 NOTE — Patient Instructions (Addendum)
Self bandaging the arm:  ? ?Follow along with this video: ? ?Https://[email or web address]/[email or web address] ? ?-OR- ? ?Search in your internet browser: "self bandaging arm MD Anderson" ?  ?And the video should appear in the video search results ?"Lymphedema Management: Self bandaging your arm" on YouTube ? ? ?This video is for caregiver assistance: ? ?https://www.youtube.com/watch?v=[email or web address] ? ?-OR- ? ?Enter " Lymphedema Management: Caregiver Bandaging you arm" into your search browser and your video should appear in the results ?PLEASE KEEP YOUR BANDAGES ON AS LONG AS POSSIBLE TO GET THE BEST SWELLING REDUCTION. ?Should your bandages become uncomfortable or feel too tight, follow these steps: ?Elevate your extremity higher than your heart.  ?Try to move your arm or leg joints against the firmness of the bandage to help with moving the fluid and allow the bandages to loosen a bit.  ?If the bandaging is still is too tight, it is ok to carefully remove the top layer.  There will still be more layers under it that can provide compression to your extremity. ?Finally, if you STILL have significant pain after trying these steps, it is ok to take the bandage off.  Check your skin carefully for any signs of irritation  ?PLEASE bring ALL bandage materials back to your next appointment as we will reuse what we can ?TAKE CARE OF YOUR BANDAGES SO THEY WILL LAST LONGER AND STAY IN BETTER CONDITION ?Washing bandages:  Wash periodically using a mild detergent in warm water.  Do not use fabric softener or bleach.  Place bandages in a mesh lingerie bag or in a tied off pillow case and use the gentle cycle of the washing machine or hand wash. If you hand wash, you may want to put them in the spin cycle of your washer to get the extra water out, but make sure you put them in a mesh bag first. Do not wring or stretch them while they are wet.  ?Drying bandages: Lay the bandages out smoothly on a towel away from direct sunlight or  heating sources that can damage the fabric. Rolling bandages in a towel and gently squeezing the towel to remove excess water before laying them out can speed up the process.  If you use a drying rack, place a towel on top of the rack to lay the bandages on.  If they hang down to dry, they fabric could be stretched out and the bandage will lose its compression.   Or, keep bandages in the mesh bag and dry them in the dryer on the low or no heat cycle. ?Rolling bandages: Please roll your bandages after drying them so they are ready for your next treatment. If they are rolled too loose, they will be difficult to apply.  If rolled too tight, they can get stretched out.  ? ?TAKE CARE OF YOUR SKIN ?Apply a low pH moisturizing lotion to your skin daily ?Avoid scratching your skin ?Treat skin irritations quickly  ?Know the 5 warning signs of infection: redness, pain, warmth to touch, fever and increased swelling.  Call your physician immediately if you notice any of these signs of a possible infection. ? ? ?

## 2022-08-17 NOTE — Therapy (Signed)
OUTPATIENT PHYSICAL THERAPY  UPPER EXTREMITY ONCOLOGY TREATMENT  Patient Name: [name] MRN: [id number] DOB:November 09, 1945, 77 y.o., male Today's Date: 08/17/2022  END OF SESSION:  PT End of Session - 08/17/22 1003     Visit Number 2    Number of Visits 12    Date for PT Re-Evaluation 09/21/22    PT Start Time 1004    PT Stop Time 1100    PT Time Calculation (min) 56 min    Activity Tolerance Patient tolerated treatment well    Behavior During Therapy WFL for tasks assessed/performed             Past Medical History:  Diagnosis Date   Anemia    thrombocytopenia   Atherosclerosis    BPH (benign prostatic hyperplasia)    Cardiomegaly    Cirrhosis of liver (HCC)    PT HAS HAD A LIVER TRANSPLANT-NASH, vaccinated for HepA/B on 05/31/11 afp 1.6, thrombus in splenic vein per ct   Coronary artery disease 2004   s/p CAGB   DM (diabetes mellitus) (HCC)    Erectile dysfunction    Esophageal varices (HCC)     EGD 05/13/10, three columns grade II to III EV s/p banding, second banding since 01/2010.>Portal gastropathy.. >5 banding sessions in the past.   GERD (gastroesophageal reflux disease)    Gynecomastia, male    RIGHT   History of transfusion of whole blood    Hyperlipidemia    Hypertension    Hypothyroidism    Overactive bladder    Spleen enlarged    Thrombocytopenia (HCC)    Thrombus    Chronic splenic, portal and smv   Past Surgical History:  Procedure Laterality Date   CARDIAC CATHETERIZATION  10/09/06   CATARACT EXTRACTION W/PHACO Left 04/20/2015   Procedure: CATARACT EXTRACTION PHACO AND INTRAOCULAR LENS PLACEMENT LEFT EYE CDE=14.92;  Surgeon: Gemma Payor, MD;  Location: [facility];  Service: Ophthalmology;  Laterality: Left;   COLONOSCOPY  01/2010   portal colopathy, sigmoid AVM (nonbleeding)   COLONOSCOPY WITH PROPOFOL N/A 12/21/2017   Dr. Jena Gauss: sigmoid diverticulosis, otherwise normal   CORONARY ARTERY BYPASS GRAFT  2004   3 vessels    ESOPHAGOGASTRODUODENOSCOPY  01/2010   4 COLUMNS 2-3 GRADE ESOPHAGEAL VARICES,S/P BANDING,PORTAL GASTROPATHY   ESOPHAGOGASTRODUODENOSCOPY  June 2012   Dr. Rourk-->3 columns of grade 1 esophageal varices with overlying scar, no banding treatment required, portal gastropathy. Next EGD in December 2013   ESOPHAGOGASTRODUODENOSCOPY N/A 04/16/2012   Dr. 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) 07/25/2022   Community acquired pneumonia 06/24/2020   AKI (acute kidney injury) (HCC) 06/23/2020   Type 2 diabetes mellitus (HCC) 06/23/2020   History of transplantation, liver (HCC) 05/11/2020   Chronic combined systolic and diastolic heart failure (HCC) 10/18/2019   Nonrheumatic tricuspid valve regurgitation 05/31/2019   Pain in finger 12/20/2017   PVC (premature ventricular contraction) 12/20/2017   Pain in right knee 11/17/2017   Hypertension, essential 05/02/2016   Secondary hyperparathyroidism of renal origin (HCC) 05/02/2016   Acute urinary retention 07/11/2014   Incisional hernia without obstruction or gangrene 03/27/2014   Hyperkalemia 02/03/2014   Chronic kidney disease (CKD), stage III (moderate) (HCC) 07/02/2013   Hematospermia 05/27/2013   Hematoma of rectus sheath 02/11/2013   Portal vein thrombosis  02/04/2013   Aftercare following organ transplant 01/28/2013   Immunosuppression (HCC) 01/28/2013   Hypertrophy of breast 09/18/2012   S/P CABG x 3 06/15/2012   Edema 08/08/2011   Vomiting 05/31/2011   Diarrhea 05/31/2011   Abdominal distention 05/31/2011   Thyroid disease 11/07/2008   OTITIS EXTERNA, ACUTE, LEFT 11/07/2008   SEBORRHEIC KERATOSIS 05/08/2008   Acquired trigger finger 05/11/2007   Jaundice 01/08/2007   ABDOMINAL PAIN, UPPER 01/08/2007   Actinic  keratosis 12/04/2006   ERECTILE DYSFUNCTION 04/24/2006   Hyperlipidemia 02/01/2006   ANEMIA-NOS 02/01/2006   CAD (coronary artery disease) 02/01/2006   ESOPHAGEAL VARICES 02/01/2006   HEPATIC FAILURE, END STAGE 02/01/2006   CIRRHOSIS 02/01/2006   SPLENOMEGALY 02/01/2006    PCP:   REFERRING PROVIDER: Lonie Peak, MD  REFERRING DIAG: Left axillary Mass/lymphedema  THERAPY DIAG:  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 My skin is still good. I tried the exercises that you gave me but I don't see any change. I didn't do them a lot. I wore the sleeve you gave me but I lost it. It didn't do anything.   EVAL Started with swelling and pain in left axillary region when he retired. Burning pain in arm pit. No numbness or tingling. Swelling started at same time as pain. Woke up one morning with pain under the arm. Presently having radiation to shrink the mass. Radiation scheduled to end September 15, 2022. He saw an orthopedic and had fluid under his armpit aspirated of 50CC, and [facility]  PERTINENT HISTORY:  Pt developed a painful left axillary mass in January 2024 that was  determined to be a squamous cell carcinoma with unknown primary origin. It causes pain and burning in the left UE, in addition to swelling. He is currently having pre-op radiation to decrease the size of the mass prior to surgery. Mass is presently 7.3 cm.  Pt is s/p Liver transplant 10 years ago and is on  immunosupression. He also has stage III CK DM, CABG.  PAIN:  Are you having pain? Yes NPRS scale: 9/10 with pain meds Pain location: left armpit Pain orientation: Left  PAIN TYPE: burning and sharp Pain description: constant  Aggravating factors: not sure Relieving factors: pain meds a little, nothing else eases  PRECAUTIONS: post liver transplant with immuno-suppresion 10 years ago, CKD, CABG 2004, DM, thyroid, RTC repair on left  25 yrs. ago  WEIGHT BEARING RESTRICTIONS:   FALLS:  Has patient fallen in last 6 months? Yes. Number of falls 1  LIVING ENVIRONMENT: Lives with: lives with their spouse Lives in: House/apartment   OCCUPATION: retired  LEISURE: not much since new diagnosis  HAND DOMINANCE: left   PRIOR LEVEL OF FUNCTION: Independent  PATIENT GOALS: get use and movement in arm,be able to handle daily activities,   OBJECTIVE:  COGNITION: Overall cognitive status: Within functional limits for tasks assessed   PALPATION: Firm mass palpable in left axilla  OBSERVATIONS / OTHER ASSESSMENTS: no skin irritation from radiation, shoulder is tight at end ranges of motion  SENSATION: Light touch: Appears intact   POSTURE: forward head, rounded shoulders  UPPER EXTREMITY AROM/PROM:  A/PROM RIGHT   eval   Shoulder extension 62  Shoulder flexion 135  Shoulder abduction 157  Shoulder internal rotation 62  Shoulder external rotation 80    (Blank rows = not tested)  A/PROM LEFT   eval  Shoulder extension 42  Shoulder flexion 65  Shoulder abduction 74  Shoulder internal rotation   Shoulder external rotation     (Blank rows = not tested)  CERVICAL AROM: All within functional limits:     UPPER EXTREMITY STRENGTH:   LYMPHEDEMA ASSESSMENTS:   SURGERY TYPE/DATE: pending left axillary Mass removal  NUMBER OF LYMPH NODES REMOVED: NA  CHEMOTHERAPY: NO  RADIATION:presently having radiation to shrink mass before surgery  HORMONE TREATMENT:  NA  INFECTIONS: NO   LYMPHEDEMA ASSESSMENTS:   LANDMARK RIGHT  eval  At axilla  27.7  15 cm proximal to olecranon process 25.0  10 cm proximal to olecranon process 23.5  Olecranon process 24.5  15 cm proximal to ulnar styloid process 22.3  10 cm proximal to ulnar styloid process 20.0  Just proximal to ulnar styloid process 15.7  Across hand at thumb web space 20.3  At base of 2nd digit 6.4  (Blank rows = not tested)  LANDMARK LEFT  eval  At axilla  30.7  15 cm proximal to olecranon process 28.0  10 cm proximal to olecranon process 26.4  Olecranon process 31.1  15 cm proximal to ulnar styloid process 26.8  10 cm proximal to ulnar styloid process 23.5  Just proximal to ulnar styloid process 18.7  Across hand at thumb web space 20.6  At base of 2nd digit 6.8  (Blank rows = not tested)    L-DEX LYMPHEDEMA SCREENING: The patient was assessed using the L-Dex machine today to produce a lymphedema index baseline score. The patient will be reassessed on a regular basis (typically every 3 months) to obtain new L-Dex scores. If the score is > 6.5 points away from his/her baseline score indicating onset of subclinical lymphedema, it will be recommended to wear a compression garment for 4 weeks, 12 hours per day and then be reassessed. If the score continues to be > 6.5 points from baseline at reassessment, we will initiate lymphedema treatment. Assessing in this manner has a 95% rate of preventing clinically significant lymphedema.   Lymphedema Life Impact Scale :  78%  TODAY'S TREATMENT:    08/17/22 Pts wife instructed in compression bandaging with therapist performing the first finger wrap, hand wrap and arm wrap and  then removing each and having his wife perform. Small TG soft cut for Left UE, Finger wrap to fingers 1-5 with Mollelast  under TG soft with TG pulled over. Artiflex wrist to axilla. 6 cm Comprilan wrap at hand, 2 12 cm wraps wrist to axilla. Pts wife did an excellent  job with occasional VC's throughout. Pt was shown how to exercise his wrist and elbow and hand to loosen the wraps. Printed video for wrapping technique, and gave bandaging instructions with pictures, and verbage when to remove wraps etc. Discussed removing wraps if pt has increased pain, numbness etc. Also suggested she can remove top wrap if it bothers him first to see if he can accommodate to it.  She will rewrap prn every day or 2 unless pt has poor tolerance or arm does not seem to be improving at all.                                                                                                                                           DATE: 08/10/2022 PROM left shoulder flexion, scaption ,abd  and ER with multiple VC's to get pt to relax Cut small TG soft for pts left UE to see if he can tolerate mild compression. Educated pt to remove if having pain, tingling anything uncomfortable or different from what he is already experiencing.   PATIENT EDUCATION:  Education details: clasped hands flexion for ROM, wear TG soft if comfortable but remove if increased pain, numbness etc Person educated: Patient and Spouse Education method: Programmer, multimedia, Demonstration, Verbal cues, and Handouts Education comprehension: verbalized understanding, returned demonstration, and needs further education  HOME EXERCISE PROGRAM: Supine clasped hands flexion, scapular retraction   ASSESSMENT:  CLINICAL IMPRESSION:  Pts wife did an excellent job with compression bandaging with only occasional to min VC's. Pt had no increased pain after wrapping, but felt wrap was heavy. Gave written and illustrated instructions with wrapping and also advised that bandages must be removed for any increase in pain, numbness etc. Also discussed removing the top wrap prn for pt comfort. Advised pt to continue with his exercises for his shoulder. Also advised to see how his arm looks when the wrap is removed in a day or  2.   OBJECTIVE IMPAIRMENTS: decreased activity tolerance, decreased knowledge of condition, decreased ROM, decreased strength, increased edema, postural dysfunction, and pain.   ACTIVITY LIMITATIONS: carrying, lifting, sleeping, bed mobility, dressing, and reach over head  PARTICIPATION LIMITATIONS: cleaning, driving, and yard work  PERSONAL FACTORS: 3+ comorbidities: axillary tumor, prior liver transplant, DM  are also affecting patient's functional outcome.   REHAB POTENTIAL: Good  CLINICAL DECISION MAKING: Evolving/moderate complexity  EVALUATION COMPLEXITY: Low  GOALS: Goals reviewed with patient? Yes  SHORT TERM GOALS: Target date: 3 weeks  Pt will be independent in HEP for shoulder ROM Baseline: Goal status: INITIAL  2.  Pt. Will be  tolerant of compression bandaging Baseline:  Goal status: INITIAL  3.  Pts wife will be educated and independent in compression bandaging to left UE Baseline:  Goal status: INITIAL  4.  Pt will have improved shoulder flexion and abduction AROM by atleast 25 degrees Baseline:  Goal status: INITIAL  5.  Pt will have decreased edema by 1.5 cm at 10 cm prox to ulna and 10 cm prox to olecranon Baseline:  Goal status: INITIAL  LONG TERM GOALS: Target date: 6 weeks  Pt will have decreased swelling at olecranon by 3 cm Baseline:  Goal status: INITIAL  2.  Pt will be fit for compression sleeve prn and tolerated Baseline:  Goal status: INITIAL  3.  Pt will improve left shoulder flexion and abd to atleast 120 degrees Baseline:  Goal status: INITIAL  PLAN:  PT FREQUENCY: 2x/week  PT DURATION: 6 weeks  PLANNED INTERVENTIONS: Therapeutic exercises, Therapeutic activity, Patient/Family education, Self Care, Joint mobilization, Orthotic/Fit training, Compression bandaging, Manual therapy, and Re-evaluation  PLAN FOR NEXT SESSION: AAROM as tolerated, GH mobs, PROM, review with wife  compression bandaging if he tolerates , Compression  garment prn if he responds to wrapping. No MLD Release to HEP and self management when ready  Waynette Buttery, PT 08/17/2022, 1:06 PM

## 2022-08-18 ENCOUNTER — Other Ambulatory Visit: Payer: Self-pay

## 2022-08-18 ENCOUNTER — Ambulatory Visit
Admission: RE | Admit: 2022-08-18 | Discharge: 2022-08-18 | Disposition: A | Discharge status: Home / Self Care | Payer: Medicare Other | Source: Ambulatory Visit | Attending: Radiation Oncology | Admitting: Radiation Oncology

## 2022-08-18 DIAGNOSIS — C773 Secondary and unspecified malignant neoplasm of axilla and upper limb lymph nodes: Secondary | ICD-10-CM | POA: Diagnosis not present

## 2022-08-18 LAB — RAD ONC ARIA SESSION SUMMARY
Course Elapsed Days: 14
Plan Fractions Treated to Date: 11
Plan Prescribed Dose Per Fraction: 2 Gy
Plan Total Fractions Prescribed: 30
Plan Total Prescribed Dose: 60 Gy
Reference Point Dosage Given to Date: 22 Gy
Reference Point Session Dosage Given: 2 Gy
Session Number: 11

## 2022-08-19 ENCOUNTER — Ambulatory Visit
Admission: RE | Admit: 2022-08-19 | Discharge: 2022-08-19 | Disposition: A | Discharge status: Home / Self Care | Payer: Medicare Other | Source: Ambulatory Visit | Attending: Radiation Oncology | Admitting: Radiation Oncology

## 2022-08-19 ENCOUNTER — Other Ambulatory Visit: Payer: Self-pay

## 2022-08-19 DIAGNOSIS — C773 Secondary and unspecified malignant neoplasm of axilla and upper limb lymph nodes: Secondary | ICD-10-CM | POA: Diagnosis not present

## 2022-08-19 LAB — RAD ONC ARIA SESSION SUMMARY
Course Elapsed Days: 15
Plan Fractions Treated to Date: 12
Plan Prescribed Dose Per Fraction: 2 Gy
Plan Total Fractions Prescribed: 30
Plan Total Prescribed Dose: 60 Gy
Reference Point Dosage Given to Date: 24 Gy
Reference Point Session Dosage Given: 2 Gy
Session Number: 12

## 2022-08-20 ENCOUNTER — Other Ambulatory Visit: Payer: Self-pay

## 2022-08-20 ENCOUNTER — Emergency Department (HOSPITAL_COMMUNITY)
Admission: EM | Admit: 2022-08-20 | Discharge: 2022-08-20 | Disposition: A | Discharge status: Home / Self Care | Payer: Medicare Other | Attending: Emergency Medicine | Admitting: Emergency Medicine

## 2022-08-20 ENCOUNTER — Encounter (HOSPITAL_COMMUNITY): Payer: Self-pay | Admitting: Emergency Medicine

## 2022-08-20 ENCOUNTER — Emergency Department (HOSPITAL_COMMUNITY): Payer: Medicare Other

## 2022-08-20 DIAGNOSIS — I509 Heart failure, unspecified: Secondary | ICD-10-CM | POA: Diagnosis not present

## 2022-08-20 DIAGNOSIS — N189 Chronic kidney disease, unspecified: Secondary | ICD-10-CM | POA: Insufficient documentation

## 2022-08-20 DIAGNOSIS — E1122 Type 2 diabetes mellitus with diabetic chronic kidney disease: Secondary | ICD-10-CM | POA: Diagnosis not present

## 2022-08-20 DIAGNOSIS — I2510 Atherosclerotic heart disease of native coronary artery without angina pectoris: Secondary | ICD-10-CM | POA: Insufficient documentation

## 2022-08-20 DIAGNOSIS — Z794 Long term (current) use of insulin: Secondary | ICD-10-CM | POA: Insufficient documentation

## 2022-08-20 DIAGNOSIS — D649 Anemia, unspecified: Secondary | ICD-10-CM | POA: Diagnosis not present

## 2022-08-20 DIAGNOSIS — E871 Hypo-osmolality and hyponatremia: Secondary | ICD-10-CM | POA: Diagnosis not present

## 2022-08-20 DIAGNOSIS — K5641 Fecal impaction: Secondary | ICD-10-CM | POA: Insufficient documentation

## 2022-08-20 DIAGNOSIS — Z7982 Long term (current) use of aspirin: Secondary | ICD-10-CM | POA: Diagnosis not present

## 2022-08-20 LAB — COMPREHENSIVE METABOLIC PANEL
ALT: 9 U/L (ref 0–44)
AST: 18 U/L (ref 15–41)
Albumin: 2.9 g/dL — ABNORMAL LOW (ref 3.5–5.0)
Alkaline Phosphatase: 56 U/L (ref 38–126)
Anion gap: 13 (ref 5–15)
BUN: 34 mg/dL — ABNORMAL HIGH (ref 8–23)
CO2: 24 mmol/L (ref 22–32)
Calcium: 9 mg/dL (ref 8.9–10.3)
Chloride: 89 mmol/L — ABNORMAL LOW (ref 98–111)
Creatinine, Ser: 1.8 mg/dL — ABNORMAL HIGH (ref 0.61–1.24)
GFR, Estimated: 38 mL/min — ABNORMAL LOW (ref >60)
Glucose, Bld: 121 mg/dL — ABNORMAL HIGH (ref 70–99)
Potassium: 4.1 mmol/L (ref 3.5–5.1)
Sodium: 126 mmol/L — ABNORMAL LOW (ref 135–145)
Total Bilirubin: 2.5 mg/dL — ABNORMAL HIGH (ref 0.3–1.2)
Total Protein: 7.3 g/dL (ref 6.5–8.1)

## 2022-08-20 LAB — CBC
HCT: 32.4 % — ABNORMAL LOW (ref 39.0–52.0)
Hemoglobin: 10.9 g/dL — ABNORMAL LOW (ref 13.0–17.0)
MCH: 30.3 pg (ref 26.0–34.0)
MCHC: 33.6 g/dL (ref 30.0–36.0)
MCV: 90 fL (ref 80.0–100.0)
Platelets: 281 10*3/uL (ref 150–400)
RBC: 3.6 MIL/uL — ABNORMAL LOW (ref 4.22–5.81)
RDW: 13.4 % (ref 11.5–15.5)
WBC: 8.7 10*3/uL (ref 4.0–10.5)
nRBC: 0 % (ref 0.0–0.2)

## 2022-08-20 LAB — TYPE AND SCREEN
ABO/RH(D): O POS
Antibody Screen: NEGATIVE

## 2022-08-20 LAB — PROTIME-INR
INR: 1.1 (ref 0.8–1.2)
Prothrombin Time: 14.5 seconds (ref 11.4–15.2)

## 2022-08-20 MED ORDER — SODIUM CHLORIDE 0.9 % IV BOLUS
1000.0000 mL | Freq: Once | INTRAVENOUS | Status: AC
  Administered 2022-08-20: 1000 mL via INTRAVENOUS

## 2022-08-20 NOTE — ED Provider Notes (Signed)
[place] EMERGENCY DEPARTMENT AT [facility] Provider Note   CSN: [id number] Arrival date & time: 08/20/22  1800     History Chief Complaint  Patient presents with   Foreign Body in Rectum    [name] is a 77 y.o. male with reported h/o hyperlipidemia, CAD, CHF, CKD, diabetes, currently undergoing treatment for lymphoma presents to the ER for evaluation of FBS in the anus. The patient reports that he has been constipated over the past few days and hasn't been able to produce a BM. He is still able to pass gas however. He went to use a liquid glycerin suppository while he was on the toilet. He reports that he doesn't know if the suppository fell in the toilet or if it fully went into his rectum. His wife reports that she looked and the orange cap was in his rectum. Because of his FBS and some small bleeding, his wife wanted to bring him into the ER. His wife reports that he is on a bowel regimen but doesn't like to take it. She reports that the medication that he is on causes him to have a decrease appetite and he has been having decreased PO intake, although this is not a new problem. He currently denies any fever, abdominal pain, nausea, or vomiting.     Foreign Body in Rectum Pertinent negatives include no chest pain, no abdominal pain and no shortness of breath.       Home Medications Prior to Admission medications   Medication Sig Start Date End Date Taking? Authorizing Provider  aspirin 81 MG tablet Take 81 mg by mouth daily.    [provider]  atorvastatin (LIPITOR) 20 MG tablet Take 20 mg by mouth at bedtime.    [provider]  Calcium Carbonate-Vitamin D (CALCIUM 600+D PO) Take 1 tablet by mouth daily.    [provider]  carvedilol (COREG) 6.25 MG tablet Take 6.25 mg by mouth at bedtime.     [provider]  cycloSPORINE modified (NEORAL) 100 MG capsule Take 200-300 mg by mouth See admin instructions. Take 300mg  by  mouth in the morning and 200 mg in the evening    [provider]  hydrochlorothiazide (HYDRODIURIL) 25 MG tablet Take 25 mg by mouth daily.    [provider]  insulin glargine (LANTUS) 100 UNIT/ML injection Inject 14 Units into the skin daily.    [provider]  levothyroxine (SYNTHROID, LEVOTHROID) 125 MCG tablet Take 125 mcg by mouth daily before breakfast.    [provider]  Magnesium Oxide (MAGOX 400 PO) Take 1,200 mg by mouth daily.     [provider]  ondansetron (ZOFRAN) 8 MG tablet Take 1 tablet (8 mg total) by mouth every 8 (eight) hours as needed for nausea or vomiting. 06/15/22   [name], PA-C  oxyCODONE 10 MG TABS Take 1 tablet (10 mg total) by mouth every 6 (six) hours as needed for severe pain. 07/04/22   [name], PA-C      Allergies    Patient has no known allergies.    Review of Systems   Review of Systems  Constitutional:  Negative for chills and fever.  Respiratory:  Negative for shortness of breath.   Cardiovascular:  Negative for chest pain.  Gastrointestinal:  Positive for anal bleeding, constipation and rectal pain. Negative for abdominal pain, nausea and vomiting.  Genitourinary:  Negative for dysuria and hematuria.    Physical Exam Updated Vital  Signs BP 138/71 (BP Location: Right Arm)   Pulse 82   Temp 98.1 F (36.7 C) (Oral)   Resp 16   Ht 5\' 6"  (1.676 m)   Wt 75.8 kg   SpO2 95%   BMI 26.95 kg/m  Physical Exam Vitals and nursing note reviewed. Exam conducted with a chaperone present ([place], Charity fundraiser).  Constitutional:      General: He is not in acute distress.    Appearance: Normal appearance. He is not toxic-appearing.  Eyes:     General: No scleral icterus. Cardiovascular:     Rate and Rhythm: Normal rate.  Pulmonary:     Effort: Pulmonary effort is normal. No respiratory distress.  Abdominal:     General: Bowel sounds are normal.     Palpations: Abdomen is soft.     Tenderness: There  is no abdominal tenderness. There is no guarding or rebound.  Genitourinary:      Comments: Small hemorrhoid a the marked area. Small amount of blood expressed from it. No brisk bleeding. The patient had a large ball of stool in the rectal vault. After removal of stool, I do not appreciated any rectal foreign body.   Musculoskeletal:     Right lower leg: No edema.     Left lower leg: No edema.  Skin:    General: Skin is dry.     Findings: No rash.  Neurological:     General: No focal deficit present.     Mental Status: He is alert. Mental status is at baseline.     ED Results / Procedures / Treatments   Labs (all labs ordered are listed, but only abnormal results are displayed) Labs Reviewed  COMPREHENSIVE METABOLIC PANEL - Abnormal; Notable for the following components:      Result Value   Sodium 126 (*)    Chloride 89 (*)    Glucose, Bld 121 (*)    BUN 34 (*)    Creatinine, Ser 1.80 (*)    Albumin 2.9 (*)    Total Bilirubin 2.5 (*)    GFR, Estimated 38 (*)    All other components within normal limits  CBC - Abnormal; Notable for the following components:   RBC 3.60 (*)    Hemoglobin 10.9 (*)    HCT 32.4 (*)    All other components within normal limits  PROTIME-INR  TYPE AND SCREEN    EKG None  Radiology DG Pelvis Portable  Result Date: 08/20/2022 CLINICAL DATA:  Foreign body in the rectum. EXAM: PORTABLE PELVIS 1-2 VIEWS COMPARISON:  None Available. FINDINGS: No radiopaque foreign object identified. There is moderate stool in the rectal vault. The osseous structures are intact. Advanced aortoiliac atherosclerotic disease. IMPRESSION: 1. No radiopaque foreign object identified. 2. Moderate stool in the rectal vault. Electronically Signed   By: Elgie Collard M.D.   On: 08/20/2022 18:53    Procedures Fecal disimpaction  Date/Time: 08/24/2022 11:28 AM  Performed by: Achille Rich, PA-C Authorized by: Achille Rich, PA-C  Consent: Verbal consent obtained. Risks  and benefits: risks, benefits and alternatives were discussed Consent given by: patient Patient understanding: patient states understanding of the procedure being performed Patient identity confirmed: verbally with patient Comments: DRE was performed for possible foreign body. The patient had a large ball of stool in the rectal vault. I was able to break some of the stool apart with my finger and remove small pieces of light brown colored stool. I do not appreciate any foreign body in the rectal  vault. He does have a small external hemorrhoid that has some bleeding. No brisk bleeding.       Medications Ordered in ED Medications  sodium chloride 0.9 % bolus 1,000 mL (1,000 mLs Intravenous New Bag/Given 08/20/22 2213)    ED Course/ Medical Decision Making/ A&P                           Medical Decision Making Amount and/or Complexity of Data Reviewed Labs: ordered. Radiology: ordered.   77 y.o. male presents to the ER for evaluation of FBS in rectum. Differential diagnosis includes but is not limited to foreign body, hemorrhoid, fecal impaction. Vital signs unremarkable. Physical exam as noted above.   Workup initiated in triage by nursing staff.   DG pelvis shows  1. No radiopaque foreign object identified. 2. Moderate stool in the rectal vault.   I independently reviewed and interpreted the patient's labs. CBC shows mild anemia at 10.9. Labs on 07/07/22 shows hemoglobin at 12.5. Likely has some down trend from current cancer treatment or anemia of chronic disease. PT INR within normal limits. CMP shows hyponatremia at 126. It appears that the patient's baseline is 134.  Decrease in chloride to 89.  Glucose at 129.  Creatinine at 1.80 which is improved from previous.  BUN elevated at 34.  Albumin at 2.9.  Total bili elevated 2.5.  On my DRE, the patient has a small external hemorrhoid noted the the 10 o clock position with posterior view. Smaller than pea sized. With initial insertion of  my finger, I was met with a large stool ball. I was able to break apart the large stool ball and remove pieces of light brown stool. Once the rectal vault was clear, I was unable to feel any foreign body. It is possible that the patient didn't take the cap off and the enema fell into the toilet since only the orange cap was visible from his rectum per the patient's wife when she removed it.   On re-evaluation, the patient reports that he was able to have several large bowel movements and is feeling much better without FBS sensation. Initially had some blood on the toilet paper with wiping but that has subsided.   Discussed the hyponatremia and possible foreign body with my attending. The patient reports that he is feeling better and would like to go home. He does have new hyponatremia from 134 to 126. I discussed with my attending who recommended NS bolus and can be discharged home. Likely from dehydration from poor PO intake. I discussed potential CT imaging with the patient and family at bedside, but the patient reports that he feels much better after the disimpaction and the multiple bowel movements he has had and no longer has the FBS and would like to go home. Per my attending, since the patient was feeling much better after disimpaction and multiple bowel movements, as well as the FBS sensation is no longer present, he can be discharged without CT imaging. The patient is amenable to staying for some IVF, but would ultimately like to go home.   We discussed the results of the labs/imaging. The plan is follow up with PCP about hyponatremia with recheck in a few days and bowel regimen given his chronic opioid use. We discussed strict return precautions and red flag symptoms. The patient verbalized their understanding and agrees to the plan. The patient is stable and being discharged home in good condition.  Portions  of this report may have been transcribed using voice recognition software. Every effort  was made to ensure accuracy; however, inadvertent computerized transcription errors may be present.   I discussed this case with my attending physician who cosigned this note including patient's presenting symptoms, physical exam, and planned diagnostics and interventions. Attending physician stated agreement with plan or made changes to plan which were implemented.   Final Clinical Impression(s) / ED Diagnoses Final diagnoses:  Hyponatremia  Fecal impaction [facility])    Rx / DC Orders ED Discharge Orders     None         Achille Rich, Cordelia Poche 08/24/22 1228    Bethann Berkshire, MD 08/26/22 1206

## 2022-08-20 NOTE — Discharge Instructions (Addendum)
You were seen in the ER today for evaluation of possible foreign body in your rectum. I think what you were likely feeling was the stool ball in your rectum. I am glad that you are feeling better now. Please follow up with your PCP regarding your constipation and lower sodium within the next few days for them to re-evaluate your values. Please make sure you stick with the bowel regimen given to your by your providers. Make sure that you are staying well hydrated. If you have any concerns, new or worsening symptoms, please return to the ER for re-evaluation.   Contact a health care provider if you: Have ongoing pain in your rectum. Need to use an enema or a suppository more than 2 times a week. Have rectal bleeding. Continue to have problems. The problems may include not being able to go to the bathroom and having long-term constipation. Have pain in your abdomen. Have thin, pencil-like stools. Get help right away if: You have black or tarry stools.

## 2022-08-20 NOTE — ED Triage Notes (Signed)
Pt via POV reporting foreign body in rectum. He states "I think I might have a bottle up my butt." Per family, pt was attempting to give himself a fleet enema to treat constipation and accidentally inserted the entire container. Pt is bleeding from rectum and feels a lot of rectal pain radiating to bilateral hips.

## 2022-08-20 NOTE — ED Notes (Signed)
PA and nurse at patient's bedside, digitally disimpacted large amt of soft stool. Patient tolerated procedure, no problems noted.

## 2022-08-22 ENCOUNTER — Other Ambulatory Visit: Payer: Self-pay

## 2022-08-22 ENCOUNTER — Ambulatory Visit
Admission: RE | Admit: 2022-08-22 | Discharge: 2022-08-22 | Disposition: A | Discharge status: Home / Self Care | Payer: Medicare Other | Source: Ambulatory Visit | Attending: Radiation Oncology | Admitting: Radiation Oncology

## 2022-08-22 DIAGNOSIS — C773 Secondary and unspecified malignant neoplasm of axilla and upper limb lymph nodes: Secondary | ICD-10-CM | POA: Diagnosis not present

## 2022-08-22 LAB — RAD ONC ARIA SESSION SUMMARY
Course Elapsed Days: 18
Plan Fractions Treated to Date: 13
Plan Prescribed Dose Per Fraction: 2 Gy
Plan Total Fractions Prescribed: 30
Plan Total Prescribed Dose: 60 Gy
Reference Point Dosage Given to Date: 26 Gy
Reference Point Session Dosage Given: 2 Gy
Session Number: 13

## 2022-08-23 ENCOUNTER — Ambulatory Visit: Payer: Medicare Other | Admitting: Physical Therapy

## 2022-08-23 ENCOUNTER — Ambulatory Visit
Admission: RE | Admit: 2022-08-23 | Discharge: 2022-08-23 | Disposition: A | Discharge status: Home / Self Care | Payer: Medicare Other | Source: Ambulatory Visit | Attending: Radiation Oncology | Admitting: Radiation Oncology

## 2022-08-23 ENCOUNTER — Other Ambulatory Visit: Payer: Self-pay

## 2022-08-23 DIAGNOSIS — C4492 Squamous cell carcinoma of skin, unspecified: Secondary | ICD-10-CM

## 2022-08-23 DIAGNOSIS — C773 Secondary and unspecified malignant neoplasm of axilla and upper limb lymph nodes: Secondary | ICD-10-CM

## 2022-08-23 DIAGNOSIS — I890 Lymphedema, not elsewhere classified: Secondary | ICD-10-CM

## 2022-08-23 DIAGNOSIS — M25612 Stiffness of left shoulder, not elsewhere classified: Secondary | ICD-10-CM

## 2022-08-23 LAB — RAD ONC ARIA SESSION SUMMARY
Course Elapsed Days: 19
Plan Fractions Treated to Date: 14
Plan Prescribed Dose Per Fraction: 2 Gy
Plan Total Fractions Prescribed: 30
Plan Total Prescribed Dose: 60 Gy
Reference Point Dosage Given to Date: 28 Gy
Reference Point Session Dosage Given: 2 Gy
Session Number: 14

## 2022-08-23 NOTE — Therapy (Addendum)
 OUTPATIENT PHYSICAL THERAPY  UPPER EXTREMITY ONCOLOGY TREATMENT  Patient Name: [name] MRN: [id number] DOB:04-08-1945, 77 y.o., male Today's Date: 08/23/2022  END OF SESSION:  PT End of Session - 08/23/22 1021     Visit Number 3    Number of Visits 12    Date for PT Re-Evaluation 09/21/22    PT Start Time 0945    PT Stop Time 1021    PT Time Calculation (min) 36 min    Activity Tolerance Patient limited by pain    Behavior During Therapy [facility] for tasks assessed/performed              Past Medical History:  Diagnosis Date   Anemia    thrombocytopenia   Atherosclerosis    BPH (benign prostatic hyperplasia)    Cardiomegaly    Cirrhosis of liver (HCC)    PT HAS HAD A LIVER TRANSPLANT-NASH, vaccinated for HepA/B on 05/31/11 afp 1.6, thrombus in splenic vein per ct   Coronary artery disease 2004   s/p CAGB   DM (diabetes mellitus) (HCC)    Erectile dysfunction    Esophageal varices (HCC)     EGD 05/13/10, three columns grade II to III EV s/p banding, second banding since 01/2010.>Portal gastropathy.. >5 banding sessions in the past.   GERD (gastroesophageal reflux disease)    Gynecomastia, male    RIGHT   History of transfusion of whole blood    Hyperlipidemia    Hypertension    Hypothyroidism    Overactive bladder    Spleen enlarged    Thrombocytopenia (HCC)    Thrombus    Chronic splenic, portal and smv   Past Surgical History:  Procedure Laterality Date   CARDIAC CATHETERIZATION  10/09/06   CATARACT EXTRACTION W/PHACO Left 04/20/2015   Procedure: CATARACT EXTRACTION PHACO AND INTRAOCULAR LENS PLACEMENT LEFT EYE CDE=14.92;  Surgeon: Cherene Mania, MD;  Location: [facility];  Service: Ophthalmology;  Laterality: Left;   COLONOSCOPY  01/2010   portal colopathy, sigmoid AVM (nonbleeding)   COLONOSCOPY WITH PROPOFOL  N/A 12/21/2017   Dr. Shaaron: sigmoid diverticulosis, otherwise normal   CORONARY ARTERY BYPASS GRAFT  2004   3 vessels   ESOPHAGOGASTRODUODENOSCOPY   01/2010   4 COLUMNS 2-3 GRADE ESOPHAGEAL VARICES,S/P BANDING,PORTAL GASTROPATHY   ESOPHAGOGASTRODUODENOSCOPY  June 2012   Dr. Rourk-->3 columns of grade 1 esophageal varices with overlying scar, no banding treatment required, portal gastropathy. Next EGD in December 2013   ESOPHAGOGASTRODUODENOSCOPY N/A 04/16/2012   Dr. 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) 07/25/2022   Community acquired pneumonia 06/24/2020   AKI (acute kidney injury) (HCC) 06/23/2020   Type 2 diabetes mellitus (HCC) 06/23/2020   History of transplantation, liver (HCC) 05/11/2020   Chronic combined systolic and diastolic heart failure (HCC) 10/18/2019   Nonrheumatic tricuspid valve regurgitation 05/31/2019   Pain in finger 12/20/2017   PVC (premature ventricular contraction) 12/20/2017   Pain in right knee 11/17/2017   Hypertension, essential 05/02/2016   Secondary hyperparathyroidism of renal origin (HCC) 05/02/2016   Acute urinary retention 07/11/2014   Incisional hernia without obstruction or gangrene 03/27/2014   Hyperkalemia 02/03/2014   Chronic kidney disease (CKD), stage III (moderate) (HCC) 07/02/2013   Hematospermia 05/27/2013   Hematoma of rectus sheath 02/11/2013   Portal vein  thrombosis 02/04/2013   Aftercare following organ transplant 01/28/2013   Immunosuppression (HCC) 01/28/2013   Hypertrophy of breast 09/18/2012   S/P CABG x 3 06/15/2012   Edema 08/08/2011   Vomiting 05/31/2011   Diarrhea 05/31/2011   Abdominal distention 05/31/2011   Thyroid  disease 11/07/2008   OTITIS EXTERNA, ACUTE, LEFT 11/07/2008   SEBORRHEIC KERATOSIS 05/08/2008   Acquired trigger finger 05/11/2007   Jaundice 01/08/2007   ABDOMINAL PAIN, UPPER 01/08/2007   Actinic keratosis 12/04/2006   ERECTILE  DYSFUNCTION 04/24/2006   Hyperlipidemia 02/01/2006   ANEMIA-NOS 02/01/2006   CAD (coronary artery disease) 02/01/2006   ESOPHAGEAL VARICES 02/01/2006   HEPATIC FAILURE, END STAGE 02/01/2006   CIRRHOSIS 02/01/2006   SPLENOMEGALY 02/01/2006    PCP:   REFERRING PROVIDER: Lauraine Golden, MD  REFERRING DIAG: Left axillary Mass/lymphedema  THERAPY DIAG:  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  The wrapping didn't do much but he is able to wear the tg soft. His wife says that he did want to have it rewrapped. His last radiation is July 11, 3 1/2 more weeks . Pt wife states that pt was in the ER over the weekend because he has been constipated from the pain medicine.  Pt states he does not feel the PT is making any difference.  08/17/2022 I am about half way through the radiation. My skin is still good. I tried the exercises that you gave me but I don't see any change. I didn't do them a lot. I wore the sleeve you gave me but I lost it. It didn't do anything.   EVAL Started with swelling and pain in left axillary region when he retired. Burning pain in arm pit. No numbness or tingling. Swelling started at same time as pain. Woke up one morning with pain under the arm. Presently having radiation to shrink the mass. Radiation scheduled to end September 15, 2022. He saw an orthopedic and had fluid under his armpit aspirated of 50CC, and [facility]  PERTINENT HISTORY:  Pt developed a painful  left axillary mass in January 2024 that was  determined to be a squamous cell carcinoma with  unknown primary origin. It causes pain and burning in the left UE, in addition to swelling. He is currently having pre-op radiation to decrease the size of the mass prior to surgery. Mass is presently 7.3 cm.  Pt is s/p Liver transplant 10 years ago and is on immunosupression. He also has stage III CK DM, CABG.  PAIN:  Are you having pain? Yes NPRS scale: 9/10 with pain meds Pain location: left armpit Pain orientation: Left  PAIN TYPE: burning and sharp Pain description: constant  Aggravating factors: not sure Relieving factors: pain meds a little, nothing else eases pt seems to like having a little pillow back under   PRECAUTIONS: post liver transplant with immuno-suppresion 10 years ago, CKD, CABG 2004, DM, thyroid , RTC repair on left  25 yrs. ago  WEIGHT BEARING RESTRICTIONS:   FALLS:  Has patient fallen in last 6 months? Yes. Number of falls 1  LIVING ENVIRONMENT: Lives with: lives with their spouse Lives in: House/apartment   OCCUPATION: retired  LEISURE: not much since new diagnosis  HAND DOMINANCE: left   PRIOR LEVEL OF FUNCTION: Independent  PATIENT GOALS: get use and movement in arm,be able to handle daily activities,   OBJECTIVE:  COGNITION: Overall cognitive status: Within functional limits for tasks assessed   PALPATION: Firm mass palpable in left axilla  OBSERVATIONS / OTHER ASSESSMENTS: no skin irritation from radiation, shoulder is tight at end ranges of motion  SENSATION: Light touch: Appears intact   POSTURE: forward head, rounded shoulders  UPPER EXTREMITY AROM/PROM:  A/PROM RIGHT   eval   Shoulder extension 62  Shoulder flexion 135  Shoulder abduction 157  Shoulder internal rotation 62  Shoulder external rotation 80    (Blank rows = not tested)  A/PROM LEFT   eval  Shoulder extension 42  Shoulder flexion 65  Shoulder abduction 74  Shoulder  internal rotation   Shoulder external rotation     (Blank rows = not tested)  CERVICAL AROM: All within functional limits:     UPPER EXTREMITY STRENGTH:   LYMPHEDEMA ASSESSMENTS:   SURGERY TYPE/DATE: pending left axillary Mass removal  NUMBER OF LYMPH NODES REMOVED: NA  CHEMOTHERAPY: NO  RADIATION:presently having radiation to shrink mass before surgery  HORMONE TREATMENT: NA  INFECTIONS: NO   LYMPHEDEMA ASSESSMENTS:   LANDMARK RIGHT  eval  At axilla  27.7  15 cm proximal to olecranon process 25.0  10 cm proximal to olecranon process 23.5  Olecranon process 24.5  15 cm proximal to ulnar styloid process 22.3  10 cm proximal to ulnar styloid process 20.0  Just proximal to ulnar styloid process 15.7  Across hand at thumb web space 20.3  At base of 2nd digit 6.4  (Blank rows = not tested)  LANDMARK LEFT  eval  At axilla  30.7  15 cm proximal to olecranon process 28.0  10 cm proximal to olecranon process 26.4  Olecranon process 31.1  15 cm proximal to ulnar styloid process 26.8  10 cm proximal to ulnar styloid process 23.5  Just proximal to ulnar styloid process 18.7  Across hand at thumb web space 20.6  At base of 2nd digit 6.8  (Blank rows = not tested)    L-DEX LYMPHEDEMA SCREENING: The patient was assessed using the L-Dex machine today to produce a lymphedema index baseline score. The patient will be reassessed on a regular basis (typically every 3 months) to obtain new L-Dex scores. If the score is > 6.5 points away from his/her baseline score  indicating onset of subclinical lymphedema, it will be recommended to wear a compression garment for 4 weeks, 12 hours per day and then be reassessed. If the score continues to be > 6.5 points from baseline at reassessment, we will initiate lymphedema treatment. Assessing in this manner has a 95% rate of preventing clinically significant lymphedema.   Lymphedema Life Impact Scale :  78%  TODAY'S TREATMENT:   08/23/22 Pt was given a dog bone pillow to place in his axilla and finds it to be very helpful to control his pain to a degree that he prefers to keep it there. He was able to do a few reps of neck ROM, deep breathing with encouragement to get to diaphragmatic breathing. From sitting in chair, with dogbone pillow under axilla and both forearms supported on pillow on his lap, he was able to do elbow flexion and extension, then with elbows on armrests and hands elevated, wrist and finger flexion and extensions.  He was able to extend left arm down at side and do elbow extension and flexion. Pt did 10 reps of right arm raises.  In standing, pt was encouraged to do Codmans dependent shoulder circlex.  He was instructed to do standing exercise once and hour (during TV program change) for heel raises, marches, mini squats, side steps and whatever else he can do to be active.  Talked with pt about whether they want to continue PT at this time or wait until he is better able to tolerate it. They will call back if they want to cancel appts for now.   08/17/22 Pts wife instructed in compression bandaging with therapist performing the first finger wrap, hand wrap and arm wrap and then removing each and having his wife perform. Small TG soft cut for Left UE, Finger wrap to fingers 1-5 with Mollelast  under TG soft with TG pulled over. Artiflex wrist to axilla. 6 cm Comprilan wrap at hand, 2 12 cm wraps wrist to axilla. Pts wife did an excellent job with occasional VC's throughout. Pt was shown how to exercise his wrist and elbow and hand to loosen the wraps. Printed video for wrapping technique, and gave bandaging instructions with pictures, and verbage when to remove wraps etc. Discussed removing wraps if pt has increased pain, numbness etc. Also suggested she can remove top wrap if it bothers him first to see if he can accommodate to it.  She will rewrap prn every day or 2 unless pt has poor tolerance or arm does not seem  to be improving at all.                                                                                                                                           DATE: 08/10/2022 PROM left shoulder flexion, scaption ,abd  and ER with multiple VC's to get pt to relax Cut small TG soft for  pts left UE to see if he can tolerate mild compression. Educated pt to remove if having pain, tingling anything uncomfortable or different from what he is already experiencing.   PATIENT EDUCATION:  Education details: clasped hands flexion for ROM, wear TG soft if comfortable but remove if increased pain, numbness etc Person educated: Patient and Spouse Education method: Programmer, Multimedia, Demonstration, Verbal cues, and Handouts Education comprehension: verbalized understanding, returned demonstration, and needs further education  HOME EXERCISE PROGRAM: Supine clasped hands flexion, scapular retraction   ASSESSMENT:  CLINICAL IMPRESSION: Pt continues to be in excurciating pain  with everything.  He is not able to tolerate any motion at his shoulder and is developing an intolerance to the pain med.  He was instructed in doing exercise with other joints today and to keep generally active as he cannot tolerate exercise to his his left shoulder. He is not able to tolerate compression bandaging to his left arm, but he is able to tolerate the tg soft, more was issued today. He agreed to continue positioning for comfort with dogbone pillow under left axilla and other mobility to stay active. He will call about future appointements.     OBJECTIVE IMPAIRMENTS: decreased activity tolerance, decreased knowledge of condition, decreased ROM, decreased strength, increased edema, postural dysfunction, and pain.   ACTIVITY LIMITATIONS: carrying, lifting, sleeping, bed mobility, dressing, and reach over head  PARTICIPATION LIMITATIONS: cleaning, driving, and yard work  PERSONAL FACTORS: 3+ comorbidities: axillary tumor,  prior liver transplant, DM are also affecting patient's functional outcome.   REHAB POTENTIAL: Good  CLINICAL DECISION MAKING: Evolving/moderate complexity  EVALUATION COMPLEXITY: Low  GOALS: Goals reviewed with patient? Yes  SHORT TERM GOALS: Target date: 3 weeks  Pt will be independent in HEP for shoulder ROM Baseline: Goal status: INITIAL  2.  Pt. Will be tolerant of compression bandaging Baseline:  Goal status: INITIAL  3.  Pts wife will be educated and independent in compression bandaging to left UE Baseline:  Goal status: INITIAL  4.  Pt will have improved shoulder flexion and abduction AROM by atleast 25 degrees Baseline:  Goal status: INITIAL  5.  Pt will have decreased edema by 1.5 cm at 10 cm prox to ulna and 10 cm prox to olecranon Baseline:  Goal status: INITIAL  LONG TERM GOALS: Target date: 6 weeks  Pt will have decreased swelling at olecranon by 3 cm Baseline:  Goal status: INITIAL  2.  Pt will be fit for compression sleeve prn and tolerated Baseline:  Goal status: INITIAL  3.  Pt will improve left shoulder flexion and abd to atleast 120 degrees Baseline:  Goal status: INITIAL  PLAN:  PT FREQUENCY: 2x/week  PT DURATION: 6 weeks  PLANNED INTERVENTIONS: Therapeutic exercises, Therapeutic activity, Patient/Family education, Self Care, Joint mobilization, Orthotic/Fit training, Compression bandaging, Manual therapy, and Re-evaluation  PLAN FOR NEXT SESSION: AAROM as tolerated, GH mobs, PROM, review with wife  compression bandaging if he tolerates , Compression garment prn if he responds to wrapping. No MLD Release to HEP and self management when ready   PHYSICAL THERAPY DISCHARGE SUMMARY  Visits from Start of Care: 3  Current functional level related to goals / functional outcomes: Unknown. Did not return but did not appear to be helping   Remaining deficits: As evaluation   Education / Equipment: HEP, wrapping techniques   Patient  agrees to discharge. Patient goals were not met. Patient is being discharged due to not returning since the last visit.  Verneita POUR. Delores,  PT  Delores Verneita Armor, PT 08/23/2022, 10:35 [facility] [address] [place], [place], [address] Phone: [phone]   Fax:  [phone]  Patient Details  Name: [name] MRN: [id number] Date of Birth: 1945-09-04 Referring Provider:  Izell Domino, MD  Encounter Date: 08/23/2022   Delores Verneita Armor, PT 08/23/2022, 10:35 AM  [facility] [address] [place], [place], [address] Phone: [phone]   Fax:  [phone]

## 2022-08-24 ENCOUNTER — Other Ambulatory Visit: Payer: Self-pay

## 2022-08-24 ENCOUNTER — Ambulatory Visit
Admission: RE | Admit: 2022-08-24 | Discharge: 2022-08-24 | Disposition: A | Discharge status: Home / Self Care | Payer: Medicare Other | Source: Ambulatory Visit | Attending: Radiation Oncology | Admitting: Radiation Oncology

## 2022-08-24 DIAGNOSIS — C773 Secondary and unspecified malignant neoplasm of axilla and upper limb lymph nodes: Secondary | ICD-10-CM | POA: Diagnosis not present

## 2022-08-24 LAB — RAD ONC ARIA SESSION SUMMARY
Course Elapsed Days: 20
Plan Fractions Treated to Date: 15
Plan Prescribed Dose Per Fraction: 2 Gy
Plan Total Fractions Prescribed: 30
Plan Total Prescribed Dose: 60 Gy
Reference Point Dosage Given to Date: 30 Gy
Reference Point Session Dosage Given: 2 Gy
Session Number: 15

## 2022-08-25 ENCOUNTER — Other Ambulatory Visit: Payer: Self-pay

## 2022-08-25 ENCOUNTER — Ambulatory Visit
Admission: RE | Admit: 2022-08-25 | Discharge: 2022-08-25 | Disposition: A | Discharge status: Home / Self Care | Payer: Medicare Other | Source: Ambulatory Visit | Attending: Radiation Oncology | Admitting: Radiation Oncology

## 2022-08-25 ENCOUNTER — Encounter: Payer: Medicare Other | Admitting: Physical Therapy

## 2022-08-25 DIAGNOSIS — C773 Secondary and unspecified malignant neoplasm of axilla and upper limb lymph nodes: Secondary | ICD-10-CM | POA: Diagnosis not present

## 2022-08-25 LAB — RAD ONC ARIA SESSION SUMMARY
Course Elapsed Days: 21
Plan Fractions Treated to Date: 16
Plan Prescribed Dose Per Fraction: 2 Gy
Plan Total Fractions Prescribed: 30
Plan Total Prescribed Dose: 60 Gy
Reference Point Dosage Given to Date: 32 Gy
Reference Point Session Dosage Given: 2 Gy
Session Number: 16

## 2022-08-26 ENCOUNTER — Ambulatory Visit (HOSPITAL_BASED_OUTPATIENT_CLINIC_OR_DEPARTMENT_OTHER)
Admission: RE | Admit: 2022-08-26 | Discharge: 2022-08-26 | Disposition: A | Discharge status: Home / Self Care | Payer: Medicare Other | Source: Ambulatory Visit | Attending: General Surgery | Admitting: General Surgery

## 2022-08-26 ENCOUNTER — Other Ambulatory Visit: Payer: Self-pay

## 2022-08-26 ENCOUNTER — Other Ambulatory Visit (HOSPITAL_COMMUNITY): Payer: Self-pay | Admitting: General Surgery

## 2022-08-26 ENCOUNTER — Ambulatory Visit
Admission: RE | Admit: 2022-08-26 | Discharge: 2022-08-26 | Disposition: A | Discharge status: Home / Self Care | Payer: Medicare Other | Source: Ambulatory Visit | Attending: Radiation Oncology | Admitting: Radiation Oncology

## 2022-08-26 DIAGNOSIS — R2232 Localized swelling, mass and lump, left upper limb: Secondary | ICD-10-CM

## 2022-08-26 DIAGNOSIS — C773 Secondary and unspecified malignant neoplasm of axilla and upper limb lymph nodes: Secondary | ICD-10-CM | POA: Diagnosis not present

## 2022-08-26 DIAGNOSIS — C779 Secondary and unspecified malignant neoplasm of lymph node, unspecified: Secondary | ICD-10-CM

## 2022-08-26 LAB — RAD ONC ARIA SESSION SUMMARY
Course Elapsed Days: 22
Plan Fractions Treated to Date: 17
Plan Prescribed Dose Per Fraction: 2 Gy
Plan Total Fractions Prescribed: 30
Plan Total Prescribed Dose: 60 Gy
Reference Point Dosage Given to Date: 34 Gy
Reference Point Session Dosage Given: 2 Gy
Session Number: 17

## 2022-08-26 MED ORDER — IOHEXOL 350 MG/ML SOLN
100.0000 mL | Freq: Once | INTRAVENOUS | Status: AC | PRN
  Administered 2022-08-26: 80 mL via INTRAVENOUS

## 2022-08-27 LAB — POCT I-STAT CREATININE: Creatinine, Ser: 1.6 mg/dL — ABNORMAL HIGH (ref 0.61–1.24)

## 2022-08-29 ENCOUNTER — Other Ambulatory Visit: Payer: Self-pay

## 2022-08-29 ENCOUNTER — Ambulatory Visit
Admission: RE | Admit: 2022-08-29 | Discharge: 2022-08-29 | Disposition: A | Discharge status: Home / Self Care | Payer: Medicare Other | Source: Ambulatory Visit | Attending: Radiation Oncology | Admitting: Radiation Oncology

## 2022-08-29 ENCOUNTER — Encounter: Payer: Medicare Other | Admitting: Rehabilitation

## 2022-08-29 DIAGNOSIS — C773 Secondary and unspecified malignant neoplasm of axilla and upper limb lymph nodes: Secondary | ICD-10-CM | POA: Diagnosis not present

## 2022-08-29 LAB — RAD ONC ARIA SESSION SUMMARY
Course Elapsed Days: 25
Plan Fractions Treated to Date: 18
Plan Prescribed Dose Per Fraction: 2 Gy
Plan Total Fractions Prescribed: 30
Plan Total Prescribed Dose: 60 Gy
Reference Point Dosage Given to Date: 36 Gy
Reference Point Session Dosage Given: 2 Gy
Session Number: 18

## 2022-08-30 ENCOUNTER — Ambulatory Visit
Admission: RE | Admit: 2022-08-30 | Discharge: 2022-08-30 | Disposition: A | Discharge status: Home / Self Care | Payer: Medicare Other | Source: Ambulatory Visit | Attending: Radiation Oncology | Admitting: Radiation Oncology

## 2022-08-30 ENCOUNTER — Other Ambulatory Visit: Payer: Self-pay

## 2022-08-30 DIAGNOSIS — C773 Secondary and unspecified malignant neoplasm of axilla and upper limb lymph nodes: Secondary | ICD-10-CM | POA: Diagnosis not present

## 2022-08-30 LAB — RAD ONC ARIA SESSION SUMMARY
Course Elapsed Days: 26
Plan Fractions Treated to Date: 19
Plan Prescribed Dose Per Fraction: 2 Gy
Plan Total Fractions Prescribed: 30
Plan Total Prescribed Dose: 60 Gy
Reference Point Dosage Given to Date: 38 Gy
Reference Point Session Dosage Given: 2 Gy
Session Number: 19

## 2022-08-31 ENCOUNTER — Ambulatory Visit
Admission: RE | Admit: 2022-08-31 | Discharge: 2022-08-31 | Disposition: A | Discharge status: Home / Self Care | Payer: Medicare Other | Source: Ambulatory Visit | Attending: Radiation Oncology | Admitting: Radiation Oncology

## 2022-08-31 ENCOUNTER — Other Ambulatory Visit: Payer: Self-pay

## 2022-08-31 DIAGNOSIS — C773 Secondary and unspecified malignant neoplasm of axilla and upper limb lymph nodes: Secondary | ICD-10-CM | POA: Diagnosis not present

## 2022-08-31 LAB — RAD ONC ARIA SESSION SUMMARY
Course Elapsed Days: 27
Plan Fractions Treated to Date: 20
Plan Prescribed Dose Per Fraction: 2 Gy
Plan Total Fractions Prescribed: 30
Plan Total Prescribed Dose: 60 Gy
Reference Point Dosage Given to Date: 40 Gy
Reference Point Session Dosage Given: 2 Gy
Session Number: 20

## 2022-08-31 NOTE — Progress Notes (Signed)
Referral placed for [facility] oncology per Dr. Basilio Cairo request. Talbert Forest will fax referral to [facility] for urgent planning.

## 2022-09-01 ENCOUNTER — Other Ambulatory Visit: Payer: Self-pay

## 2022-09-01 ENCOUNTER — Ambulatory Visit
Admission: RE | Admit: 2022-09-01 | Discharge: 2022-09-01 | Disposition: A | Discharge status: Home / Self Care | Payer: Medicare Other | Source: Ambulatory Visit | Attending: Radiation Oncology | Admitting: Radiation Oncology

## 2022-09-01 DIAGNOSIS — C773 Secondary and unspecified malignant neoplasm of axilla and upper limb lymph nodes: Secondary | ICD-10-CM | POA: Diagnosis not present

## 2022-09-01 LAB — RAD ONC ARIA SESSION SUMMARY
Course Elapsed Days: 28
Plan Fractions Treated to Date: 21
Plan Prescribed Dose Per Fraction: 2 Gy
Plan Total Fractions Prescribed: 30
Plan Total Prescribed Dose: 60 Gy
Reference Point Dosage Given to Date: 42 Gy
Reference Point Session Dosage Given: 2 Gy
Session Number: 21

## 2022-09-02 ENCOUNTER — Telehealth: Payer: Self-pay

## 2022-09-02 ENCOUNTER — Other Ambulatory Visit: Payer: Self-pay | Admitting: Physician Assistant

## 2022-09-02 ENCOUNTER — Other Ambulatory Visit: Payer: Self-pay

## 2022-09-02 ENCOUNTER — Ambulatory Visit
Admission: RE | Admit: 2022-09-02 | Discharge: 2022-09-02 | Disposition: A | Discharge status: Home / Self Care | Payer: Medicare Other | Source: Ambulatory Visit | Attending: Radiation Oncology | Admitting: Radiation Oncology

## 2022-09-02 DIAGNOSIS — C773 Secondary and unspecified malignant neoplasm of axilla and upper limb lymph nodes: Secondary | ICD-10-CM | POA: Diagnosis not present

## 2022-09-02 LAB — RAD ONC ARIA SESSION SUMMARY
Course Elapsed Days: 29
Plan Fractions Treated to Date: 22
Plan Prescribed Dose Per Fraction: 2 Gy
Plan Total Fractions Prescribed: 30
Plan Total Prescribed Dose: 60 Gy
Reference Point Dosage Given to Date: 44 Gy
Reference Point Session Dosage Given: 2 Gy
Session Number: 22

## 2022-09-02 MED ORDER — OXYCODONE HCL 10 MG PO TABS: 10.0000 mg | ORAL_TABLET | Freq: Four times a day (QID) | ORAL | 0 refills | Status: AC | PRN

## 2022-09-02 MED ORDER — MORPHINE SULFATE ER 15 MG PO TBCR: 15.0000 mg | EXTENDED_RELEASE_TABLET | Freq: Two times a day (BID) | ORAL | 0 refills | Status: AC

## 2022-09-02 NOTE — Telephone Encounter (Signed)
can you call patient to triage his pain control with oxycodone  if he needs something stronger let me know. IT  spoke with pt and oxycodone is not controlling his pain. He agreed to getting something stronger  RX for MS contin sent and pt advised to take twice a day and oxycodone every 6 hrs as needed for breakthrough pain.  Pt agreed to plan

## 2022-09-05 ENCOUNTER — Ambulatory Visit
Admission: RE | Admit: 2022-09-05 | Discharge: 2022-09-05 | Disposition: A | Discharge status: Home / Self Care | Payer: Medicare Other | Source: Ambulatory Visit | Attending: Radiation Oncology | Admitting: Radiation Oncology

## 2022-09-05 ENCOUNTER — Telehealth: Payer: Self-pay

## 2022-09-05 ENCOUNTER — Other Ambulatory Visit: Payer: Self-pay

## 2022-09-05 DIAGNOSIS — R2232 Localized swelling, mass and lump, left upper limb: Secondary | ICD-10-CM | POA: Insufficient documentation

## 2022-09-05 DIAGNOSIS — C779 Secondary and unspecified malignant neoplasm of lymph node, unspecified: Secondary | ICD-10-CM | POA: Diagnosis present

## 2022-09-05 DIAGNOSIS — C773 Secondary and unspecified malignant neoplasm of axilla and upper limb lymph nodes: Secondary | ICD-10-CM | POA: Diagnosis present

## 2022-09-05 LAB — RAD ONC ARIA SESSION SUMMARY
Course Elapsed Days: 32
Plan Fractions Treated to Date: 23
Plan Prescribed Dose Per Fraction: 2 Gy
Plan Total Fractions Prescribed: 30
Plan Total Prescribed Dose: 60 Gy
Reference Point Dosage Given to Date: 46 Gy
Reference Point Session Dosage Given: 2 Gy
Session Number: 23

## 2022-09-05 NOTE — Telephone Encounter (Signed)
Rn called pt wife back after she called RN (someone had called from our number). Rn could not track down who might have called and no phone note was written on the attempt. Rn encouraged pt wife who ever called would call them back later.

## 2022-09-06 ENCOUNTER — Telehealth: Payer: Self-pay | Admitting: Radiation Oncology

## 2022-09-06 ENCOUNTER — Telehealth: Payer: Self-pay

## 2022-09-06 ENCOUNTER — Ambulatory Visit
Admission: RE | Admit: 2022-09-06 | Discharge: 2022-09-06 | Disposition: A | Discharge status: Home / Self Care | Payer: Medicare Other | Source: Ambulatory Visit | Attending: Radiation Oncology | Admitting: Radiation Oncology

## 2022-09-06 ENCOUNTER — Encounter: Payer: Self-pay | Admitting: Radiation Oncology

## 2022-09-06 ENCOUNTER — Other Ambulatory Visit: Payer: Self-pay

## 2022-09-06 DIAGNOSIS — C773 Secondary and unspecified malignant neoplasm of axilla and upper limb lymph nodes: Secondary | ICD-10-CM | POA: Diagnosis not present

## 2022-09-06 LAB — RAD ONC ARIA SESSION SUMMARY
Course Elapsed Days: 33
Plan Fractions Treated to Date: 24
Plan Prescribed Dose Per Fraction: 2 Gy
Plan Total Fractions Prescribed: 30
Plan Total Prescribed Dose: 60 Gy
Reference Point Dosage Given to Date: 48 Gy
Reference Point Session Dosage Given: 2 Gy
Session Number: 24

## 2022-09-06 NOTE — Progress Notes (Signed)
Over the past 24 hours I reached out to a few of my colleagues at [facility] [employer] to push for an urgent medical oncology consultation for Mr. Mcclish.  I then received word from the team at [facility] that when they contacted the patient to arrange a consultation, they were informed that he had decided to pursue palliative care/comfort measures instead.    I spoke this afternoon with the patient's wife and she confirmed that they no longer wish to consider drug therapy;they have been referred to palliative care per their wishes.  They want to pursue comfort measures only and stop his radiation therapy.  They are firm in their decision and I think it is reasonable.  They know to contact Dr. Donell Beers if he ends up having a delayed response in his tumor and wants to be be considered for surgical resection.  They would like to see me back on an as-needed basis.  We will cancel his upcoming appointments in our department.  She expressed gratitude for the care that they received here and I wished them the very best in this difficult time. -----------------------------------  Lonie Peak, MD

## 2022-09-06 NOTE — Telephone Encounter (Signed)
We received a call from patient's spouse, she has stated that he would like to cancel all future treatment appointments. Dr. Basilio Cairo and L3 notified.

## 2022-09-06 NOTE — Telephone Encounter (Signed)
Rn called pt wife to let her know Dr. Basilio Cairo has personally reached out to several providers at [facility] ok their behalf. Rn also told pt wife if they have not heard anything by Friday to reach out to [place] and we would re contact the team at [facility]. Pt wife thought that Dr. Basilio Cairo wanted to see pt on Friday morning anyway based on conversation yesterday. Rn will inquire about this visit Friday with Dr. Basilio Cairo and place under treat visit if needed.

## 2022-09-07 ENCOUNTER — Ambulatory Visit: Payer: Medicare Other

## 2022-09-09 ENCOUNTER — Ambulatory Visit: Payer: Medicare Other

## 2022-09-09 ENCOUNTER — Encounter: Payer: Self-pay | Admitting: Physician Assistant

## 2022-09-09 NOTE — Radiation Completion Notes (Signed)
Patient Name: [name], [name] MRN: [id number] Date of Birth: 1945-08-02 Referring Physician: Almond Lint, M.D. Date of Service: 2022-09-09 Radiation Oncologist: Lonie Peak, M.D. [facility]                             RADIATION ONCOLOGY END OF TREATMENT NOTE     Diagnosis: C77.3 Secondary and unspecified malignant neoplasm of axilla and upper limb lymph nodes Intent: Curative     ==========DELIVERED PLANS==========  First Treatment Date: 2022-07-27 - Last Treatment Date: 2022-09-06   Plan Name: Chest_L_Axil Site: Thorax Technique: 3D Mode: Photon Dose Per Fraction: 2 Gy Prescribed Dose (Delivered / Prescribed): 48 Gy / 60 Gy Prescribed Fxs (Delivered / Prescribed): 24 / 30     ==========ON TREATMENT VISIT DATES========== 2022-08-08, 2022-08-12, 2022-08-22, 2022-08-29, 2022-09-05     ==========UPCOMING VISITS==========       ==========APPENDIX - ON TREATMENT VISIT NOTES==========   See weekly On Treatment Notes in Epic for details.

## 2022-09-12 ENCOUNTER — Ambulatory Visit: Payer: Medicare Other

## 2022-09-13 ENCOUNTER — Ambulatory Visit: Payer: Medicare Other

## 2022-09-14 ENCOUNTER — Ambulatory Visit: Payer: Medicare Other

## 2022-09-15 ENCOUNTER — Ambulatory Visit: Payer: Medicare Other

## 2022-10-06 DEATH — deceased
